# Patient Record
Sex: Male | Born: 1950 | Race: Black or African American | Hispanic: No | Marital: Single | Smoking: Former smoker
Health system: Southern US, Community
[De-identification: ages and names within clinical notes are randomized; demographics above are authoritative.]

## PROBLEM LIST (undated history)

## (undated) DIAGNOSIS — I10 Essential (primary) hypertension: Secondary | ICD-10-CM

## (undated) DIAGNOSIS — K219 Gastro-esophageal reflux disease without esophagitis: Secondary | ICD-10-CM

## (undated) DIAGNOSIS — M1990 Unspecified osteoarthritis, unspecified site: Secondary | ICD-10-CM

## (undated) DIAGNOSIS — I442 Atrioventricular block, complete: Secondary | ICD-10-CM

## (undated) DIAGNOSIS — I255 Ischemic cardiomyopathy: Secondary | ICD-10-CM

## (undated) DIAGNOSIS — E785 Hyperlipidemia, unspecified: Secondary | ICD-10-CM

## (undated) DIAGNOSIS — M549 Dorsalgia, unspecified: Secondary | ICD-10-CM

## (undated) DIAGNOSIS — I2510 Atherosclerotic heart disease of native coronary artery without angina pectoris: Secondary | ICD-10-CM

## (undated) DIAGNOSIS — I4729 Other ventricular tachycardia: Secondary | ICD-10-CM

## (undated) DIAGNOSIS — M5430 Sciatica, unspecified side: Secondary | ICD-10-CM

## (undated) DIAGNOSIS — G8929 Other chronic pain: Secondary | ICD-10-CM

## (undated) DIAGNOSIS — I472 Ventricular tachycardia: Secondary | ICD-10-CM

## (undated) HISTORY — DX: Ischemic cardiomyopathy: I25.5

## (undated) HISTORY — DX: Unspecified osteoarthritis, unspecified site: M19.90

## (undated) HISTORY — DX: Ventricular tachycardia: I47.2

## (undated) HISTORY — DX: Other ventricular tachycardia: I47.29

## (undated) HISTORY — DX: Atherosclerotic heart disease of native coronary artery without angina pectoris: I25.10

## (undated) HISTORY — PX: IMPLANTABLE CARDIOVERTER DEFIBRILLATOR IMPLANT: SHX5860

## (undated) HISTORY — PX: CORONARY ANGIOPLASTY WITH STENT PLACEMENT: SHX49

## (undated) HISTORY — DX: Dorsalgia, unspecified: M54.9

## (undated) HISTORY — PX: GALLBLADDER SURGERY: SHX652

## (undated) HISTORY — DX: Gastro-esophageal reflux disease without esophagitis: K21.9

## (undated) HISTORY — DX: Atrioventricular block, complete: I44.2

## (undated) HISTORY — DX: Hyperlipidemia, unspecified: E78.5

## (undated) HISTORY — DX: Sciatica, unspecified side: M54.30

## (undated) HISTORY — PX: CARDIAC DEFIBRILLATOR PLACEMENT: SHX171

## (undated) HISTORY — DX: Other chronic pain: G89.29

---

## 1997-07-10 ENCOUNTER — Emergency Department (HOSPITAL_COMMUNITY): Admission: EM | Admit: 1997-07-10 | Discharge: 1997-07-10 | Payer: Self-pay | Admitting: Emergency Medicine

## 1997-12-28 ENCOUNTER — Observation Stay (HOSPITAL_COMMUNITY): Admission: EM | Admit: 1997-12-28 | Discharge: 1997-12-29 | Payer: Self-pay | Admitting: Emergency Medicine

## 1997-12-28 ENCOUNTER — Encounter: Payer: Self-pay | Admitting: Emergency Medicine

## 1997-12-28 ENCOUNTER — Encounter: Payer: Self-pay | Admitting: *Deleted

## 1997-12-29 ENCOUNTER — Encounter: Payer: Self-pay | Admitting: *Deleted

## 1997-12-29 ENCOUNTER — Encounter: Payer: Self-pay | Admitting: Cardiology

## 1998-03-23 ENCOUNTER — Ambulatory Visit (HOSPITAL_COMMUNITY): Admission: RE | Admit: 1998-03-23 | Discharge: 1998-03-23 | Payer: Self-pay | Admitting: Cardiology

## 1999-03-30 ENCOUNTER — Ambulatory Visit (HOSPITAL_COMMUNITY): Admission: RE | Admit: 1999-03-30 | Discharge: 1999-03-30 | Payer: Self-pay | Admitting: Cardiology

## 1999-03-30 ENCOUNTER — Encounter: Payer: Self-pay | Admitting: Cardiology

## 1999-05-17 ENCOUNTER — Emergency Department (HOSPITAL_COMMUNITY): Admission: EM | Admit: 1999-05-17 | Discharge: 1999-05-17 | Payer: Self-pay | Admitting: Emergency Medicine

## 1999-05-18 ENCOUNTER — Emergency Department (HOSPITAL_COMMUNITY): Admission: EM | Admit: 1999-05-18 | Discharge: 1999-05-18 | Payer: Self-pay | Admitting: Emergency Medicine

## 1999-05-19 ENCOUNTER — Encounter: Payer: Self-pay | Admitting: Cardiology

## 1999-05-19 ENCOUNTER — Ambulatory Visit (HOSPITAL_COMMUNITY): Admission: RE | Admit: 1999-05-19 | Discharge: 1999-05-19 | Payer: Self-pay | Admitting: Cardiology

## 1999-07-09 ENCOUNTER — Encounter: Payer: Self-pay | Admitting: Internal Medicine

## 1999-07-09 ENCOUNTER — Ambulatory Visit (HOSPITAL_COMMUNITY): Admission: RE | Admit: 1999-07-09 | Discharge: 1999-07-09 | Payer: Self-pay | Admitting: Internal Medicine

## 1999-11-12 ENCOUNTER — Ambulatory Visit (HOSPITAL_COMMUNITY): Admission: RE | Admit: 1999-11-12 | Discharge: 1999-11-12 | Payer: Self-pay | Admitting: Cardiology

## 1999-11-12 ENCOUNTER — Encounter: Payer: Self-pay | Admitting: Cardiology

## 1999-11-23 ENCOUNTER — Ambulatory Visit (HOSPITAL_COMMUNITY): Admission: RE | Admit: 1999-11-23 | Discharge: 1999-11-23 | Payer: Self-pay | Admitting: Cardiology

## 2000-01-19 ENCOUNTER — Ambulatory Visit (HOSPITAL_COMMUNITY): Admission: RE | Admit: 2000-01-19 | Discharge: 2000-01-19 | Payer: Self-pay | Admitting: Cardiology

## 2000-04-06 ENCOUNTER — Encounter: Payer: Self-pay | Admitting: Pulmonary Disease

## 2000-04-06 ENCOUNTER — Ambulatory Visit (HOSPITAL_COMMUNITY): Admission: RE | Admit: 2000-04-06 | Discharge: 2000-04-06 | Payer: Self-pay | Admitting: Pulmonary Disease

## 2000-04-12 ENCOUNTER — Encounter: Payer: Self-pay | Admitting: Cardiology

## 2000-04-12 ENCOUNTER — Ambulatory Visit (HOSPITAL_COMMUNITY): Admission: RE | Admit: 2000-04-12 | Discharge: 2000-04-12 | Payer: Self-pay | Admitting: Cardiology

## 2000-05-10 ENCOUNTER — Encounter: Payer: Self-pay | Admitting: *Deleted

## 2000-05-10 ENCOUNTER — Encounter: Admission: RE | Admit: 2000-05-10 | Discharge: 2000-05-10 | Payer: Self-pay | Admitting: *Deleted

## 2000-06-02 ENCOUNTER — Ambulatory Visit: Admission: RE | Admit: 2000-06-02 | Discharge: 2000-06-02 | Payer: Self-pay | Admitting: Pulmonary Disease

## 2000-08-15 ENCOUNTER — Inpatient Hospital Stay (HOSPITAL_COMMUNITY): Admission: EM | Admit: 2000-08-15 | Discharge: 2000-08-22 | Payer: Self-pay | Admitting: Emergency Medicine

## 2000-08-15 ENCOUNTER — Encounter: Payer: Self-pay | Admitting: Emergency Medicine

## 2000-08-16 ENCOUNTER — Encounter: Payer: Self-pay | Admitting: Cardiology

## 2000-08-17 ENCOUNTER — Encounter: Payer: Self-pay | Admitting: Cardiology

## 2000-08-21 ENCOUNTER — Encounter: Payer: Self-pay | Admitting: Cardiology

## 2001-08-29 ENCOUNTER — Ambulatory Visit (HOSPITAL_COMMUNITY): Admission: RE | Admit: 2001-08-29 | Discharge: 2001-08-29 | Payer: Self-pay | Admitting: Cardiology

## 2001-08-29 ENCOUNTER — Encounter: Payer: Self-pay | Admitting: Cardiology

## 2001-09-14 ENCOUNTER — Encounter: Payer: Self-pay | Admitting: General Surgery

## 2001-09-14 ENCOUNTER — Encounter: Payer: Self-pay | Admitting: Emergency Medicine

## 2001-09-14 ENCOUNTER — Inpatient Hospital Stay (HOSPITAL_COMMUNITY): Admission: EM | Admit: 2001-09-14 | Discharge: 2001-09-19 | Payer: Self-pay | Admitting: Emergency Medicine

## 2001-09-15 ENCOUNTER — Encounter: Payer: Self-pay | Admitting: General Surgery

## 2001-09-17 ENCOUNTER — Encounter: Payer: Self-pay | Admitting: Cardiology

## 2001-09-27 ENCOUNTER — Ambulatory Visit (HOSPITAL_COMMUNITY): Admission: RE | Admit: 2001-09-27 | Discharge: 2001-09-27 | Payer: Self-pay | Admitting: Cardiology

## 2001-10-01 ENCOUNTER — Ambulatory Visit (HOSPITAL_COMMUNITY): Admission: RE | Admit: 2001-10-01 | Discharge: 2001-10-01 | Payer: Self-pay | Admitting: Cardiology

## 2001-10-01 ENCOUNTER — Encounter: Payer: Self-pay | Admitting: Cardiology

## 2001-10-17 ENCOUNTER — Ambulatory Visit (HOSPITAL_COMMUNITY): Admission: RE | Admit: 2001-10-17 | Discharge: 2001-10-17 | Payer: Self-pay | Admitting: *Deleted

## 2001-11-08 ENCOUNTER — Encounter: Payer: Self-pay | Admitting: General Surgery

## 2001-11-08 ENCOUNTER — Ambulatory Visit (HOSPITAL_COMMUNITY): Admission: RE | Admit: 2001-11-08 | Discharge: 2001-11-08 | Payer: Self-pay | Admitting: General Surgery

## 2001-11-12 ENCOUNTER — Ambulatory Visit (HOSPITAL_COMMUNITY): Admission: RE | Admit: 2001-11-12 | Discharge: 2001-11-12 | Payer: Self-pay | Admitting: General Surgery

## 2001-11-12 ENCOUNTER — Encounter: Payer: Self-pay | Admitting: General Surgery

## 2001-12-12 ENCOUNTER — Encounter: Payer: Self-pay | Admitting: General Surgery

## 2001-12-17 ENCOUNTER — Encounter: Payer: Self-pay | Admitting: General Surgery

## 2001-12-17 ENCOUNTER — Ambulatory Visit (HOSPITAL_COMMUNITY): Admission: RE | Admit: 2001-12-17 | Discharge: 2001-12-18 | Payer: Self-pay | Admitting: General Surgery

## 2001-12-17 ENCOUNTER — Encounter (INDEPENDENT_AMBULATORY_CARE_PROVIDER_SITE_OTHER): Payer: Self-pay | Admitting: *Deleted

## 2003-09-18 ENCOUNTER — Encounter: Admission: RE | Admit: 2003-09-18 | Discharge: 2003-09-18 | Payer: Self-pay | Admitting: Cardiology

## 2005-09-02 ENCOUNTER — Ambulatory Visit: Payer: Self-pay | Admitting: Internal Medicine

## 2005-12-08 ENCOUNTER — Ambulatory Visit: Payer: Self-pay | Admitting: Internal Medicine

## 2006-04-24 ENCOUNTER — Ambulatory Visit: Payer: Self-pay

## 2006-08-07 ENCOUNTER — Ambulatory Visit: Payer: Self-pay

## 2006-08-21 ENCOUNTER — Encounter: Admission: RE | Admit: 2006-08-21 | Discharge: 2006-08-21 | Payer: Self-pay | Admitting: Cardiology

## 2006-11-08 ENCOUNTER — Ambulatory Visit: Payer: Self-pay | Admitting: Internal Medicine

## 2006-11-16 ENCOUNTER — Ambulatory Visit: Payer: Self-pay

## 2006-11-21 ENCOUNTER — Ambulatory Visit: Payer: Self-pay | Admitting: Internal Medicine

## 2006-11-21 LAB — CONVERTED CEMR LAB
BUN: 13 mg/dL (ref 6–23)
Basophils Absolute: 0 10*3/uL (ref 0.0–0.1)
Calcium: 9.2 mg/dL (ref 8.4–10.5)
Chloride: 102 meq/L (ref 96–112)
Eosinophils Absolute: 0.2 10*3/uL (ref 0.0–0.6)
Eosinophils Relative: 2.3 % (ref 0.0–5.0)
GFR calc Af Amer: 88 mL/min
GFR calc non Af Amer: 73 mL/min
Glucose, Bld: 106 mg/dL — ABNORMAL HIGH (ref 70–99)
HCT: 38.7 % — ABNORMAL LOW (ref 39.0–52.0)
MCV: 93.4 fL (ref 78.0–100.0)
Monocytes Relative: 10.5 % (ref 3.0–11.0)
Neutro Abs: 3.9 10*3/uL (ref 1.4–7.7)
Platelets: 253 10*3/uL (ref 150–400)
RBC: 4.15 M/uL — ABNORMAL LOW (ref 4.22–5.81)
aPTT: 23.7 s (ref 21.7–29.8)

## 2006-11-23 ENCOUNTER — Ambulatory Visit: Payer: Self-pay | Admitting: Cardiology

## 2006-11-23 ENCOUNTER — Inpatient Hospital Stay (HOSPITAL_BASED_OUTPATIENT_CLINIC_OR_DEPARTMENT_OTHER): Admission: RE | Admit: 2006-11-23 | Discharge: 2006-11-23 | Payer: Self-pay | Admitting: Cardiology

## 2006-11-24 ENCOUNTER — Ambulatory Visit: Payer: Self-pay | Admitting: Cardiology

## 2006-11-24 ENCOUNTER — Emergency Department (HOSPITAL_COMMUNITY): Admission: EM | Admit: 2006-11-24 | Discharge: 2006-11-25 | Payer: Self-pay | Admitting: Emergency Medicine

## 2006-12-06 ENCOUNTER — Ambulatory Visit: Payer: Self-pay | Admitting: Internal Medicine

## 2006-12-06 LAB — CONVERTED CEMR LAB
Basophils Relative: 0.2 % (ref 0.0–1.0)
Calcium: 9.6 mg/dL (ref 8.4–10.5)
Eosinophils Relative: 3.7 % (ref 0.0–5.0)
GFR calc Af Amer: 111 mL/min
GFR calc non Af Amer: 92 mL/min
Hemoglobin: 14 g/dL (ref 13.0–17.0)
Lymphocytes Relative: 24.4 % (ref 12.0–46.0)
Neutro Abs: 4.2 10*3/uL (ref 1.4–7.7)
Platelets: 244 10*3/uL (ref 150–400)
Prothrombin Time: 12.1 s (ref 10.9–13.3)
WBC: 6.9 10*3/uL (ref 4.5–10.5)

## 2006-12-13 ENCOUNTER — Ambulatory Visit: Payer: Self-pay | Admitting: Internal Medicine

## 2006-12-13 ENCOUNTER — Ambulatory Visit (HOSPITAL_COMMUNITY): Admission: RE | Admit: 2006-12-13 | Discharge: 2006-12-13 | Payer: Self-pay | Admitting: Internal Medicine

## 2006-12-27 ENCOUNTER — Ambulatory Visit: Payer: Self-pay

## 2007-01-09 ENCOUNTER — Encounter: Admission: RE | Admit: 2007-01-09 | Discharge: 2007-01-09 | Payer: Self-pay | Admitting: Orthopedic Surgery

## 2007-03-13 ENCOUNTER — Ambulatory Visit: Payer: Self-pay | Admitting: Internal Medicine

## 2007-04-23 ENCOUNTER — Ambulatory Visit: Payer: Self-pay | Admitting: *Deleted

## 2007-04-23 ENCOUNTER — Inpatient Hospital Stay (HOSPITAL_COMMUNITY): Admission: RE | Admit: 2007-04-23 | Discharge: 2007-04-26 | Payer: Self-pay | Admitting: Neurological Surgery

## 2007-04-23 ENCOUNTER — Ambulatory Visit: Payer: Self-pay | Admitting: Cardiology

## 2007-04-23 HISTORY — PX: OTHER SURGICAL HISTORY: SHX169

## 2007-06-11 ENCOUNTER — Encounter: Admission: RE | Admit: 2007-06-11 | Discharge: 2007-06-11 | Payer: Self-pay | Admitting: Cardiology

## 2007-06-13 ENCOUNTER — Ambulatory Visit: Payer: Self-pay | Admitting: Internal Medicine

## 2007-06-20 ENCOUNTER — Inpatient Hospital Stay (HOSPITAL_COMMUNITY): Admission: EM | Admit: 2007-06-20 | Discharge: 2007-06-23 | Payer: Self-pay | Admitting: Emergency Medicine

## 2007-06-20 ENCOUNTER — Ambulatory Visit: Payer: Self-pay | Admitting: Cardiology

## 2007-06-21 ENCOUNTER — Encounter: Payer: Self-pay | Admitting: Internal Medicine

## 2007-07-09 ENCOUNTER — Ambulatory Visit: Payer: Self-pay

## 2007-08-30 ENCOUNTER — Ambulatory Visit: Payer: Self-pay | Admitting: Internal Medicine

## 2007-09-19 ENCOUNTER — Ambulatory Visit: Payer: Self-pay | Admitting: Internal Medicine

## 2007-12-05 ENCOUNTER — Ambulatory Visit: Payer: Self-pay | Admitting: Internal Medicine

## 2007-12-05 DIAGNOSIS — I2510 Atherosclerotic heart disease of native coronary artery without angina pectoris: Secondary | ICD-10-CM

## 2007-12-05 DIAGNOSIS — I442 Atrioventricular block, complete: Secondary | ICD-10-CM

## 2007-12-05 DIAGNOSIS — G7000 Myasthenia gravis without (acute) exacerbation: Secondary | ICD-10-CM

## 2007-12-05 DIAGNOSIS — I472 Ventricular tachycardia, unspecified: Secondary | ICD-10-CM

## 2007-12-05 DIAGNOSIS — I255 Ischemic cardiomyopathy: Secondary | ICD-10-CM

## 2007-12-05 HISTORY — DX: Ventricular tachycardia: I47.2

## 2007-12-05 HISTORY — DX: Myasthenia gravis without (acute) exacerbation: G70.00

## 2007-12-05 HISTORY — DX: Atherosclerotic heart disease of native coronary artery without angina pectoris: I25.10

## 2007-12-05 HISTORY — DX: Atrioventricular block, complete: I44.2

## 2007-12-05 HISTORY — DX: Ventricular tachycardia, unspecified: I47.20

## 2008-01-23 ENCOUNTER — Ambulatory Visit: Payer: Self-pay | Admitting: Internal Medicine

## 2008-02-14 ENCOUNTER — Ambulatory Visit: Payer: Self-pay | Admitting: Internal Medicine

## 2008-03-18 ENCOUNTER — Encounter: Payer: Self-pay | Admitting: Internal Medicine

## 2008-03-24 ENCOUNTER — Ambulatory Visit: Payer: Self-pay | Admitting: Internal Medicine

## 2008-03-28 ENCOUNTER — Encounter: Payer: Self-pay | Admitting: Internal Medicine

## 2008-03-29 ENCOUNTER — Other Ambulatory Visit: Payer: Self-pay | Admitting: Internal Medicine

## 2008-03-29 ENCOUNTER — Emergency Department (HOSPITAL_COMMUNITY): Admission: EM | Admit: 2008-03-29 | Discharge: 2008-03-29 | Payer: Self-pay | Admitting: Emergency Medicine

## 2008-04-02 ENCOUNTER — Telehealth (INDEPENDENT_AMBULATORY_CARE_PROVIDER_SITE_OTHER): Payer: Self-pay | Admitting: *Deleted

## 2008-04-03 ENCOUNTER — Encounter: Payer: Self-pay | Admitting: Internal Medicine

## 2008-04-03 ENCOUNTER — Ambulatory Visit: Payer: Self-pay

## 2008-04-10 ENCOUNTER — Encounter: Payer: Self-pay | Admitting: Internal Medicine

## 2008-04-10 ENCOUNTER — Ambulatory Visit: Payer: Self-pay | Admitting: Internal Medicine

## 2008-04-15 ENCOUNTER — Ambulatory Visit: Payer: Self-pay | Admitting: Internal Medicine

## 2008-04-15 LAB — CONVERTED CEMR LAB
BUN: 15 mg/dL (ref 6–23)
Basophils Absolute: 0 10*3/uL (ref 0.0–0.1)
Basophils Relative: 0.9 % (ref 0.0–3.0)
CO2: 32 meq/L (ref 19–32)
Calcium: 8.8 mg/dL (ref 8.4–10.5)
Chloride: 108 meq/L (ref 96–112)
Creatinine, Ser: 1.1 mg/dL (ref 0.4–1.5)
Eosinophils Absolute: 0.2 10*3/uL (ref 0.0–0.7)
HCT: 37.5 % — ABNORMAL LOW (ref 39.0–52.0)
Hemoglobin: 12.6 g/dL — ABNORMAL LOW (ref 13.0–17.0)
INR: 1.1 — ABNORMAL HIGH (ref 0.8–1.0)
Lymphs Abs: 1.1 10*3/uL (ref 0.7–4.0)
MCHC: 33.5 g/dL (ref 30.0–36.0)
Monocytes Relative: 11.5 % (ref 3.0–12.0)
Neutro Abs: 3 10*3/uL (ref 1.4–7.7)
RBC: 3.99 M/uL — ABNORMAL LOW (ref 4.22–5.81)
RDW: 13.6 % (ref 11.5–14.6)
WBC: 4.9 10*3/uL (ref 4.5–10.5)

## 2008-04-17 ENCOUNTER — Inpatient Hospital Stay (HOSPITAL_BASED_OUTPATIENT_CLINIC_OR_DEPARTMENT_OTHER): Admission: RE | Admit: 2008-04-17 | Discharge: 2008-04-17 | Payer: Self-pay | Admitting: Cardiology

## 2008-04-17 ENCOUNTER — Ambulatory Visit: Payer: Self-pay | Admitting: Cardiology

## 2008-04-21 ENCOUNTER — Ambulatory Visit: Payer: Self-pay | Admitting: Internal Medicine

## 2008-04-21 LAB — CONVERTED CEMR LAB
GFR calc non Af Amer: 110.73 mL/min (ref >60)
Glucose, Bld: 113 mg/dL — ABNORMAL HIGH (ref 70–99)
Potassium: 4.3 meq/L (ref 3.5–5.1)
Sodium: 141 meq/L (ref 135–145)

## 2008-04-26 ENCOUNTER — Encounter: Payer: Self-pay | Admitting: Internal Medicine

## 2008-05-08 ENCOUNTER — Ambulatory Visit: Payer: Self-pay | Admitting: Internal Medicine

## 2008-05-08 DIAGNOSIS — I5022 Chronic systolic (congestive) heart failure: Secondary | ICD-10-CM

## 2008-05-08 HISTORY — DX: Chronic systolic (congestive) heart failure: I50.22

## 2008-05-08 LAB — CONVERTED CEMR LAB
BUN: 12 mg/dL (ref 6–23)
Basophils Absolute: 0 10*3/uL (ref 0.0–0.1)
CO2: 31 meq/L (ref 19–32)
Calcium: 9.2 mg/dL (ref 8.4–10.5)
Creatinine, Ser: 1 mg/dL (ref 0.4–1.5)
Eosinophils Absolute: 0.2 10*3/uL (ref 0.0–0.7)
GFR calc non Af Amer: 98.04 mL/min (ref >60)
Glucose, Bld: 100 mg/dL — ABNORMAL HIGH (ref 70–99)
INR: 1.1 — ABNORMAL HIGH (ref 0.8–1.0)
Lymphocytes Relative: 32.3 % (ref 12.0–46.0)
Lymphs Abs: 1.4 10*3/uL (ref 0.7–4.0)
MCHC: 34.2 g/dL (ref 30.0–36.0)
Monocytes Relative: 10.4 % (ref 3.0–12.0)
Neutro Abs: 2.3 10*3/uL (ref 1.4–7.7)
Neutrophils Relative %: 52.1 % (ref 43.0–77.0)
Platelets: 211 10*3/uL (ref 150.0–400.0)
Prothrombin Time: 11.8 s (ref 10.9–13.3)
RDW: 13.3 % (ref 11.5–14.6)
Sodium: 140 meq/L (ref 135–145)
aPTT: 31 s — ABNORMAL HIGH

## 2008-05-12 ENCOUNTER — Ambulatory Visit: Payer: Self-pay | Admitting: Internal Medicine

## 2008-05-12 ENCOUNTER — Ambulatory Visit (HOSPITAL_COMMUNITY): Admission: RE | Admit: 2008-05-12 | Discharge: 2008-05-12 | Payer: Self-pay | Admitting: Internal Medicine

## 2008-05-26 ENCOUNTER — Ambulatory Visit: Payer: Self-pay

## 2008-07-17 ENCOUNTER — Ambulatory Visit: Payer: Self-pay | Admitting: Internal Medicine

## 2008-07-17 ENCOUNTER — Inpatient Hospital Stay (HOSPITAL_COMMUNITY): Admission: EM | Admit: 2008-07-17 | Discharge: 2008-07-21 | Payer: Self-pay | Admitting: Emergency Medicine

## 2008-07-18 ENCOUNTER — Encounter (INDEPENDENT_AMBULATORY_CARE_PROVIDER_SITE_OTHER): Payer: Self-pay | Admitting: Cardiology

## 2008-08-12 ENCOUNTER — Encounter: Payer: Self-pay | Admitting: Internal Medicine

## 2008-08-12 ENCOUNTER — Ambulatory Visit: Payer: Self-pay | Admitting: Internal Medicine

## 2008-08-13 ENCOUNTER — Encounter: Payer: Self-pay | Admitting: Internal Medicine

## 2008-08-14 ENCOUNTER — Telehealth: Payer: Self-pay | Admitting: Internal Medicine

## 2008-08-26 ENCOUNTER — Ambulatory Visit: Payer: Self-pay | Admitting: Internal Medicine

## 2008-09-04 ENCOUNTER — Ambulatory Visit: Payer: Self-pay

## 2008-09-04 ENCOUNTER — Encounter: Payer: Self-pay | Admitting: Internal Medicine

## 2008-09-19 ENCOUNTER — Telehealth (INDEPENDENT_AMBULATORY_CARE_PROVIDER_SITE_OTHER): Payer: Self-pay | Admitting: *Deleted

## 2008-10-01 ENCOUNTER — Emergency Department (HOSPITAL_COMMUNITY): Admission: EM | Admit: 2008-10-01 | Discharge: 2008-10-01 | Payer: Self-pay | Admitting: Emergency Medicine

## 2008-12-16 ENCOUNTER — Ambulatory Visit: Payer: Self-pay | Admitting: Internal Medicine

## 2009-01-16 ENCOUNTER — Encounter: Admission: RE | Admit: 2009-01-16 | Discharge: 2009-01-16 | Payer: Self-pay | Admitting: Cardiology

## 2009-03-17 ENCOUNTER — Ambulatory Visit: Payer: Self-pay | Admitting: Internal Medicine

## 2009-03-23 ENCOUNTER — Encounter: Payer: Self-pay | Admitting: Internal Medicine

## 2009-04-29 ENCOUNTER — Telehealth: Payer: Self-pay | Admitting: Internal Medicine

## 2009-04-30 ENCOUNTER — Ambulatory Visit: Payer: Self-pay

## 2009-04-30 ENCOUNTER — Encounter: Payer: Self-pay | Admitting: Internal Medicine

## 2009-05-05 ENCOUNTER — Ambulatory Visit: Payer: Self-pay | Admitting: Internal Medicine

## 2009-05-07 ENCOUNTER — Telehealth (INDEPENDENT_AMBULATORY_CARE_PROVIDER_SITE_OTHER): Payer: Self-pay | Admitting: *Deleted

## 2009-05-11 ENCOUNTER — Ambulatory Visit: Payer: Self-pay

## 2009-05-11 ENCOUNTER — Encounter (HOSPITAL_COMMUNITY): Admission: RE | Admit: 2009-05-11 | Discharge: 2009-07-03 | Payer: Self-pay | Admitting: Internal Medicine

## 2009-05-11 ENCOUNTER — Ambulatory Visit: Payer: Self-pay | Admitting: Internal Medicine

## 2009-05-11 ENCOUNTER — Encounter: Payer: Self-pay | Admitting: Internal Medicine

## 2009-06-02 ENCOUNTER — Ambulatory Visit: Payer: Self-pay

## 2009-06-02 ENCOUNTER — Ambulatory Visit (HOSPITAL_COMMUNITY): Admission: RE | Admit: 2009-06-02 | Discharge: 2009-06-02 | Payer: Self-pay | Admitting: Internal Medicine

## 2009-06-02 ENCOUNTER — Encounter: Payer: Self-pay | Admitting: Internal Medicine

## 2009-06-02 ENCOUNTER — Ambulatory Visit: Payer: Self-pay | Admitting: Cardiology

## 2009-06-05 ENCOUNTER — Telehealth: Payer: Self-pay | Admitting: Internal Medicine

## 2009-06-11 IMAGING — CR DG CHEST 1V PORT
1 series · 1 of 1 positions shown · non-contrast
Comparison: 04/23/2007

CLINICAL DATA: Chest pain.  Bulging vein right side of neck.

PORTABLE CHEST - 1 VIEW

[AP]
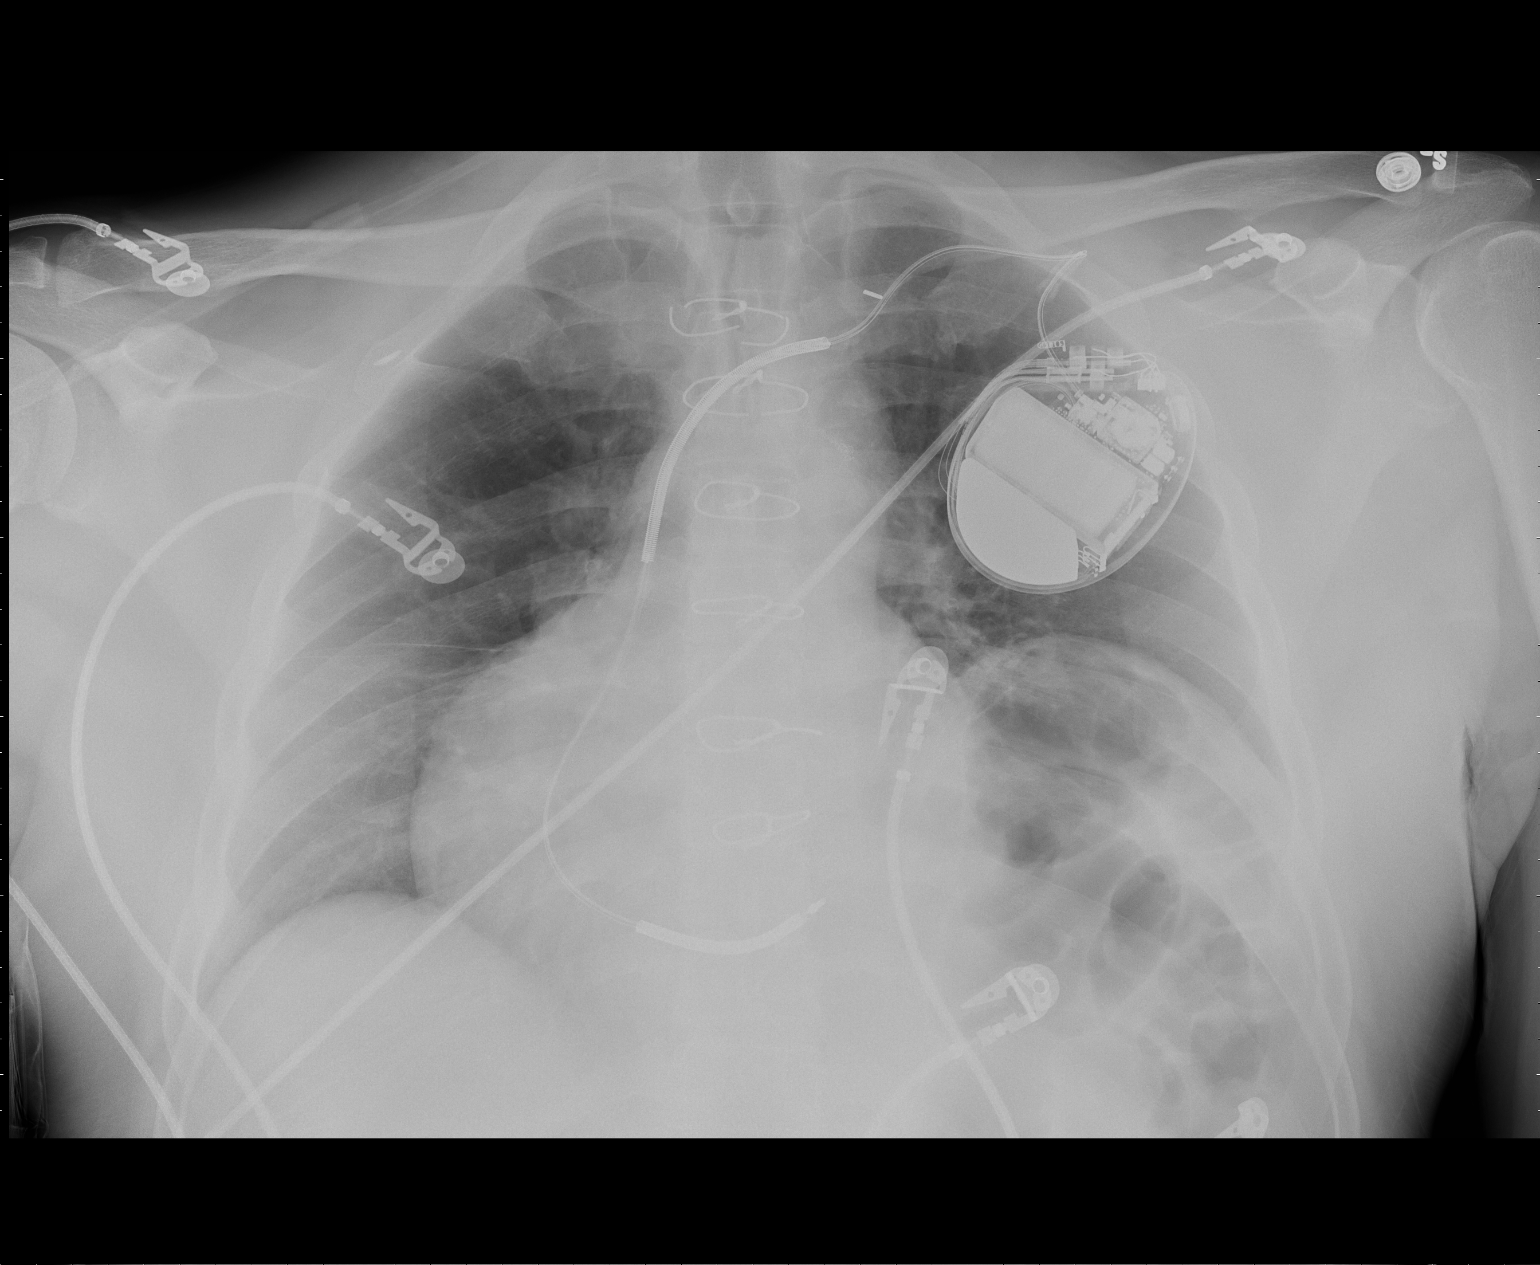

[1 of 1 positions shown; findings below may reference images not displayed]

FINDINGS: Median sternotomy with broken superior 2 mediastinal
wires, stable.  Chronic elevation of the right hemidiaphragm, today
with gas distended colon underneath.  Stable rightward
cardiomediastinal silhouette shift.  Pacemaker appears unchanged.
Compared to the prior chest radiograph, support apparatus is been
removed.  No airspace disease is identified.  There is no
radiographic evidence of paratracheal mass to account for neck vein
bulging.  No pulmonary edema or pulmonary venous congestion is
identified.
IMPRESSION: 1.  Chronic changes of the chest without acute cardiopulmonary
disease.
2.  Stable rightward cardiopericardial silhouette shift due to
elevation of the left hemidiaphragm.
3.  Stable postsurgical and postprocedural changes.

## 2009-07-01 ENCOUNTER — Emergency Department (HOSPITAL_COMMUNITY): Admission: EM | Admit: 2009-07-01 | Discharge: 2009-07-01 | Payer: Self-pay | Admitting: Family Medicine

## 2009-08-18 ENCOUNTER — Ambulatory Visit: Payer: Self-pay | Admitting: Internal Medicine

## 2009-08-18 DIAGNOSIS — R5383 Other fatigue: Secondary | ICD-10-CM

## 2009-08-18 DIAGNOSIS — R5381 Other malaise: Secondary | ICD-10-CM

## 2009-08-18 LAB — CONVERTED CEMR LAB
BUN: 13 mg/dL (ref 6–23)
CO2: 28 meq/L (ref 19–32)
Calcium: 8.9 mg/dL (ref 8.4–10.5)
Creatinine, Ser: 0.9 mg/dL (ref 0.4–1.5)
Glucose, Bld: 88 mg/dL (ref 70–99)
Sodium: 140 meq/L (ref 135–145)

## 2009-11-05 ENCOUNTER — Ambulatory Visit: Payer: Self-pay

## 2009-11-05 ENCOUNTER — Encounter: Payer: Self-pay | Admitting: Internal Medicine

## 2010-02-02 NOTE — Assessment & Plan Note (Signed)
Summary: defib [email or web address]  Medications Added * FISH OIL 1 TAB once daily      Allergies Added: NKDA  Visit Type:  Pacemaker check Primary Provider:  Dr Shana Chute  CC:  FATIGUE.  History of Present Illness: Mr [name] is seen in followup for ventricular tachycardia in the setting of ischemic and nonischemic heart disease. He is status post ICD implantation.  In the past he has significant symptoms of heart failure which had prompted [place] to consider CRT. Endovascular CS access failed. His symptoms improved significantly obviating the  issue of thoracotomy placement of the LV lead  over recent months or so he has noted increasing problems with exercise tolerance and fatigue which he thinks may relate to excessive activity the day before. There has been no accompanying chest pain or peripheral edema.  At his last visit we ended up doing a variety of things to reprogram to prevent mode switching and subsequent ventricular pacing with loss of atrial pacing secondary to inappropriate mode switch. He       Current Medications (verified): 1)  Grapefruit Seed 60 % Extr (Grapefruit Seed) .... Tid 2)  Imuran 50 Mg Tabs (Azathioprine) .... Take 3 Tablet Daily 3)  Sotalol Hcl 160 Mg Tabs (Sotalol Hcl) .... One By Mouth Every 12 Hours 4)  Nexium 40 Mg Cpdr (Esomeprazole Magnesium) .... By Mouth Once Daily 5)  Furosemide 40 Mg Tabs (Furosemide) .... One By Mouth Daily 6)  Simvastatin 20 Mg Tabs (Simvastatin) .... One By Mouth Daily 7)  Ramipril 5 Mg Caps (Ramipril) .... One By Mouth Daily 8)  K-Lor 20 Meq Pack (Potassium Chloride) .... One By Mouth Daily 9)  Fish Oil .Marland Kitchen.. 1 Tab Once Daily  Allergies (verified): No Known Drug Allergies  Past History:  Past Medical History: Last updated: 08/11/2008 CAD LAD stenting Ventricular Tachycardia Heart Block ICD-Dual Chamber (S/P) recently reimplanted on the Right with the previous Left sided device NOT explanted--Medtronic Concerto C 154  DWR Myasthenia Gravis  Vital Signs:  Patient profile:   59 year old male Height:      68 inches Weight:      184 pounds Pulse rate:   76 / minute BP sitting:   110 / 76  (left arm) Cuff size:   large  Vitals Entered By: Burnett Kanaris, CNA (May 05, 2009 3:48 PM)  Physical Exam  General:  The patient was alert and oriented in no acute distress. HEENT Normal.  Neck veins were flat, carotids were brisk.  Lungs were clear.  Heart sounds were regular without murmurs or gallops.  Abdomen was soft with active bowel sounds. There is no clubbing cyanosis or edema. Skin Warm and dry     ICD Specifications Following MD:  Sherryl Manges, MD     ICD Vendor:  Medtronic     ICD Model Number:  [phone]     ICD Serial Number:  [id number] ICD DOI:  06/22/2007     ICD Implanting MD:  Sherryl Manges, MD  Lead 1:    Location: RA     DOI: 06/22/2007     Model #: 1478     Serial #: [id number]     Status: active Lead 2:    Location: RV     DOI: 06/22/2007     Model #: 7120     Serial #: [id number]     Status: active Lead 3:    Location: RV     DOI: 11/02/1995     Model #: 9528  Serial #: [id number]     Status: capped  Indications::  CHB,ICM GUIDANT PRIZM ICD ON LEFT PROGRAMMED ODO   ICD Follow Up ICD Dependent:  Yes      Episodes Coumadin:  No  Brady Parameters Mode DDD     Lower Rate Limit:  70     Upper Rate Limit 130 PAV 130     Sensed AV Delay:  100  Tachy Zones VF:  200     VT:  240 FVT VIA VF     VT1:  150     Impression & Recommendations:  Problem # 1:  COMBINED HEART FAILURE, 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 Daigle has complaints consistent with heart failure. He will plan to try to temper his activity over the next couple of months. In this patient her next visit we'll undertake a Myoview scan both to look at left ventricular function as well as coronary perfusion. His updated medication list for this problem includes:    Ramipril 5 Mg Caps (Ramipril) ..... One by mouth daily  His  updated medication list for this problem includes:    Ramipril 5 Mg Caps (Ramipril) ..... One by mouth daily  Orders: Nuclear Stress Test (Nuc Stress Test)  Problem # 2:  AV BLOCK, COMPLETE (ICD-426.0)  he is 100% ventricularly paced.  His updated medication list for this problem includes:    Ramipril 5 Mg Caps (Ramipril) ..... One by mouth daily  Orders: Nuclear Stress Test (Nuc Stress Test)  Problem # 3:  VENTRICULAR TACHYCARDIA (ICD-427.1)  There is been no intercurrent ventricular tachycardia His updated medication list for this problem includes:    Ramipril 5 Mg Caps (Ramipril) ..... One by mouth daily  His updated medication list for this problem includes:    Ramipril 5 Mg Caps (Ramipril) ..... One by mouth daily  Orders: Nuclear Stress Test (Nuc Stress Test)  Problem # 4:  IMPLANTATION OF DEFIBRILLATOR,MEDTRONIC CONCERTO C 154 DWR (ICD-V45.02)  Device parameters and data were reviewed and no changes were made  Orders: Nuclear Stress Test (Nuc Stress Test)  Patient Instructions: 1)  Your physician recommends that you schedule a follow-up appointment in: 3 months with Dr Graciela Husbands 2)  Your physician has requested that you have an exercise stress myoview.  For further information please visit [email or web address]  Please follow instruction sheet, as given.

## 2010-02-02 NOTE — Assessment & Plan Note (Signed)
Summary: Cardiology Nuclear Study  Nuclear Med Background Indications for Stress Test: Evaluation for Ischemia, Stent Patency  Indications Comments: .  History: Defibrillator, Echo, Heart Catheterization, Myocardial Infarction, Myocardial Perfusion Study, Pacemaker, Stents  History Comments: '97 MI >Stent-LAD; '09 AICD; 4/10 CVE:LFYBO infero-septal infarct, no ischemia, EF=52%; 4/10 Cath:N/O CAD, EF=55%; 7/10 Echo:EF=40-50%; ;H/O CHF  Symptoms: DOE, Fatigue    Nuclear Pre-Procedure Cardiac Risk Factors: Family History - CAD, History of Smoking, LBBB, Lipids Caffeine/Decaff Intake: None NPO After: 10:00 PM Lungs: Clear.  O2 Sat 98% on RA. IV 0.9% NS with Angio Cath: 20g     IV Site: (R) AC IV Started by: Irean Hong RN Chest Size (in) 42     Height (in): 68 Weight (lb): 178 BMI: 27.16  Nuclear Med Study 1 or 2 day study:  1 day     Stress Test Type:  Adenosine Reading MD:  Dietrich Pates, MD     Referring MD:  Berton Mount, MD Resting Radionuclide:  Technetium 85m Tetrofosmin     Resting Radionuclide Dose:  11 mCi  Stress Radionuclide:  Technetium 41m Tetrofosmin     Stress Radionuclide Dose:  33 mCi   Stress Protocol  Dose of Adenosine:  45.3 mg    Stress Test Technologist:  [employer]a [employer] CMA-N     Nuclear Technologist:  Domenic Polite CNMT  Rest Procedure  Myocardial perfusion imaging was performed at rest 45 minutes following the intravenous administration of Myoview Technetium 39m Tetrofosmin.  Stress Procedure  The patient received IV adenosine at 140 mcg/kg/min for 4 minutes. There were no significant changes with infusion. He did c/o throat tightness with infusion.  Myoview was injected at the 2 minute mark and quantitative spect images were obtained after a 45 minute delay.  QPS Raw Data Images:  Soft tissue (diaphragm, bowel activity) underlie heart. Stress Images:  defect in the inferoseptal wall (base, mid,); anteroseptal wall (base, mid) and apex.  Otherwise  normal perfusion. Rest Images:  Improvemetn in the anteroseptal region (mid); minimally in the inferoseptal region (mid0 Subtraction (SDS):  No significant ischemia. Transient Ischemic Dilatation:  1.04  (Normal <1.22)  Lung/Heart Ratio:  .30  (Normal <0.45)  Quantitative Gated Spect Images QGS EDV:  109 ml QGS ESV:  63 ml QGS EF:  42 %   Overall Impression  Exercise Capacity: Adenosine study with no exercise. BP Response: Normal blood pressure response. Clinical Symptoms: No chest pain ECG Impression: Nondiagnostic because of baseline changes. Overall Impression: Scar in the inferoseptal wall (base, mid); scar with minimal periinfarct ischemia in the anteroseptal region.   Overall Impression Comments: Myoview with scar in the inferoseptal wall (base, mid); scar with minimal periinfarct ischemia in the anteroseptal wall (mid) and scar in the apex.  No significant ishcemia.  LVEF calculated at 42% with hypokinesis of the inferior, septal and apical walls.   Since previous study of April 2010, anteroseptal defect is more prominent. Would consider echo if not done to define EF better.  Appended Document: Orders Update Dr Graciela Husbands reviewed with Dr Tenny Craw, plan echo to better assess LVEF. Echo scheduled for 06-02-09 at 11:30 AM.  Pt notified.   Clinical Lists Changes  Orders: Added new Referral order of Echocardiogram (Echo) - Signed

## 2010-02-02 NOTE — Cardiovascular Report (Signed)
Summary: Office Visit Remote   Office Visit Remote   Imported By: Roderic Ovens 03/23/2009 14:06:11  _____________________________________________________________________  External Attachment:    Type:   Image     Comment:   External Document

## 2010-02-02 NOTE — Cardiovascular Report (Signed)
Summary: Office Visit   Office Visit   Imported By: Roderic Ovens 08/19/2009 11:56:33  _____________________________________________________________________  External Attachment:    Type:   Image     Comment:   External Document

## 2010-02-02 NOTE — Procedures (Signed)
Summary: per check out/sf      Allergies Added: NKDA  Current Medications (verified): 1)  Grapefruit Seed 60 % Extr (Grapefruit Seed) .... Tid 2)  Imuran 50 Mg Tabs (Azathioprine) .... Take 3 Tablet Daily 3)  Sotalol Hcl 160 Mg Tabs (Sotalol Hcl) .... One By Mouth Every 12 Hours 4)  Nexium 40 Mg Cpdr (Esomeprazole Magnesium) .... By Mouth Once Daily 5)  Simvastatin 20 Mg Tabs (Simvastatin) .... One By Mouth Daily 6)  Ramipril 5 Mg Caps (Ramipril) .... One By Mouth Daily 7)  K-Lor 20 Meq Pack (Potassium Chloride) .... One By Mouth Daily 8)  Fish Oil .Marland Kitchen.. 1 Tab Once Daily 9)  Furosemide 40 Mg Tabs (Furosemide) .Marland Kitchen.. 1 and One Half Tabs Every Day 10)  Aspirin 325 Mg  Tabs (Aspirin) .Marland Kitchen.. 1 By Mouth Daily  Allergies (verified): No Known Drug Allergies   ICD Specifications Following MD:  Sherryl Manges, MD     ICD Vendor:  Medtronic     ICD Model Number:  [id number]     ICD Serial Number:  [id number] ICD DOI:  06/22/2007     ICD Implanting MD:  Sherryl Manges, MD  Lead 1:    Location: RA     DOI: 06/22/2007     Model #: 1478     Serial #: [id number]     Status: active Lead 2:    Location: RV     DOI: 06/22/2007     Model #: 7120     Serial #: [id number]     Status: active Lead 3:    Location: RV     DOI: 11/02/1995     Model #: 9528     Serial #: [id number]     Status: capped  Indications::  CHB,ICM GUIDANT PRIZM ICD ON LEFT PROGRAMMED ODO   ICD Follow Up Remote Check?  No Battery Voltage:  3.09 V     Charge Time:  9.5 seconds     Underlying rhythm:  dependent ICD Dependent:  Yes       ICD Device Measurements Atrium:  Amplitude: 1.7 mV, Impedance: 352 ohms, Threshold: 0.5 V at 0.6 msec Right Ventricle:  Impedance: 376 ohms, Threshold: 0.5 V at 0.4 msec Shock Impedance: 41/47 ohms   Episodes MS Episodes:  0     Percent Mode Switch:  0     Coumadin:  No Shock:  0     ATP:  0     Nonsustained:  0     Atrial Pacing:  9.6%     Ventricular Pacing:  99.9%  Brady Parameters Mode DDD      Lower Rate Limit:  70     Upper Rate Limit 130 PAV 130     Sensed AV Delay:  100  Tachy Zones VF:  200     VT:  240 FVT VIA VF     VT1:  150     Next Cardiology Appt Due:  02/03/2010 Tech Comments:  No parameter changes.  Device function normal.  Optivol and thoracic impedance normal.  ROV 3 months clinic. Altha Harm, LPN  November 05, 2009 9:30 AM

## 2010-02-02 NOTE — Progress Notes (Signed)
Summary: request call back/tired   Phone Note Call from Patient Call back at Home Phone [phone]   Caller: Patient Reason for Call: Talk to Nurse Summary of Call: came to ofc 2 wks ago, was told by the front desk that someone would call him the next day because Dr Graciela Husbands or his nurse was not in, he has not heard anything...Marland KitchenMarland Kitchen states he is every tired and the last time this happened his defib was not working  Initial call taken by: Migdalia Dk,  April 29, 2009 4:14 PM  Follow-up for Phone Call        Spoke with pt. Pt states he has been very tired. Pt. c/o with SOB with mild activity. Pt. thinks could be de ICD not working. Pt. has ao appointment for ICD check in the [facility]  tomorrow 04/30/09 at 9:00 AM. Pt. aware. Follow-up by: Ollen Gross, RN, BSN,  April 29, 2009 5:03 PM

## 2010-02-02 NOTE — Progress Notes (Signed)
Summary: Nuclear Pre-Procedure  Phone Note Outgoing Call Call back at Home Phone [phone]   Call placed by: Stanton Kidney, EMT-P,  May 07, 2009 1:58 PM Action Taken: Phone Call Completed Summary of Call: Left message with information on Myoview Information Sheet (see scanned document for details).     Nuclear Med Background Indications for Stress Test: Evaluation for Ischemia  Indications Comments: .  History: Defibrillator, Echo, Heart Catheterization, Myocardial Infarction, Myocardial Perfusion Study, Pacemaker, Stents  History Comments: '97 MI > Stent-LAD 6/09 Defibrillator 4/10 MPS: EF=52%, (-) ischemia 4/10 Heart Cath: EF=55%, N/O CAD 7/10 Echo: EF=40-50% ;H/O CHF  Symptoms: Fatigue    Nuclear Pre-Procedure Cardiac Risk Factors: Family History - CAD, History of Smoking, Lipids, Overweight Height (in): 68  Nuclear Med Study Referring MD:  Berton Mount, MD

## 2010-02-02 NOTE — Cardiovascular Report (Signed)
Summary: Office Visit   Office Visit   Imported By: Roderic Ovens 11/12/2009 14:24:51  _____________________________________________________________________  External Attachment:    Type:   Image     Comment:   External Document

## 2010-02-02 NOTE — Progress Notes (Signed)
Summary: returning call  Medications Added FUROSEMIDE 40 MG TABS (FUROSEMIDE) 1 and one half tabs every day       Phone Note Call from Patient Call back at Home Phone [phone]   Caller: Patient Reason for Call: Talk to Nurse Summary of Call: returning call Initial call taken by: Migdalia Dk,  June 05, 2009 11:49 AM  Follow-up for Phone Call        Called patient back.. per Dr.Kaedyn Belardo, he will increase Lasix to 60 mg every day and check a bmet on 06/12/2009. He will let us know if his SOB improves with increased dose. Follow-up by: J REISS RN    New/Updated Medications: FUROSEMIDE 40 MG TABS (FUROSEMIDE) 1 and one half tabs every day Prescriptions: FUROSEMIDE 40 MG TABS (FUROSEMIDE) 1 and one half tabs every day  #60 x 6   Entered by:   Layne Benton, RN, BSN   Authorized by:   Nathen May, MD, [facility]   Signed by:   Layne Benton, RN, BSN on 06/05/2009   Method used:   Electronically to        [facility] [phone]* (retail)       [address]       [place], [place]  [address]       Ph: [phone]       Fax: [phone]   RxID:   [id number]

## 2010-02-02 NOTE — Procedures (Signed)
Summary: Tired, SOB w/ mild activity/nm      Allergies Added: NKDA  Current Medications (verified): 1)  Grapefruit Seed 60 % Extr (Grapefruit Seed) .... Tid 2)  Imuran 50 Mg Tabs (Azathioprine) .... Take 3 Tablet Daily 3)  Sotalol Hcl 160 Mg Tabs (Sotalol Hcl) .... One By Mouth Every 12 Hours 4)  Nexium 40 Mg Cpdr (Esomeprazole Magnesium) .... By Mouth Once Daily 5)  Furosemide 40 Mg Tabs (Furosemide) .... One By Mouth Daily 6)  Simvastatin 20 Mg Tabs (Simvastatin) .... One By Mouth Daily 7)  Ramipril 5 Mg Caps (Ramipril) .... One By Mouth Daily 8)  K-Lor 20 Meq Pack (Potassium Chloride) .... One By Mouth Daily  Allergies (verified): No Known Drug Allergies   ICD Specifications Following MD:  Sherryl Manges, MD     ICD Vendor:  Medtronic     ICD Model Number:  [phone]     ICD Serial Number:  [id number] ICD DOI:  06/22/2007     ICD Implanting MD:  Sherryl Manges, MD  Lead 1:    Location: RA     DOI: 06/22/2007     Model #: 1478     Serial #: [id number]     Status: active Lead 2:    Location: RV     DOI: 06/22/2007     Model #: 7120     Serial #: [id number]     Status: active Lead 3:    Location: RV     DOI: 11/02/1995     Model #: 9528     Serial #: [id number]     Status: capped  Indications::  CHB,ICM GUIDANT PRIZM ICD ON LEFT PROGRAMMED ODO   ICD Follow Up Remote Check?  No ICD Dependent:  Yes      Episodes MS Episodes:  0     Coumadin:  No Shock:  0     ATP:  0     Nonsustained:  0      Brady Parameters Mode DDD     Lower Rate Limit:  70     Upper Rate Limit 130 PAV 130     Sensed AV Delay:  100  Tachy Zones VF:  200     VT:  240 FVT VIA VF     VT1:  150     Tech Comments:  The patient was seen as an add on for extreme fatigue for the last month.  Device function normal.  Optivol and thoracic impedance normal.  To see Dr. Graciela Husbands 05/05/2009. Altha Harm, LPN  April 30, 2009 9:34 AM

## 2010-02-02 NOTE — Letter (Signed)
Summary: Remote Device Check  [facility], Main Office  [address]   [place], [place] [address]   Phone: [phone]  Fax: [phone]     March 23, 2009 MRN: [id number]   A[facility]GUY[facility] [address] [place], [place]  [address]   Dear Mr. HIGHFILL,   Your remote transmission was recieved and reviewed by your physician.  All diagnostics were within normal limits for you.    ___X___Your next office visit is scheduled for:  JUNE 2011 WITH DR Graciela Husbands. Please call our office to schedule an appointment.    Sincerely,  Proofreader

## 2010-02-02 NOTE — Cardiovascular Report (Signed)
Summary: Office Visit   Office Visit   Imported By: Roderic Ovens 05/08/2009 14:30:12  _____________________________________________________________________  External Attachment:    Type:   Image     Comment:   External Document

## 2010-02-02 NOTE — Assessment & Plan Note (Signed)
Summary: pc2  Medications Added ASPIRIN 325 MG  TABS (ASPIRIN) 1 by mouth daily      Allergies Added: NKDA  Primary Provider:  Dr Shana Chute   History of Present Illness: [name] is seen in followup for ventricular tachycardia in the setting of ischemic and nonischemic heart disease. He is status post ICD implantation.  At his last visit, because of symptoms of progressive shortness of breath, he underwent evaluation including an echo demonstrating an ejection fraction of 40% or so and his Myoview scan demonstrated no ischemia. He continues to complain of fatigue. He denies significant snoring. He does have some daytime somnolence however.     Current Medications (verified): 1)  Grapefruit Seed 60 % Extr (Grapefruit Seed) .... Tid 2)  Imuran 50 Mg Tabs (Azathioprine) .... Take 3 Tablet Daily 3)  Sotalol Hcl 160 Mg Tabs (Sotalol Hcl) .... One By Mouth Every 12 Hours 4)  Nexium 40 Mg Cpdr (Esomeprazole Magnesium) .... By Mouth Once Daily 5)  Simvastatin 20 Mg Tabs (Simvastatin) .... One By Mouth Daily 6)  Ramipril 5 Mg Caps (Ramipril) .... One By Mouth Daily 7)  K-Lor 20 Meq Pack (Potassium Chloride) .... One By Mouth Daily 8)  Fish Oil .Marland Kitchen.. 1 Tab Once Daily 9)  Furosemide 40 Mg Tabs (Furosemide) .Marland Kitchen.. 1 and One Half Tabs Every Day 10)  Aspirin 325 Mg  Tabs (Aspirin) .Marland Kitchen.. 1 By Mouth Daily  Allergies (verified): No Known Drug Allergies  Past History:  Past Medical History: Last updated: 08/11/2008 CAD LAD stenting Ventricular Tachycardia Heart Block ICD-Dual Chamber (S/P) recently reimplanted on the Right with the previous Left sided device NOT explanted--Medtronic Concerto C 154 DWR Myasthenia Gravis  Vital Signs:  Patient profile:   60 year old male Height:      68 inches Weight:      183 pounds BMI:     27.93 Pulse rate:   80 / minute Resp:     16 per minute BP sitting:   122 / 78  (left arm)  Vitals Entered By: Marrion Coy, CNA (August 18, 2009 9:51  AM)  Physical Exam  General:  The patient was alert and oriented in no acute distress. HEENT Normal.  Neck veins were flat, carotids were brisk.  Lungs were clear.  Heart sounds were regular without murmurs or gallops.  Abdomen was soft with active bowel sounds. There is no clubbing cyanosis or edema. Skin Warm and dry     ICD Specifications Following MD:  Sherryl Manges, MD     ICD Vendor:  Medtronic     ICD Model Number:  [phone]     ICD Serial Number:  [id number] ICD DOI:  06/22/2007     ICD Implanting MD:  Sherryl Manges, MD  Lead 1:    Location: RA     DOI: 06/22/2007     Model #: 1478     Serial #: [id number]     Status: active Lead 2:    Location: RV     DOI: 06/22/2007     Model #: 7120     Serial #: [id number]     Status: active Lead 3:    Location: RV     DOI: 11/02/1995     Model #: 9528     Serial #: [id number]     Status: capped  Indications::  CHB,ICM GUIDANT PRIZM ICD ON LEFT PROGRAMMED ODO   ICD Follow Up Battery Voltage:  3.10 V     Charge Time:  9.5 seconds     Underlying rhythm:  PACED ICD Dependent:  Yes       ICD Device Measurements Atrium:  Amplitude: 1.1 mV, Impedance: 364 ohms, Threshold: 1.0 V at 0.6 msec Right Ventricle:  Amplitude: PACED mV, Impedance: 384 ohms, Threshold: 0.5 V at 0.4 msec Left Ventricle:  Impedance: >2500 ohms,  Shock Impedance: 42/51 ohms   Episodes MS Episodes:  0     Coumadin:  No Shock:  0     ATP:  0     Nonsustained:  0     Atrial Therapies:  0 Atrial Pacing:  10.7%     Ventricular Pacing:  99.8%  Brady Parameters Mode DDD     Lower Rate Limit:  70     Upper Rate Limit 130 PAV 130     Sensed AV Delay:  100  Tachy Zones VF:  200     VT:  240 FVT VIA VF     VT1:  150     Next Cardiology Appt Due:  11/03/2009 Tech Comments:  NORMAL DEVICE FUNCTION.  NO EPISODES SINCE LAST CHECK.  TURNED 1:1 SVT ON AND CHANGED RA OUTPUT FROM 2.5 TO 2.0 V.  ROV IN 3 MTHS W/DEVICE CLINIC. Vella Kohler  August 18, 2009 10:01 AM  Impression &  Recommendations:  Problem # 1:  VENTRICULAR TACHYCARDIA (ICD-427.1)  no intercurrent ventricular tachycardia; will continue him on his sotalol;  we will check his potassium and magnesium today.  His updated medication list for this problem includes:    Ramipril 5 Mg Caps (Ramipril) ..... One by mouth daily    Aspirin 325 Mg Tabs (Aspirin) .Marland Kitchen... 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 I'm not sure whether his fatigue his heart failure although might be. There no medications I think we can change. His sotalol as for his ventricular tachycardia and has been long-standing. His heart implicated in his fatigue. His updated medication list for this problem includes:    Ramipril 5 Mg Caps (Ramipril) ..... One by mouth daily    Furosemide 40 Mg Tabs (Furosemide) .Marland Kitchen... 1 and one half tabs every day    Aspirin 325 Mg Tabs (Aspirin) .Marland Kitchen... 1 by mouth daily  Problem # 3:  FATIGUE (ICD-780.79)  Not sure whether this represents sleep apnea; however, in the event that it persisted following trying to improve his sleep hygiene we'll undertake these sleep study. We'll plan also check a TSH today at the time of his laboratory draw  Orders: TLB-TSH (Thyroid Stimulating Hormone) (84443-TSH)  Problem # 4:  IMPLANTATION OF DEFIBRILLATOR,MEDTRONIC CONCERTO C 154 DWR (ICD-V45.02) Device parameters and data were reviewed and no changes were made  Problem # 5:  CORONARY ATHEROSCLEROSIS NATIVE CORONARY ARTERY (ICD-414.01) stable without symptoms with the rrecent negative Myoview His updated medication list for this problem includes:    Ramipril 5 Mg Caps (Ramipril) ..... One by mouth daily    Aspirin 325 Mg Tabs (Aspirin) .Marland Kitchen... 1 by mouth daily  Other Orders: TLB-BMP (Basic Metabolic Panel-BMET) (80048-METABOL) TLB-Magnesium (Mg) (83735-MG)   Patient Instructions: 1)  Your physician recommends that you schedule a follow-up appointment in: 3 MONTHS  WITH KRISTIN AND PAULA 2)  Your physician recommends that you continue on your current medications as directed. Please refer to the Current Medication list given to you today. 3)  Your physician recommends that you return for lab work ZO:[id number] BMET MAG

## 2010-02-22 ENCOUNTER — Encounter: Payer: Self-pay | Admitting: Internal Medicine

## 2010-02-22 ENCOUNTER — Encounter (INDEPENDENT_AMBULATORY_CARE_PROVIDER_SITE_OTHER): Payer: Medicare Other

## 2010-02-22 DIAGNOSIS — I428 Other cardiomyopathies: Secondary | ICD-10-CM

## 2010-03-02 NOTE — Procedures (Signed)
Summary: device/SAF/Pt. rescheduled from blocked appt/MT/pmo  Medications Added IMURAN 50 MG TABS (AZATHIOPRINE) take2 1/2 tablet daily      Allergies Added: NKDA  Current Medications (verified): 1)  Grapefruit Seed 60 % Extr (Grapefruit Seed) .... Tid 2)  Imuran 50 Mg Tabs (Azathioprine) .... Take2 1/2 Tablet Daily 3)  Sotalol Hcl 160 Mg Tabs (Sotalol Hcl) .... One By Mouth Every 12 Hours 4)  Nexium 40 Mg Cpdr (Esomeprazole Magnesium) .... By Mouth Once Daily 5)  Simvastatin 20 Mg Tabs (Simvastatin) .... One By Mouth Daily 6)  Ramipril 5 Mg Caps (Ramipril) .... One By Mouth Daily 7)  K-Lor 20 Meq Pack (Potassium Chloride) .... One By Mouth Daily 8)  Fish Oil .Marland Kitchen.. 1 Tab Once Daily 9)  Furosemide 40 Mg Tabs (Furosemide) .Marland Kitchen.. 1 and One Half Tabs Every Day 10)  Aspirin 325 Mg  Tabs (Aspirin) .Marland Kitchen.. 1 By Mouth Daily  Allergies (verified): No Known Drug Allergies   ICD Specifications Following MD:  Sherryl Manges, MD     ICD Vendor:  Medtronic     ICD Model Number:  [phone]     ICD Serial Number:  [id number] ICD DOI:  06/22/2007     ICD Implanting MD:  Sherryl Manges, MD  Lead 1:    Location: RA     DOI: 06/22/2007     Model #: 1308     Serial #: [id number]     Status: active Lead 2:    Location: RV     DOI: 06/22/2007     Model #: 7120     Serial #: [id number]     Status: active Lead 3:    Location: RV     DOI: 11/02/1995     Model #: 4010     Serial #: [id number]     Status: capped  Indications::  CHB,ICM GUIDANT PRIZM ICD ON LEFT PROGRAMMED ODO   ICD Follow Up ICD Dependent:  Yes      Episodes Coumadin:  No  Brady Parameters Mode DDD     Lower Rate Limit:  70     Upper Rate Limit 130 PAV 130     Sensed AV Delay:  100  Tachy Zones VF:  200     VT:  240 FVT VIA VF     VT1:  150     Tech Comments:  See PaceArt

## 2010-03-11 NOTE — Cardiovascular Report (Signed)
Summary: Office Visit   Office Visit   Imported By: Roderic Ovens 03/04/2010 16:20:20  _____________________________________________________________________  External Attachment:    Type:   Image     Comment:   External Document

## 2010-04-09 LAB — POCT I-STAT, CHEM 8
BUN: 15 mg/dL (ref 6–23)
Creatinine, Ser: 0.9 mg/dL (ref 0.4–1.5)
Glucose, Bld: 144 mg/dL — ABNORMAL HIGH (ref 70–99)
Sodium: 134 mEq/L — ABNORMAL LOW (ref 135–145)
TCO2: 26 mmol/L (ref 0–100)

## 2010-04-09 LAB — POCT CARDIAC MARKERS: Myoglobin, poc: 158 ng/mL (ref 12–200)

## 2010-04-11 LAB — CBC
Platelets: 246 10*3/uL (ref 150–400)
RBC: 3.6 MIL/uL — ABNORMAL LOW (ref 4.22–5.81)
WBC: 6.3 10*3/uL (ref 4.0–10.5)

## 2010-04-11 LAB — CARDIAC PANEL(CRET KIN+CKTOT+MB+TROPI)
CK, MB: 2.4 ng/mL (ref 0.3–4.0)
Relative Index: 1 (ref 0.0–2.5)
Total CK: 238 U/L — ABNORMAL HIGH (ref 7–232)
Troponin I: 0.01 ng/mL (ref 0.00–0.06)

## 2010-04-11 LAB — BASIC METABOLIC PANEL
CO2: 28 mEq/L (ref 19–32)
Calcium: 8.6 mg/dL (ref 8.4–10.5)
GFR calc Af Amer: >60 mL/min (ref >60)
GFR calc non Af Amer: >60 mL/min (ref >60)
Sodium: 139 mEq/L (ref 135–145)

## 2010-04-11 LAB — MAGNESIUM: Magnesium: 2.2 mg/dL (ref 1.5–2.5)

## 2010-04-12 LAB — CBC
Hemoglobin: 12 g/dL — ABNORMAL LOW (ref 13.0–17.0)
MCHC: 34.3 g/dL (ref 30.0–36.0)
RBC: 3.78 MIL/uL — ABNORMAL LOW (ref 4.22–5.81)
WBC: 6.9 10*3/uL (ref 4.0–10.5)

## 2010-04-12 LAB — URINALYSIS, ROUTINE W REFLEX MICROSCOPIC
Glucose, UA: NEGATIVE mg/dL
Hgb urine dipstick: NEGATIVE
Leukocytes, UA: NEGATIVE
pH: 7.5 (ref 5.0–8.0)

## 2010-04-12 LAB — CK TOTAL AND CKMB (NOT AT ARMC)
CK, MB: 2.9 ng/mL (ref 0.3–4.0)
Relative Index: 1 (ref 0.0–2.5)
Total CK: 295 U/L — ABNORMAL HIGH (ref 7–232)

## 2010-04-12 LAB — COMPREHENSIVE METABOLIC PANEL
ALT: 14 U/L (ref 0–53)
AST: 22 U/L (ref 0–37)
Alkaline Phosphatase: 79 U/L (ref 39–117)
CO2: 30 mEq/L (ref 19–32)
Calcium: 8.8 mg/dL (ref 8.4–10.5)
GFR calc Af Amer: >60 mL/min (ref >60)
GFR calc non Af Amer: >60 mL/min (ref >60)
Glucose, Bld: 106 mg/dL — ABNORMAL HIGH (ref 70–99)
Potassium: 3.9 mEq/L (ref 3.5–5.1)
Sodium: 138 mEq/L (ref 135–145)

## 2010-04-12 LAB — POCT CARDIAC MARKERS

## 2010-04-12 LAB — URINE MICROSCOPIC-ADD ON

## 2010-04-12 LAB — DIFFERENTIAL
Basophils Relative: 0 % (ref 0–1)
Eosinophils Absolute: 0.2 10*3/uL (ref 0.0–0.7)
Eosinophils Relative: 2 % (ref 0–5)
Lymphs Abs: 1.1 10*3/uL (ref 0.7–4.0)
Monocytes Relative: 14 % — ABNORMAL HIGH (ref 3–12)

## 2010-04-12 LAB — CARDIAC PANEL(CRET KIN+CKTOT+MB+TROPI): Troponin I: <0.01 ng/mL (ref 0.00–0.06)

## 2010-04-12 LAB — TROPONIN I: Troponin I: 0.01 ng/mL (ref 0.00–0.06)

## 2010-04-12 LAB — BRAIN NATRIURETIC PEPTIDE: Pro B Natriuretic peptide (BNP): 104 pg/mL — ABNORMAL HIGH (ref 0.0–100.0)

## 2010-04-14 LAB — POCT I-STAT 3, ART BLOOD GAS (G3+): Bicarbonate: 24.8 mEq/L — ABNORMAL HIGH (ref 20.0–24.0)

## 2010-04-14 LAB — POCT I-STAT 3, VENOUS BLOOD GAS (G3P V)
Acid-base deficit: 3 mmol/L — ABNORMAL HIGH (ref 0.0–2.0)
Bicarbonate: 23.4 mEq/L (ref 20.0–24.0)
O2 Saturation: 63 %
TCO2: 25 mmol/L (ref 0–100)

## 2010-04-15 LAB — CBC
HCT: 40.7 % (ref 39.0–52.0)
Platelets: 665 10*3/uL — ABNORMAL HIGH (ref 150–400)
RBC: 4.56 MIL/uL (ref 4.22–5.81)
WBC: 11.5 10*3/uL — ABNORMAL HIGH (ref 4.0–10.5)

## 2010-04-15 LAB — DIFFERENTIAL
Eosinophils Relative: 0 % (ref 0–5)
Lymphocytes Relative: 9 % — ABNORMAL LOW (ref 12–46)
Lymphs Abs: 1 10*3/uL (ref 0.7–4.0)

## 2010-04-15 LAB — BODY FLUID CULTURE: Culture: NO GROWTH

## 2010-04-15 LAB — SYNOVIAL CELL COUNT + DIFF, W/ CRYSTALS
Eosinophils-Synovial: 2 % — ABNORMAL HIGH (ref 0–1)
Neutrophil, Synovial: 21 % (ref 0–25)
WBC, Synovial: 1255 /mm3 — ABNORMAL HIGH (ref 0–200)

## 2010-05-18 NOTE — Cardiovascular Report (Signed)
NAME:  [name], [name]             ACCOUNT NO.:  [id number]   MEDICAL RECORD NO.:  [id number]          PATIENT TYPE:  OIB   LOCATION:  1962                         FACILITY:  [facility]   PHYSICIAN:  Everardo Beals. Juanda Chance, MD, FACCDATE OF BIRTH:  19-Dec-1950   DATE OF PROCEDURE:  11/23/2006  DATE OF DISCHARGE:                            CARDIAC CATHETERIZATION   CLINICAL HISTORY:  [name] is 60 years old and has had remote  stenting of the LAD by Dr. Amil Amen.  He has had an ICD placed for  ventricular tachycardia.  According to his account, he had to go  emergently to [facility] for a revision of his defibrillator which required a  sternotomy.  He has done well since that time.  Dr. Graciela Husbands recently did a  Myoview scan on him which suggested inferior ischemia and arranged for  him to come to the lab for evaluation with angiography.  He has had no  chest pain.   PROCEDURE:  The procedure was performed via the right femoral arteries  and arterial sheath with 4-French, preformed coronary catheters.  A  front wall arterial puncture was performed and Omnipaque contrast was  used.  The patient tolerated the procedure well and left the laboratory  in satisfactory condition.   RESULTS:  Left main coronary artery was free of significant disease.   Left anterior descending artery gave rise to two diagonal branches and  septal perforator.  There was 0% stenosis at the stent site between the  two diagonal branches.  There is no other significant disease.   The circumflex artery gave rise to a marginal branch and a  posterolateral branch.  These vessels were free of significant disease.   The coronary artery was a moderate-sized vessel that gave rise to a  conus branch, right ventricular branch, a posterior descending branch  and small posterolateral branch.  Right coronary was irregular and there  was 40% narrowing in the proximal portion.   The left ventriculogram from the RAO projection showed good wall  motion  with no areas of hypokinesis.  The estimated ejection fraction was 60%.   CONCLUSION:  Nonobstructive coronary artery disease with 0% stenosis at  the stent site in the proximal LAD.  No significant obstruction of  circumflex artery, 40 percent narrowing in the proximal right coronary  and normal LV function.   RECOMMENDATIONS:  The patient has nonobstructive coronary disease.  In  view of these findings, I think the Myoview scan represents a false  positive.  Will plan continued secondary risk factor modification.   HEMODYNAMIC DATA:  Aortic pressure was 114/73 with a mean of 92.  Left  ventricular pressure was 114/17.      Bruce Elvera Lennox Juanda Chance, MD, [facility]  Electronically Signed     BRB/MEDQ  D:  11/23/2006  T:  11/24/2006  Job:  [id number]   cc:   Duke Salvia, MD, [facility]  Osvaldo Shipper. Spruill, M.D.  Cardiopulmonary Lab

## 2010-05-18 NOTE — Discharge Summary (Signed)
[name], [name]             ACCOUNT NO.:  [id number]   MEDICAL RECORD NO.:  [id number]          PATIENT TYPE:  INP   LOCATION:  2856                         FACILITY:  [facility]   PHYSICIAN:  Duke Salvia, MD, FACCDATE OF BIRTH:  July 21, 1950   DATE OF ADMISSION:  12/13/2006  DATE OF DISCHARGE:  12/13/2006                               DISCHARGE SUMMARY   ALLERGIES:  NO KNOWN DRUG ALLERGIES.   FINAL DIAGNOSES:  Discharging December 13, 2006 after ICD generator  change, which is at elective replacement indicator.  With implant of a  Guidant Vitality II ICD by Dr. Sherryl Manges   SECONDARY DIAGNOSES:  1. History of polymorphic ventricular tachycardia.      a.     Status post ICD.      b.     Sotalol, high dose for ventricular tachycardic storm.  2. Abnormal Myoview study, with follow-up catheterization.      Catheterization November 23, 2006; stent in the left anterior      descending artery is patent.  The left circumflex is free of      significant disease, as is the left main.  The right coronary      artery had a 40% proximal stenosis.  The stent in the left anterior      descending artery lies between 2 diagonals.  Ejection fraction 60%.  3. Myasthenia gravis.  4. Recurrent sciatica with prior back surgery, and currently      significant marked back pain.   PROCEDURE:  December 13, 2006 -- Explantation of existing cardioverter-  defibrillator which is at end-of-life.  Also implantation of a Guidant  Vitality II ICD by Dr. Sherryl Manges.  The patient tolerated the  procedure well, and he did have back pain prior and post procedure;  treated with Vicodin.  Discharging December 14, 2006.   MEDICATIONS AT DISCHARGE:  1. Prednisone 5 mg every other day.  2. Sotalol 240 mg twice daily.  3. Enteric-coated aspirin 325 mg daily.  4. Nexium 40 mg daily.  5. Zocor 10 mg daily at bedtime.  6. Lasix 10 mg daily.  7. Naprosyn 5 mg twice daily.  8. Cardizem 120 mg daily.  9.  Vicodin 5/500 as needed for pain.   He is asked to take his bandage off the morning of Thursday, December 14, 2006; and to leave the incision open to the air.  He is to keep his  incision dry for the next 7 days, and to sponge bathe until Wednesday,  December 20, 2006.   FOLLOW-UP:  At [facility], [address] [facility]  on Wednesday, December 27, 2006 at 9:20 a.m.   LAB STUDIES THIS ADMISSION:  Complete blood count drawn December 3:  6.9.  White cells, hemoglobin 14, hematocrit 40.7, platelets 224,  Prothrombin time 12.1, INR 1.0.  Serum electrolytes:  Sodium 136,  potassium 4.5, chloride 98, bicarbonate 30, glucose 103, BUN 10,  creatinine 0.9.      [name], [place]      Duke Salvia, MD, [facility]  Electronically Signed    GM/MEDQ  D:  12/13/2006  T:  12/14/2006  Job:  [id number]   cc:   [name]. [name], M.D.

## 2010-05-18 NOTE — Consult Note (Signed)
[name], [name]             ACCOUNT NO.:  [id number]   MEDICAL RECORD NO.:  [id number]          PATIENT TYPE:  INP   LOCATION:  2032                         FACILITY:  [facility]   PHYSICIAN:  Doylene Canning. Ladona Ridgel, MD    DATE OF BIRTH:  October 21, 1950   DATE OF CONSULTATION:  07/21/2008  DATE OF DISCHARGE:  07/21/2008                                 CONSULTATION   REFERRING PHYSICIAN:  Eduardo Osier. Harwani, MD   INDICATIONS FOR CONSULTATION:  Evaluation and recommendation on ICD  malfunction in the setting of worsening heart failure.   HISTORY OF PRESENT ILLNESS:  The patient is a 60 year old man with  longstanding heart disease.  He has a history of myocardial infarction  with stenting of the LAD in 1997.  He has had a history of ventricular  tachycardia.  The patient has developed complete heart block.  He also  has a history of myasthenia gravis and status post thymectomy.  He has  been recently on steroid therapy for exacerbation.  The patient has a  history of VT and underwent initial ICD implantation back in the 1990s.  Since then he has had multiple generator change out.  Because of the  development of complete heart block and worsening heart failure  symptoms, he underwent initial attempts at upgrade of his ICD.  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  He was noted that time of implant that his anatomy was quite  tortuous and there was actually difficulty in placing the right  ventricular lead.  Attempts to place his left ventricular lead was  unsuccessful as the coronary sinus could not be cannulated.  The patient  however, remained fairly stable despite this until several weeks ago  when he noted that his heart failure symptoms had worsened.  He was seen  by Dr. Graciela Husbands in the office and was found to  have some atrial  undersensing.  An initial reprogramming was carried out.  He was brought  back into the hospital with worsening heart failure today.  It should be  noted that on observation, the patient was clearly undersensing his  atria and this resulted in this synchronous ventricular pacing, which he  very clearly felt and for which she was very clearly symptomatic.  Since  he has been in the hospital, his atrial sensitivity has been again  reprogrammed such that he is now setup in the most sensitive atrial mode  at point with atrial sensed 0.15 W.  And this configuration however he  is appropriately atrial sensing and ventricular pacing.  With this  adjustment his symptoms of heart failure improved fairly markedly.  The  patient has had no recurrent atrial has had no intercurrent ICD  therapies.  Additional past medical and surgical history is notable for  cholecystectomy.  He has a history of back surge   He has history of thymectomy  for thymus cancer.   SOCIAL HISTORY:  The patient is divorced and has 2 children.  He has a  history of tobacco use, but quit smoking in 1980s.  He drinks alcohol  socially, though not in excess.  He is retired, disabled Journalist, newspaper.   FAMILY HISTORY:  Notable for mother died in her 47s of cancer but also  have diabetes and heart problems.  Father died at age 57 of an MI.  His  current medications include sotalol 160 mg twice daily.   MEDICATIONS:  He is also on:  1. Potassium supplements.  2. Imuran 150 mg daily.  3. Aspirin 325 a day.  4. Altace 2.5 twice a day.  5. Protonix 40 a day.  6. Furosemide 40 a day.  7. Prednisone 40 a day.   REVIEW OF SYSTEMS:  Really those of low output heart failure symptoms as  previously described.  He has weakness, fatigue and malaise, this has  all improved since reprogramming of his defibrillator.   PHYSICAL EXAMINATION:  GENERAL:  He is a pleasant middle-aged man, in no  acute distress.  VITAL SIGNS:   Blood pressure was 107/64 the pulse was 72 and regular,  respirations were 18, temperature is 98.  HEENT:  Normocephalic and atraumatic.  Pupils equal and round.  Oropharynx moist.  Sclerae anicteric.  NECK:  Revealed no jugular distention.  No thyromegaly.  Trachea is  midline.  Carotid 2+ and symmetric.  LUNGS:  Clear bilaterally auscultation and no wheezes, rales or rhonchi  present.  There is no increased work of breathing.  CARDIOVASCULAR:  Regular rate and rhythm.  Normal S1 and S2.  There is a  split S2.  The PMI was enlarged and laterally displaced.  ABDOMINAL:  Soft, nontender.  Nondistended.  No organomegaly.  EXTREMITIES:  Demonstrated no cyanosis, clubbing or edema.  The pulses  were 2+ and symmetric.  NEUROLOGIC:  Alert and oriented x3 with cranial nerves intact.  Strength  was 5/5 and symmetric.  His EKG demonstrates sinus rhythm with T  synchronous ventricular pacing.  Interrogation of his defibrillator as  previously noted demonstrates P-waves that are between 0.15 and 0.3.  There is no intercurrent ICD therapies.  His RV pacing functions are  satisfactory.  Chest x-ray demonstrates an abandon left subclavian lead  and ICD lead and implanted right atrial and right ventricular leads.   IMPRESSION:  1. Implantable cardioverter-defibrillator malfunction with atrial      undersensing, now reprogrammed hopefully with improvement.  2. Acute-on-chronic systolic and diastolic heart failure exacerbated      by #1 and also now markedly improved.  3. Ventricular tachycardia, which has been well controlled on sotalol.  4. Complete heart block which is exacerbated by implantable      cardioverter-defibrillator malfunction.   DISCUSSION:  With the patient's atrial undersensing, his heart failure  has worsened.  His device has been reprogrammed.  Review of the chest x-  ray demonstrates no evidence of lead dislodgement.  The patient's other  comorbid problem at the present time is  that of possible mild carditis  slash pericarditis as he does have some pericardial thickening by chest  x-ray and CT scanning.  He has been placed on prednisone in conjunction  with Imuran.  At this point because we have been able to reprogram  around his atrial undersensing it would be my impression that with his  improved symptoms that he no additional procedures be performed on his  device at  the present time.  An option should come to past down the road  would be to place a new atrial lead by way of the right jugular vein  and/or a left ventricular lead at the same time.  If his atrial sensing  worsens further, then this would be required though at the present time  with the program changes made his device is working satisfactorily in  the DDD mode with appropriate atrial sensing.  At this point, I will  have him follow back up back with Dr. Graciela Husbands in our office as an  outpatient in the next several weeks.      Doylene Canning. Ladona Ridgel, MD  Electronically Signed     GWT/MEDQ  D:  07/21/2008  T:  07/21/2008  Job:  [id number]

## 2010-05-18 NOTE — Op Note (Signed)
[name], [name] NO.:  [id number]   MEDICAL RECORD NO.:  [id number]           PATIENT TYPE:   LOCATION:                                 FACILITY:   PHYSICIAN:  Duke Salvia, MD, FACCDATE OF BIRTH:  [birth year]   DATE OF PROCEDURE:  05/20/2008  DATE OF DISCHARGE:                               OPERATIVE REPORT   PREOPERATIVE DIAGNOSIS:  Retained defibrillator.   POSTOPERATIVE DIAGNOSIS:  Retained defibrillator.   PROCEDURE:  Explantation of previously implanted retained defibrillator.   The patient had previously undergone ICD revision with placement of the  contralateral device.  At this juncture after routine prep and drape of  the left upper chest, lidocaine was infiltrated along the line of  previous incision and the incision was made and carried down to the  layer of device pocket using sharp dissection and electrocautery.  The  pocket was opened.  Device was freed up and subsequently explanted.  The  anterior and posterior aspects of the pocket were opposed.  The pocket  was copiously irrigated with antibiotic-containing saline solution.  Wound was then closed in 3 layers in a normal fashion.  The wound was  washed and dried, and a benzoin and Steri-Strip dressing was applied.   Needle, sponge, and instrument counts were correct at the end of the  procedure according to the staff.  The patient tolerated the procedure  without apparent complications.      Duke Salvia, MD, [facility]  Electronically Signed     Duke Salvia, MD, [facility]  Electronically Signed    SCK/MEDQ  D:  07/10/2008  T:  07/10/2008  Job:  [id number]

## 2010-05-18 NOTE — Assessment & Plan Note (Signed)
[facility]                         ELECTROPHYSIOLOGY OFFICE NOTE   NAME:JOHNSONZedekiah, Hinderman                    MRN:          [id number]  DATE:09/19/2007                            DOB:          [birth year]    Mr. Cates was seen following a recent visit because he was feeling  really crummy.  Turned out that he had lost atrial sensing and was then  ventricularly pacing.  Reprogramming of his device was accomplished, and  he feels really quite good at this point.  The discussion had been as to  whether to proceed with CRT upgrade via thoracotomy.  At this point, he  can climb stairs, he can do his jobs or walking around.   We also discussed the residual left-sided pulse generator.  We have not  yet decided when to take that out.   His medications are unchanged,  1. Imuran 150.  2. Sotalol 240 b.i.d.  3. Aspirin.  4. Nexium.  5. Zocor.  6. Lasix.   On examination, his blood pressure is 100/70, his pulse is 76.  His  lungs were clear.  Heart sounds were regular.  The extremities were  without peripheral edema.   Interrogation of his Medtronic Concerto device demonstrates a P-wave of  1 with impedance of 560, a threshold of 0.5 at 0.4.  There was no  intrinsic ventricular rhythm, it was paced at 30, impedance was 364,  threshold of 1 V at 0.4.   The RV outputs were decreased.  The atrial settings were left alone.   IMPRESSION:  1. Complete heart block.  2. Poor atrial sensing with subsequent development of ventricular      pacing and pacemaker syndrome.  3. Ventricular tachycardia on sotalol.  4. Coronary artery disease with prior left anterior descending      stenting and intercurrent deterioration of left ventricular      systolic function most recently about 35-40%.  5. Residual left-sided pulse generator.   Mr. Rawl is doing quite well.  At this point, we will leave him on  his current medications.  We will plan to see him again 3  months' time.  I think at that point, we need to make a decision  about removing his residual device.  I do not know that it offers [place]  anything except potential problems there, although there was an issue  obviously of infection by taking it out.     Duke Salvia, MD, [facility]  Electronically Signed    SCK/MedQ  DD: 09/19/2007  DT: 09/19/2007  Job #: [id number]

## 2010-05-18 NOTE — Discharge Summary (Signed)
[name], [name]             ACCOUNT NO.:  [id number]   MEDICAL RECORD NO.:  [id number]          PATIENT TYPE:  INP   LOCATION:  2032                         FACILITY:  [facility]   PHYSICIAN:  Duke Salvia, MD, FACCDATE OF BIRTH:  06-13-1950   DATE OF ADMISSION:  07/17/2008  DATE OF DISCHARGE:  07/21/2008                               DISCHARGE SUMMARY   This patient has no known drug allergies.   PRIMARY CARE GIVER AND CARDIOLOGIST:  Osvaldo Shipper. Spruill, MD   FINAL DIAGNOSES:  1. Admitted with progressive dyspnea/fatigue/weakness and chest      tightness with exertion.  2. The patient has complete heart block.      a.     The patient has indwelling DDD implantable cardioverter-       defibrillator.      b.     Acceptable A sense and V pacing while at rest, however, with       exertion right atrial lead fails to sense and the patient goes       into essentially a complete heart block with symptoms of July 17, 2008.  3. If left ventricular resynchronization lead is required a right      internal jugular approach would be preferred since a previous      attempt from the right subclavian vein was unable to cannulate the      coronary sinus.   SECONDARY DIAGNOSES:  1. 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  2. Echocardiogram July 18, 2008, ejection fraction 40-45% with grade I      diastolic dysfunction.  3. Left heart catheterization on April 17, 2008, 50-70% proximal right      coronary artery stenosis, ejection fraction 55%, left main, LAD,      and left circumflex had no significant obstruction.  4. Implantable cardioverter-defibrillator implanted in 1997 for      ischemic cardiomyopathy/congestive heart failure/ventricular      tachycardia.      a.     Ventricular tachycardia suppressed x10 years on sotalol (240       mg b.i.d.).      b.     Generator changed in July 2001.      c.      Generator changed again in December 2008, these were both on       the left side.      d.     Medtronic CONCERTO implanted on June 22, 2007 on the right       side (unable to cannulate the coronary sinus with the cardiac       resynchronization therapy).      e.     Complete heart block.  The patient has pacing mediated left       bundle-branch block with a QRS of 160.  5. Gastroesophageal reflux disease.  6. Chronic back pains.  7. Dyslipidemia.  8. Myasthenia gravis status post thymectomy.   BRIEF OUTLINE OF ADMISSION:  The patient admitted on July 17, 2008, with  chest tightness and shortness of breath after about 20 feet of walking  this was duplicated here on telemetry.  The device actually was pegged  at 50 beats per minute.  This is the ventricular back up rate with  exertion, the patient was noted to have incomplete atrial sensing with  inability to transmit atrial rhythms to the ventricle.  Device  interrogation and reprogramming with  decreased atrial sensing from 0.30-0.15 millivolts and also increasing  the low rate back up from 50-70 beats per minute.  The patient  immediately has resolution of his admitting symptoms.  The patient  probably discharged on July 21, 2008, he has to follow up with Dr.  Graciela Husbands, Tuesday August 12, 2008, at 4:15 p.m.      Maple Mirza, [place]      Duke Salvia, MD, [facility]  Electronically Signed    GM/MEDQ  D:  07/21/2008  T:  07/22/2008  Job:  [id number]

## 2010-05-18 NOTE — Assessment & Plan Note (Signed)
[facility]                         ELECTROPHYSIOLOGY OFFICE NOTE   NAME:[name], [name]                    MRN:          [id number]  DATE:03/13/2007                            DOB:          1950-10-26    [name] is seen following ICD generator replacement.  He has a  history of ventricular tachycardia in the setting of ischemic and  nonischemic heart disease.  He has been taking sotalol for a long time  and his VT remains quiescent.  He is having no problems with chest pain  or shortness of breath.  His big problem is his back.   CURRENT MEDICATIONS:  1. Sotalol 240 b.i.d.  2. Lasix 10.  3. Zocor.  4. Aspirin 325.   PHYSICAL EXAMINATION:  VITAL SIGNS:  His blood pressure was 120/77 with  a pulse was 79.  Weight was 190.  LUNGS:  Clear.  HEART:  Heart sounds were regular.  CHEST:  Device pocket was well-healed.  EXTREMITIES:  Without edema.   Interrogation of his Guidant device demonstrates an R-wave of 7.3,  impedance of 340, threshold 2.6 at 0.4, 2.2  at 0.6 and 1.8 at 0.8.  Battery voltage 3.24.   IMPRESSION:  1. Ventricular tachycardia.  2. Sotalol therapy for #1.  3. Nonischemic coronary artery disease with concomitant coronary      disease with prior percutaneous coronary intervention.  4. Myasthenia gravis.   [name] is doing well.  We will see again in three months' time in  the device clinic.     Duke Salvia, MD, [facility]  Electronically Signed    SCK/MedQ  DD: 03/13/2007  DT: 03/14/2007  Job #: [id number]   cc:   Osvaldo Shipper. Spruill, M.D.

## 2010-05-18 NOTE — H&P (Signed)
[name] NO.:  [id number]   MEDICAL RECORD NO.:  [id number]          PATIENT TYPE:  INP   LOCATION:  2923                         FACILITY:  [facility]   PHYSICIAN:  [name], ANP DATE OF BIRTH:  June 03, 1950   DATE OF ADMISSION:  06/20/2007  DATE OF DISCHARGE:                              HISTORY & PHYSICAL   PRIMARY CARDIOLOGIST:  [name] Salvia, MD, [name]   PRIMARY CARE Mat Stuard:  [name], M.D   PATIENT PROFILE:  A 60 year old African American male with history of  CAD and VT status post ICD who presents in complete heart block.   PROBLEMS:  1. Complete heart block with history of transient complete heart block      in April 2009.  2. Coronary artery disease.      a.     Status post bare metal stenting of the LAD in 1997.      b.     Status post cardiac catheterization on November 23, 2006,       showing nonobstructive CAD, EF 60%.  3. Intraventricular tachycardia storm.      a.     Status post ICD in 1997 with generator change in July 2001       and subsequent generator change on December 13, 2006, with       placement of a Guidant Vitality #2 AICD.  4. Myasthenia gravis follow at [name].  5. Remote tobacco abuse, quitting in 1984.  6. Hyperlipidemia.  7. Gastroesophageal reflux disease.  8. Sciatica.  9. History of DJD of L4-L5 with HNP status post anterior lumbar      interbody fusion with decompression and arthrodesis on April 23, 2007, by [name] and [name].   HISTORY OF PRESENT ILLNESS:  A 60 year old African American male with  history of CAD, ventricular tachycardia status post ICD, and myasthenia  gravis.  His myasthenia gravis is managed at [name] and he was taken off  prednisone in April and has subsequently been maintained on [name].  Also in April, he underwent back surgery by Dr. Danielle [name] and  postoperatively was noted to have intermittent heart block with ICD  pacing at 40.  He was evaluated by [name]  and it was not felt that  he required any additional cardiac evaluation.  Sotalol and diltiazem  have been maintained as these have been used for the patient's history  of VT.  Since his back surgery, he has not felt quite right but over the  past 10 days, he has had progressive weakness, fatigue, dyspnea on  exertion, and mid epigastric reproducible discomfort that has been  constant in nature.  He saw [name] about 10 days ago and was told  that his heart rate was low on his ECG and he was advised to follow up  with [name].  He had a scheduled device clinic appointment 1 week ago  and was seen and had his device interrogated.  He was told that device  is working normally but that his heart rate was slow.  I do not  have  records from that visit.  Since then, he has continued to feel  progressively more weak with worsened dyspnea on exertion and continued  epigastric pain.  Also, he had significant nausea this morning.  He is  also noted some bulging of his right jugular vein.  For all these  reasons, he presented to the ED today.  Here he is paced at 40 and  otherwise in complete heart block.   ALLERGIES:  No known drug allergies.   HOME MEDICATIONS:  Vicodin ES 500 mg p.r.n., simvastatin 20 mg daily,  Nexium 40 mg daily, Cardizem CD 120 mg daily, sotalol 240 mg b.i.d., and  [name] 150 mg daily.   FAMILY HISTORY:  Mother possibly died of an MI in her 37s.  Father died  at the age of 74, he is not sure of what.  He has 6 sisters and a  brother.  There is a history of CAD in one of his brothers.   SOCIAL HISTORY:  He lives in [name] by himself.  He has been  disabled secondary to myasthenia gravis since 1984.  He previously  smoked a pack of cigarettes every few days for a total of the less than  10 pack-year history.  He quit in 1984.  Occasionally, he will have an  alcoholic beverage and denies any drug use.  He is not routinely  exercising.   REVIEW OF SYSTEMS:   Positive for epigastric and chest discomfort,  weakness and fatigue and chronic constipation.  Otherwise, all systems  reviewed are negative.   PHYSICAL EXAMINATION:  VITAL SIGNS:  Temperature 96.8, heart rate 40,  respirations 16, blood pressure 159/59, and his pulse ox 98% on room  air.  GENERAL:  This is a pleasant African American male in no acute distress.  Awake, alert, and oriented x3.  HEENT:  Normal.  NEURO:  Nerves grossly intact and nonfocal.  SKIN:  Warm and dry without lesions or masses.  NECK:  With moderately elevated JVP.  No bruits.  LUNGS:  Respirations are unlabored.  CARDIAC:  Regular S1, S2.  Bradycardic.  No S3, S4, and murmurs.  ABDOMEN:  Round, soft, nontender, and nondistended.  Bowel sounds  present x4.  EXTREMITIES:  Warm and dry.  No clubbing, cyanosis, or edema.  Dorsalis  pedis and posterior tibial pulses 2+ equal bilaterally.   His chest x-ray is pending.  EKG shows a paced rhythm at 40 beats per  minute with underlying complete heart block.  Lab work is pending.   ASSESSMENT AND PLAN:  1. Complete heart block.  The patient had transient heart block in      April 2009 and says he has never felt normal since then.  His      symptoms have progressively worsened in the past 10 days.  He is      now paced at 40.  We have discussed this with the Guidant      representative and I have asked they come in to increase his lower      rate to 60 and also interrogate device.  We plan to admit for cycle      cardiac markers.  I will ask [name] to see the patient in the      a.m. for probable upgrade to a DDD device.  For the time being, we      will plan to continue his diltiazem and sotalol as these have kept      his  VT at bay.  If cardiac markers are abnormal, we would plan      doing a catheterization, otherwise, will hold off on cath as the      patient was evaluated in November 2008 and shown to have      nonobstructive disease.  2. Ventricular  tachycardia.  Continue sotalol and diltiazem as above.      EP to see.  3. Coronary artery disease.  Stable.  Add aspirin.  Chest pain is      atypical and epigastric in nature.  Cycle cardiac markers.  4. Gastroesophageal reflux disease.  We will try GI cocktail here in      the ED.  Continue PPI.  Check amylase, lipase, and LFTs.  5. Myasthenia gravis, continue [name].  Outpatient followup at [name].      [name], ANP     CB/MEDQ  D:  06/20/2007  T:  06/21/2007  Job:  [id number]

## 2010-05-18 NOTE — Consult Note (Signed)
NAMEMAYJOR, AGER             ACCOUNT NO.:  [id number]   MEDICAL RECORD NO.:  [id number]          PATIENT TYPE:  INP   LOCATION:  3104                         FACILITY:  [facility]   PHYSICIAN:  Rollene Rotunda, MD, FACCDATE OF BIRTH:  12/22/1950   DATE OF CONSULTATION:  04/23/2007  DATE OF DISCHARGE:                                 CONSULTATION   PRIMARY CARE PHYSICIAN:  Osvaldo Shipper. Spruill, M.D.   CARDIOLOGIST:  Duke Salvia, MD, [facility]   REASON FOR CONSULTATION:  Evaluate patient with transient heart block.   HISTORY OF PRESENT ILLNESS:  The patient is a 60 year old African-  American gentleman with a history of ICD for monomorphic recurrent  ventricular tachycardia.  He had L4-L5 lumbar fusion today.  Prior to  this, the AICD defibrillator mechanism was deactivated.  After surgery,  the patient was transported to the PACU.  He was extubated.  However, he  had increased drowsiness and apnea.  Transient heart block with  ventricular pacing in the 40s.  He was reintubated.  He has had his  device reinterrogated and the ICD function reactivated.   The patient is currently intubated.  However, he is awake.  He has been  in sinus rhythm with occasional transient blocked sinus beats.  Occasionally, we see backup pacing beat if the rate is slow enough.  He  has had no sustained complete heart block since the initial event.   The patient denies by shaking his head any presyncope or syncope  recently.  He has had no tachyarrhythmias.  He has had no device  firings.  He has had no chest discomfort or shortness of breath.   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  Most recent catheterization in 2008 demonstrated widely patent  LAD stent, the right coronary artery had 40% stenosis.), myasthenia  gravis, hypertension, and sciatica.   PAST SURGICAL HISTORY:  Thymectomy and  laparoscopic cholecystectomy.   ALLERGIES:  Morphine.   MEDICATIONS:  1. Sotalol 240 mg b.i.d.  2. Imuran 150 mg daily.  3. Sotalol 240 mg b.i.d.  4. Cardizem CD 120 mg daily.  5. Nexium 40 mg daily.  6. Simvastatin 20 mg daily.  7. Vicodin.  8. Naproxen sodium.   SOCIAL HISTORY:  The patient lives in [place] with girlfriend.  He is  disabled.  He has never smoked cigarettes.  He does not drink alcohol.   FAMILY HISTORY:  Contributory for his father having myocardial  infarctions in his 75s and dying in his 34s.   REVIEW OF SYSTEMS:  Unobtainable from the patient as he is intubated.   PHYSICAL EXAMINATION:  GENERAL:  The patient is in no distress.  VITAL SIGNS:  Blood pressure 119/59, heart rate 66 and regular,  afebrile, respiratory rate 14 by vent, and 98% saturation.  HEENT:  Eyes are unremarkable, pupils equal and reactive to fundi not  visualized, oral mucosa not examined secondary oral intubation.  NECK:  The patient is lying flat, unable to appreciate an jugular venous  distention, but  the waveform is normal, carotid upstroke brisk and  symmetrical, no bruits.  LYMPHATICS:  No adenopathy.  LUNGS:  Clear to auscultation bilaterally.  BACK:  No costovertebral angle tenderness.  CHEST:  Well-healed sternotomy scar.  HEART:  PMI not displaced or sustained, S1 and S2 within normal limits,  no S3 and S4, clicks, rubs, or murmurs.  ABDOMEN:  Flat, positive bowel sounds, normal frequency and pitch.  No  bruits, rebound, or guarding.  No midline pulsatile mass or  organomegaly.  SKIN:  No rashes, no lesions.  EXTREMITIES:  2+ pulses, no edema, cyanosis, or clubbing.  NEURO:  Oriented to person, place, time, cranial nerves II through XII  grossly intact.   With EKG (post procedure) sinus rhythm, rate 70, right bundle-branch  block, left anterior fascicular block.   LABORATORY DATA:  WBC 10.6, hemoglobin 13.1, platelets 231, sodium 135,  potassium 5.0, BUN 14, and  creatinine 0.99.   ASSESSMENT AND PLAN:  1. Complete heart block.  The patient had an episode of transient      complete heart block postoperative.  He had backup pacing.  This      has resolved.  There are occasional blocked a sinus beats.      However, he has had normal pacing function.  No further      cardiovascular testing is suggested or changed.  There is no change      to his therapy suggested.  2. Ventricle tachycardia.  The patient had sustained recurrent      monomorphic ventricular tachycardia.  He is on high doses of      sotalol.  This will be resumed when he is taking p.o.'s.  He      probably only missed 2 doses.  3. Coronary disease.  He is not having any chest pain.  There is no      indication of ischemia.  He will continue with his meds as listed.      We will ask the surgeons if it is alright to restart aspirin      following his surgery.  4. Respiratory failure.  This was probably related to sedation plus or      minus myasthenia.  Neuro was involved.  He will probably get      extubated soon.      Rollene Rotunda, MD, [facility]  Electronically Signed     JH/MEDQ  D:  04/23/2007  T:  04/24/2007  Job:  [id number]

## 2010-05-18 NOTE — Op Note (Signed)
NAME:  WEN, MERCED             ACCOUNT NO.:  [id number]   MEDICAL RECORD NO.:  [id number]           PATIENT TYPE:   LOCATION:                                 FACILITY:   PHYSICIAN:  Stefani Dama, M.D.  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   PROCEDURE:  Anterior lumbar decompression L4-L5, arthrodesis with PEEK  spacer, local autograft and allograft L4-L5.   FIRST ASSISTANT:  Dr. Maeola Harman   Approach is by Dr. Liliane Bade.   INDICATIONS:  [name] is a 60 year old individual who has had  significant problems with spondylosis and lumbar radiculopathy primarily  involving the right lower extremities.  He has had a previous laminotomy  and diskectomy at that level.  He has had continuous unrelenting back  pain and has been advised regarding surgical decompression arthrodesis  via an anterior approach.   PROCEDURE:  The patient was brought to the operating room supine on the  stretcher.  After smooth induction of general endotracheal anesthesia,  he was placed on table in the supine position and the abdomen was  prepped with alcohol, DuraPrep, and draped in a sterile fashion.  L4-L5  was localized on the skin with fluoroscopy and Dr. Madilyn Fireman started the  procedure and exposed the retroperitoneal space through a left  paramedian incision.  Once the space was exposed and the iliac vessels  were dissected free and retracted appropriately, I opened the disk space  with #15 blade and using combination of Kerrison rongeurs removed the  significant quantity of severely degenerated and desiccated disk  material from within the disk space.  This region of posterior  longitudinal  ligament was reached.  There is noted to be thickened  redundant tissue along with osteophytic overgrowth particularly from the  inferior portion of body of L4.  This was removed with high-speed bur  and then a 2, and 4-mm long Kerrison punch.  On the right side, there  was noted to be significant scar tissue with some adhesions to the  common dural tube in the exiting nerve root.  In the lateral recess,  there was a small area of tear in the dura over the exiting nerve root.  This was padded and protected initially with cottonoids and then a small  pledget of DuraGen was placed over the opening and it was secured with  several pieces of Gelfoam.  No spinal fluid leak, continued past this  point.  The dissection was then completed out to the left side where  subligamentous dissection was completed removing the osteophytic  overgrowth and regions of posterior longitudinal ligament that were  attached firmly to the endplate itself.  With the decompression being  completed, a 5-mm barrel bit was used to smooth down the irregular edges  of the vertebral endplates and then 33 mm footplate with 12  mm height  and 6 degrees of lordosis would be appropriate spacer to place in this  area.  The positioning of the spacers and the amount of distraction and  confirmation of the space was checked with fluoroscopy in the AP and  lateral projection and a sample of the patient's bone marrow aspirate  was obtained from L4 vertebrae and this was mixed with some CORTOSS,  which was placed into the interspace and also then placed into the PEEK  spacer.  PEEK spacer was then tamped into position with the AP and  lateral confirmation being checked periodically.  Once the appropriate  placement was obtained and two titanium nails were placed into the  vertebral body, one into L5 and the other one into L4.  Final  radiographs were then obtained.  The remainder of some bone graft was  packed along the lateral  gutters.  Hemostasis in the soft tissues was  then checked.  Retractors were then removed.  The wound was irrigated  copiously with antibiotic irrigating solution.  The soft tissue bleeders  were coagulated with bipolar cautery and then the anterior rectus sheath  was closed with #1 Vicryl in a running fashion, 2-0 Vicryl was used in  the subcutaneous tissues, 3-0 Vicryl subcuticularly with a running  Monocryl on the final subcuticular layer.  The patient tolerated  procedure well.  Blood loss estimated 100 mL.  He was returned to  recovery room in stable condition.      Stefani Dama, M.D.  Electronically Signed     HJE/MEDQ  D:  04/23/2007  T:  04/24/2007  Job:  [id number]

## 2010-05-18 NOTE — Assessment & Plan Note (Signed)
[facility]                         ELECTROPHYSIOLOGY OFFICE NOTE   NAME:JOHNSONMaxie, Slovacek                    MRN:          [id number]  DATE:12/06/2006                            DOB:          22-Nov-1950    HISTORY OF PRESENT ILLNESS:  Mr. Salo is a 60 year old gentleman with  ischemic heart disease that occurred subsequently to the development of  polymorphic ventricular tachycardia for which he received an ICD  implantation for which he has taken Sotalol for VT storm.  His heart  rhythm has been quiescent of late.  Anticipation of device generator  replacement.  He underwent Myoview scanning which was positive and  underwent catheterization demonstrating no obstructive coronary disease.   He has done well.   His device history is notable for a revision of refractured  defibrillator lead that [employer] did while he was still at [employer].   PAST MEDICAL HISTORY:  1. Myasthenia gravis which is currently quiescent.   MEDICATIONS:  1. Prednisone 5 every other day.  2. Imuran 150  3. Nexium.  4. Zocor.  5. Lasix.  6. Sotalol 240 b.i.d.   PHYSICAL EXAMINATION:  VITAL SIGNS:  Blood pressure  126/80, pulse 90.  LUNGS:  Clear.  NECK:  Veins were flat.  HEART:  Sounds were regular with an early systolic murmur.  ABDOMEN:  Soft.  EXTREMITIES:  Without peripheral edema.  SKIN:  Warm and dry.   STUDIES:  Electrocardiogram  demonstrated sinus rhythm at 87 with  intervals of 0.16, 0.18, 0.44.  The axis was leftward at -75 degrees.   IMPRESSION:  1. Nonischemic and ischemic heart disease with prior PCI to his LAD.  2. Ventricular tachycardia with prior storm.  3. Status post ICD for #2.  4. Sotalol for #2.  5. Progressive conduction system disease.  6. Myasthenia gravis on therapy.   Mr. Butner defibrillator has reached ERI.  We will plan to undertake  device generator replacement next week.  I have reviewed with him the  potential risks  including but not limited to fracture, infection and  risk or resuscitation.  He understands these risks and is willing to  proceed.  We will need to keep also an eye on his conduction system  disease; he has currently normal left ventricular function and no  symptoms of heart failure.   Will get laboratories in anticipation of his procedure.     Duke Salvia, MD, [facility]  Electronically Signed    SCK/MedQ  DD: 12/06/2006  DT: 12/06/2006  Job #: [id number]

## 2010-05-18 NOTE — Discharge Summary (Signed)
[name], [name] NO.:  [id number]   MEDICAL RECORD NO.:  [id number]          PATIENT TYPE:  INP   LOCATION:  2923                         FACILITY:  [facility]   PHYSICIAN:  Doylene Canning. Ladona Ridgel, MD    DATE OF BIRTH:  1950-03-31   DATE OF ADMISSION:  06/20/2007  DATE OF DISCHARGE:                               DISCHARGE SUMMARY   ALLERGIES:  This patient has no known drug allergies.   FINAL DIAGNOSES:  1. Status post implant of a Medtronic Concerto dual-chamber      cardioverter defibrillator.      a.     Unable to pass left ventricular lead through the coronary       sinus.      b.     Right atrial lead by way of the right subclavian vein.      c.     Left subclavian vein occluded.      d.     Defibrillator threshold study less than or equal to 20       joules.  2. Admitted with complete heart block with backup pacing at 40 beats      per minute.      a.     Symptoms are weakness, fatigue, short of breath, epigastric       pain and nausea.  3. Echocardiogram June 21, 2007, ejection fraction 35-40%.   SECONDARY DIAGNOSES:  1. Status post implantable cardioverter-defibrillator 1997, for      ventricular tachycardia storm.  2. Myasthenia gravis treated at [facility].  3. Coronary artery disease with bare metal stent to the left anterior      descending 1997.  4. Dyslipidemia.  5. Gastroesophageal reflux disease.  6. Sciatica.  7. Recent lumbar fusion April 23, 2007.   PROCEDURE:  Implant of the Medtronic Concerto cardioverter defibrillator  June 22, 2007, unable to pass a cardiac resynchronization lead.  The  original intent was to upgrade the ICD to a BiV ICD Dr. Lewayne Bunting.  The patient had no postprocedural hematoma.   DISCHARGING DATA:  Chest x-ray has been examined, interrogation of the  device will be done, mobility of the left arm and incision care will be  described.   BRIEF HISTORY FOR THIS GENTLEMAN:  He is a 60 year old male who has a  history of  coronary artery disease.  He has a cardioverter because he  had VT.  He has had recent back surgery.   The patient has sotalol on diltiazem for the history VT.   Since his back surgery, he has not fell right over the past 10 days.  He  has had progressive weakness, fatigue, dyspnea on exertion, mild  epigastric discomfort.  He was seen in the Device Clinic about a week  ago and the device was interrogated.  Device was working well, but his  heart rate was slow.  Since that time for the last 7 days, he has felt  more progressively weak with worsened dyspnea and continued epigastric  pain.  He had significant nausea on the day of presentation June 20, 2007.  On  presentation, he has pacing at 40 through his device and is in  complete heart block.   HOSPITAL COURSE:  The patient admitted with complete heart block,  symptomatic and the patient obviously needs a right atrial lead with his  device.  There was a question of ejection fraction and echocardiogram  was done on June 21, 2007.  Ejection fraction was 35-40%.  Decision was  made to upgrade the device to a BiV ICD.  Procedure was started June 22, 2007.  Unfortunately, the left ventricular lead was unable to pass to  the coronary sinus to place laterally in the left ventricle.  The  patient did get right atrial lead which was placed to the right  subclavian vein.  The left subclavian vein being occluded.  He has a  Librarian, academic cardioverter-defibrillator now with dual-chamber  capabilities discharging June 23, 2007.  He goes home on the following  medications.  With the Keflex 500 mg 1 tablet 1/2 hour before breakfast,  lunch, dinner, and bedtime for the next 5 days since this is a device  change out.   OTHER MEDICATIONS:  Include.  1. Zocor 20 mg daily at bedtime.  2. Nexium 40 mg daily.  3. Diltiazem 120 mg daily.  4. Imuran 150 mg daily.  5. Sotalol 240 mg twice daily.  6. Enteric-coated aspirin 81 mg daily.   Also a  new follow-up at [facility], [address]  ICD  clinic Monday July 09, 2007, at 9:40.  he will see Dr. Graciela Husbands on Tuesday  October 09, 2007, at noon.  Lab studies at this admit hemoglobin was  13.6, hematocrit 40.9, white cells 5.7, platelets of 185.  Serum  electrolytes sodium 140, potassium 4.5, chloride 109, carbonate 25, BUN  is 23, creatinine 0.99, glucose is 99, protime this admission was 16.3,  INR is 1.3, alkaline phosphatase was 74, SGOT 38, SGPT is 21, amylase  94, lipase 21, troponin studies are 0.01, then 0.02, then 0.01, then  0.01.  TSH this admission was 3.063.  BNP this admission 620.      Maple Mirza, PA      Doylene Canning. Ladona Ridgel, MD  Electronically Signed    GM/MEDQ  D:  06/22/2007  T:  06/23/2007  Job:  [id number]   cc:   Osvaldo Shipper. Spruill, M.D.  Duke Salvia, MD, [facility]  Doylene Canning. Ladona Ridgel, MD

## 2010-05-18 NOTE — Op Note (Signed)
NAMEAMAREE, LEEPER NO.:  [id number]   MEDICAL RECORD NO.:  [id number]          PATIENT TYPE:  INP   LOCATION:  2923                         FACILITY:  [facility]   PHYSICIAN:  Doylene Canning. Ladona Ridgel, MD    DATE OF BIRTH:  [birth year]   DATE OF PROCEDURE:  06/22/2007  DATE OF DISCHARGE:                               OPERATIVE REPORT   PROCEDURE PERFORMED:  Left upper extremity contrast venography followed  by implantation of a dual-chamber biventricular ICD by way of the right  subclavian vein.  Of note, the patient's LV lead could not be placed.   INTRODUCTION:  The patient is a 61 year old man with myasthenia gravis  and ventricular tachycardia who is status post ICD insertion many years  ago.  The patient has subsequently developed complete heart block and  was asynchronously RV pacing at a rate of 40 beats per minute on  admission and was found to be in congestive heart failure.  His EF was  found to be 35% by 2-D echo.  He is now referred for BiV-ICD insertion  secondary to all of the above.  Of note, the patient's QRS duration was  190 msec in a pacing-induced left bundle branch block pattern.   PROCEDURE IN DETAIL:  After informed was obtained, the patient was taken  to the diagnostic EP lab in a fasting state.  After usual preparation  and draping, 10 mL of contrast was injected into the left upper  extremity venous system demonstrating a patent left subclavian vein.  At  this point, decision was made to attempt to place the device in the  right side system.  The right subclavian vein was subsequently  cannulated x3 and the [employer] Durata model 7120, 60-cm active-fixation  defibrillation lead with serial number [id number] was advanced into the  right ventricle.  It should be noted that manipulation of the right  ventricular lead was very difficult because of the patient's very  tortuous anatomy.  Ultimately, the lead was placed on the RV septum in  successful  location.  The R-waves there measured 9-10 mV and the pacing  impedance was 566 ohms.  Threshold was 1 volt at 0.5 msec.  10-volt  pacing did not stimulate the diaphragm.  With the ventricular lead in  satisfactory position, attention was then turned to placement of the  atrial lead which was placed in the anterolateral wall of the right  atrium.  There P-waves measured between 1.5 and 2 mV and the patient's  threshold was 1.5 volts at 0.8 msec.  The patient's impedance was 536  ohms.  10-volt pacing did not stimulate the diaphragm both in the right  atrium or the right ventricle.  With atrial and ventricular leads in  satisfactory position, attention was then turned to placement of the LV  lead.  The Medtronic guiding catheter was advanced into the right atrium  and a 6-French hexapolar EP catheter was utilized to attempt to  cannulate the coronary sinus.  The patient's anatomy was quite tortuous  and difficult.  The right atrium was quite large and ultimately after  approximately 1-1/2 hours of trying, the coronary sinus could not in  fact be cannulated.  At this point, the decision was made to abandon  attempts at placement of the left ventricular lead.  The guiding  catheter was removed and the lead was secured to the subpectoralis  fascia with a figure-of-eight silk suture.  The sewing sleeves were also  secured with silk suture.  At this point, a subcutaneous pocket was  fashioned with electrocautery.  Electrocautery was utilized to assure  hemostasis and the pocket was irrigated with kanamycin.  The Medtronic  Concerto biventricular ICD, serial number [id number] was connected to  the right atrial and right ventricular leads and placed back in the  subcutaneous pocket.  Additional kanamycin was utilized to irrigate the  pocket and defibrillation threshold testing carried out.   After the patient was more deeply sedated with Valium and fentanyl, VF  was induced with a T-wave shock  and a 20-joule shock was subsequently  delivered with terminated VF and restored sinus rhythm.  At this point,  no additional defibrillation threshold testing was carried out and the  incision was closed with 2-0 Vicryl followed by layer of 3-0 Vicryl.  Benzoin was painted on the skin, Steri-Strips were applied, and a  pressure dressing was placed, and the patient was returned to his room  in satisfactory condition.   COMPLICATIONS:  There were no immediate procedure complications.   RESULTS:  Demonstrate unsuccessful LV lead placement in a patient with  complete heart block followed by successful implantation of a dual-  chamber ICD in the right subclavian vein.  Of note, the patient's  previously implanted defibrillator was turned off but not removed.  Decision about removing the device later on will be made on an as-needed  basis.      Doylene Canning. Ladona Ridgel, MD  Electronically Signed     GWT/MEDQ  D:  06/22/2007  T:  06/22/2007  Job:  [id number]   cc:   Everardo Beals. Juanda Chance, MD, [facility]

## 2010-05-18 NOTE — Assessment & Plan Note (Signed)
[facility]                         ELECTROPHYSIOLOGY OFFICE NOTE   NAME:JOHNSONAmaree, Leeper                    MRN:          [id number]  DATE:08/07/2006                            DOB:          07-24-1950    [name] was seen today in the clinic on August 07, 2006 for followup  of his Guidant model [id number] Prizm 2. Date of implant was July 09, 1999 for  ventricular tachycardia. On interrogation of his device today, his  battery voltage is 2.56 which is middle of life 2 with about 10%  remaining. Charge time of 15.1 seconds. R waves measured 8.1 millivolt  with a ventricular capture threshold of 2.2 volts at 0.6 msec and a  ventricular lead impedance of 339 ohms. Shock impedance was 40. There  were no episodes since last interrogation. I did increase his right  ventricular output to 3.5 volts at 0.5 msec and demonstrated the ERI  test. He will be seen back again 3 months' time.      Altha Harm, LPN  Electronically Signed      Duke Salvia, MD, [facility]  Electronically Signed   PO/MedQ  DD: 08/07/2006  DT: 08/07/2006  Job #: [id number]

## 2010-05-18 NOTE — Op Note (Signed)
NAMEDOSSIE, SWOR             ACCOUNT NO.:  [id number]   MEDICAL RECORD NO.:  [id number]          PATIENT TYPE:  INP   LOCATION:  2856                         FACILITY:  [facility]   PHYSICIAN:  Duke Salvia, MD, FACCDATE OF BIRTH:  19-May-1950   DATE OF PROCEDURE:  12/13/2006  DATE OF DISCHARGE:  12/13/2006                               OPERATIVE REPORT   PREOPERATIVE DIAGNOSIS:  Ventricular tachycardia previously implanted  implantable cardioverter-defibrillator, now at elective replacement  interval.   POSTOPERATIVE DIAGNOSIS:  Ventricular tachycardia previously implanted  implantable cardioverter-defibrillator, now at elective replacement  interval.   PROCEDURE:  Explantation of the previously implanted device, pocket  revision, implantation of new device with intraoperative defibrillation  threshold testing.   Following obtaining informed consent, the patient was brought to the  electrophysiology laboratory and placed on the fluoroscopic table in  supine position.  Fluoroscopy was undertaken to demonstrate the  orientation of the device, lidocaine was infiltrated along the previous  incision, carried down to layer of device pocket.  Using sharp  dissection electrocautery the pocket was opened.  The device was  explanted.  The lead was inspected appeared to be in good condition.  Interrogation of the previously implanted lead demonstrated a Medtronic  dual coil defibrillator lead the amplitude of which was 10.1 with  impedance of 393, threshold 1.5 volts at 0.5 milliseconds.  High-voltage  impedance was 397 ohms, paced and the distal coil impedance was 413 ohms  paced.   These acceptable parameters recorded, the leads were secured to a  Guidant Vitality 2 ICD serial [id number].  R wave was 8.1 with  impedance of 331 a threshold of 1.8 at 0.5.  High-voltage impedance was  47 ohms.   At this point the device pocket had to be expanded given the disparate  footprint.   Lidocaine was infiltrated on the caudal aspect of the  pocket.  The pocket was extended about 1 cm caudally.  Hemostasis was  obtained.  The pocket was copiously irrigated with antibiotic containing  saline solution and the lead and pulse generator then placed in the  pocket.  Defibrillation threshold testing was undertaken.   Ventricular fibrillation was induced via the T-wave shock.  After a  total duration of 6 seconds a 17 joules shock was delivered through  measured resistance of 37 ohms terminating ventricular fibrillation  restoring sinus rhythm.  The device was implanted.  The pocket secured  to prepectoral fascia.  The wound was then closed in three layers using  Monocryl for layers 1 and 2 and Vicryl for layer 3.  The patient  tolerated procedure without apparent complication.      Duke Salvia, MD, [facility]  Electronically Signed     SCK/MEDQ  D:  12/13/2006  T:  12/14/2006  Job:  [id number]   cc:   Osvaldo Shipper. Spruill, M.D.  Electrophys lab  [employer] pacemaker cl

## 2010-05-18 NOTE — Consult Note (Signed)
[name], [name]             ACCOUNT NO.:  [id number]   MEDICAL RECORD NO.:  [id number]          PATIENT TYPE:  INP   LOCATION:  3104                         FACILITY:  [facility]   PHYSICIAN:  Casimiro Needle L. Reynolds, M.D.DATE OF BIRTH:  02-Sep-1950   DATE OF CONSULTATION:  DATE OF DISCHARGE:                                 CONSULTATION   This evaluation is for weakness following surgery, history of myasthenia  gravis.   HISTORY OF PRESENT ILLNESS:  This is the initial inpatient consultation  evaluation of this 60 year old man with a past medical history, which  includes myasthenia gravis, for which he is followed chronically at  [facility].  He is on Imuran for this, and was recently weaned off prednisone.  He underwent an  L4-5 decompression and fusion with anterior approach  today by Dr. Danielle Dess, which was uneventful.  Following the surgery, the  patient was extubated uneventfully, but then became dyspneic and  diffusely weak with significant retention of CO2.  He was reintubated,  and neurology consultation was requested.  The patient has subsequently  been transferred out of the PACU and into the neuro ICU.  He remains  intubated but is awake.  He complains of pain in his incision site, but  feels that his strength is good at this point.   Past medical history is remarkable for myasthenia gravis as above, has a  long complex cardiac history, with a history of AICD placement and MIs  in the past.   Family/social/review of systems are per admission H&P, Dr. Danielle Dess.   MEDICATIONS:  Per his admission notes.  Of note, he received Zemuron  during the surgery.   PHYSICAL EXAMINATION:  VITAL SIGNS:  Temperature 97, blood pressure  141/74, pulse 78, and respirations 16.  GENERAL:  The patient is a healthy-appearing man, supine, intubated, and  in no obvious distress.  HEAD:  Cranial nerves, normocephalic and atraumatic.  Oropharynx is  intubated.  NECK:  Supple without carotid or  supraclavicular bruit.  HEART:  Regular rate and rhythm without murmur.  NEUROLOGIC:  Mental status:  He is awake and alert.  He is able to  follow 1- to 2-step commands.  He is intubated, and therefore, makes no  effort to speech.  CRANIAL NERVES:  Pupils are equal and reactive.  Extraocular movements  are full without nystagmus.  There is no weakness on cover/uncover  testing of the extraocular muscles.  There is normal strength of eye  closure.  He has a mild facial diplegia, though it is difficult to tell  with the ET tube in place.  MOTOR:  Normal bulk and tone, he has excellent strength in extremity  muscles, little bit of give-way related to his painful abdominal  incision.  SENSORY:  Intact to light touch.  Toes downgoing bilaterally.   IMPRESSION:  1. Muscle weakness due to prolonged effect of a paralytic in the      patient with myasthenia gravis.  He now appears to have very good      strength.  This is not a true flare, but represents a prolonged  reaction to neuromuscular blockade in a patient with neuromuscular      junction disease.  2. Myasthenia gravis, which seems to be quite stable on Imuran.   RECOMMENDATIONS:  He should receive no further paralytics, and other  drugs which can interfere with neuromuscular function such as mycin  antibiotics, should be avoided.  He will continue the Imuran at the  present dose.  I think at this point should be extubated and he should  do quite well.  He will follow up as needed.      Michael L. Thad Ranger, M.D.  Electronically Signed     MLR/MEDQ  D:  04/23/2007  T:  04/24/2007  Job:  [id number]

## 2010-05-18 NOTE — Op Note (Signed)
NAME:  [name], [name]             ACCOUNT NO.:  [id number]   MEDICAL RECORD NO.:  [id number]          PATIENT TYPE:  INP   LOCATION:  3104                         FACILITY:  [facility]   PHYSICIAN:  Balinda Quails, M.D.    DATE OF BIRTH:  [birth year]   DATE OF PROCEDURE:  04/23/2007  DATE OF DISCHARGE:                               OPERATIVE REPORT   SURGEON:  Balinda Quails, MD   CLOSE SURGEON:  Stefani Dama, MD   ANESTHETIC:  General endotracheal.   PREOPERATIVE DIAGNOSIS:  L4-L5 degenerative disk disease.   POSTOPERATIVE DIAGNOSIS:  L4-L5 degenerative disk disease.   PROCEDURE:  L4-L5 anterior lumbar interbody fusion (ALIF).   CLINICAL NOTE:  [name] is a 60 year old gentleman who is  scheduled this time to undergo L4-L5 ALIF by Dr. Danielle Dess.  He was seen  preoperatively in the holding area.  He has intact lower extremity  pulses.  No history of DVT or pulmonary embolus.  No contraindication to  surgery.  Risks and benefits of the operative procedure were explained  to the patient in detail.  Potential risks were explained including  ureter injury, vessel injury, limb ischemia, bleeding, transfusion,  infection, hernia, sexual dysfunction, and other major complication.  The patient consented for surgery.   OPERATIVE PROCEDURE:  The patient was brought to the operating room in  stable and hemodynamic condition.  Placed under general endotracheal  anesthesia in supine position.  Foley catheter inserted.  Pulse oximeter  placed on the left foot.  He remained stable throughout the operative  procedure.   Transverse skin incision made in the left lower quadrant at a pre-marked  site for projection of L4-L5.  Subcutaneous tissue divided with  electrocautery.  Left anterior rectus sheath was divided from midline to  lateral margin.  The left rectus muscle mobilized medially.  The  posterior rectus sheath incised with a 15-blade and the peritoneum  pushed off the posterior  rectus sheath which was incised to the midline.  The retroperitoneal space was then opened and developed bluntly.  The  left psoas muscle and genitofemoral nerve were identified.  The nerve  left on the muscle.  The left common iliac artery and external iliac  artery were then skeletonized bluntly.  The ureter and nerves were  reflected medially with the abdominal contents.  The left common iliac  vein was then freed bluntly and 2 identified iliolumbar veins were  ligated with 2-0 silk and divided.  This allowed full mobilization of  the vein.  The L4-L5 disk was identified.  All 4 segmental vessels were  ligated with clips and divided.  This allowed full mobilization of the  left common iliac artery and vein which were reflected and mobilized to  the right.  The vessel was completely mobilized over to allow complete  exposure of the L4-L5 disk.  Middle sacral vessel was ligated with clips  and divided.   Using a Thompson-Brau retractor system reverse lip blades were placed on  the lateral margins of the bodies of L4-L5.  These were used to expose  the disk completely.  Dr. Danielle Dess then verified position of the L4-L5 disk with fluoroscopy.   Dr. Danielle Dess then completed the ALIF.   Closure was then completed by Dr. Danielle Dess.   There were no apparent complications during operative procedure.  In the  recovery room, the patient was noted have intact pedal pulses  bilaterally.      Balinda Quails, M.D.  Electronically Signed     PGH/MEDQ  D:  04/23/2007  T:  04/24/2007  Job:  [id number]

## 2010-05-18 NOTE — Discharge Summary (Signed)
NAMESABRINA, ARRIAGA             ACCOUNT NO.:  [id number]   MEDICAL RECORD NO.:  [id number]          PATIENT TYPE:  INP   LOCATION:  2032                         FACILITY:  [facility]   PHYSICIAN:  Eduardo Osier. Sharyn Lull, M.D. DATE OF BIRTH:  04-04-1950   DATE OF ADMISSION:  07/17/2008  DATE OF DISCHARGE:  07/21/2008                               DISCHARGE SUMMARY   ADMITTING DIAGNOSES:  1. Dizziness.  2. Weakness.  3. Near syncope.  4. Rule out cardiac arrhythmias.  5. Rule out implantable cardioverter-defibrillator mall function.  6. Hypotension, secondary to medications.  7. Coronary artery disease.  8. History of percutaneous coronary intervention to left anterior      descending in the past.  9. 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  10.History of complete heart block.  11.Hypertension.  12.Myasthenia gravis.  13.History of tobacco abuse.  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  15.History of thymectomy in the past.  16.Hypercholesteremia.   DISCHARGE DIAGNOSES:  1. Status post dual-mode, dual-pacing, dual-sensing implantable      cardioverter-defibrillator.  2. Atrial non-sensing, status post dizziness and weakness, probably      secondary to exacerbation of myasthenia gravis.  3. Status post hypotension, secondary to medications.  4. Mild pericardial effusion, probably secondary to exacerbation of      myasthenia gravis.  5. Coronary artery disease.  6. History of percutaneous coronary intervention to left anterior      descending in the past.  7. History of multiform ventricular tachycardia, status post      implantable cardioverter-defibrillator in the past.  8. History of complete heart block in the past.  9. Hypertension.  10.History of tobacco abuse.  11.History of carcinoma of thymus, status post thymectomy in the past.  12.Hypercholesteremia.   DISCHARGE MEDICATIONS:  1. Enteric-coated  aspirin 325 mg 1 tablet daily.  2. Sotalol 160 mg p.o. q.12 h.  3. Nexium 40 mg p.o. daily.  4. Imuran 150 mg p.o. daily.  5. Prednisone has been restarted 30 mg daily for 3 days, 20 mg daily      for 3 days, and 10 mg p.o. daily.  6. Lasix 80 mg p.o. daily.  7. K-Dur 20 mEq p.o. daily.  8. Ramipril 5 mg p.o. daily.  9. Simvastatin 40 mg p.o. daily.   DIET:  Low-salt, low-cholesterol 1800 calories ADA diet.   ACTIVITY:  As tolerated.   FOLLOWUP:  Follow up with Dr. Graciela Husbands in 2-3 weeks Dr. Shana Chute in 1 week  and follow up at [facility] for his myasthenia gravis in 1-2  weeks.   CONDITION AT DISCHARGE:  Stable.   BRIEF HISTORY AND HOSPITAL COURSE:  Mr. Wilsey is 60 year old black  male with past medical history significant for coronary artery disease;  history of PTCA stenting to LAD in 1997; history of polymorphic VT,  status post ICD x2, status post extraction of the ICD; hypertension;  hypercholesteremia; myasthenia grave, being followed at [facility]; GERD;  degenerative joint disease, status post anterior lumbar interbody  fusion; and history of complete heart block.  He came to the ER  complaining of progressive increasing shortness of breath associated  with feeling weak, tired, no energy and low blood pressure for last 7-8  days.  Denies any chest pain.  Denies nausea, vomiting, diaphoresis,  PND, orthopnea, and leg swelling.  Denies complication, lightheadedness,  or syncope recently.  Denies any recent ICD discharges.   PAST MEDICAL HISTORY:  As above.   PAST SURGICAL HISTORY:  1. He had thymectomy for CA of thymus in the past.  2. Bi-ICD x2.  3. Back surgery as above.  4. Cholecystectomy in the past.   SOCIAL HISTORY:  He is divorced, has 2 children.  Smokes one-pack per  week, quit in 1984.  Drinks wine socially, occasionally.  Retired on  disability.  Worked as Journalist, newspaper in the past.   FAMILY HISTORY:  Father died of cancer.  He was diabetic, also had  heart  problems.  Mother died at age of 66.  She had MI.  One brother had heart  rhythm problems.   ALLERGIES:  No known drug allergies.   MEDICATION:  At home, he was on:  1. Enteric-coated aspirin 325 mg p.o. daily.  2. Avapro 150 mg p.o. daily.  3. Sotalol 240 mg p.o. b.i.d.  4. Nexium 40 mg p.o. daily.  5. Imuran 150 mg p.o. daily.  6. Lasix 80 mg p.o. daily.  7. Vicodin p.r.n.   PHYSICAL EXAMINATION:  GENERAL:  On examination, he was alert, awake,  and oriented x3, in no acute distress.  VITAL SIGNS:  Blood pressure was 109/60, pulse was 73 and afebrile.  HEENT:  Conjunctivae was pink.  NECK:  Supple.  No JVD.  No bruit.  LUNGS:  He has decreased breath sounds at left base, otherwise good air  entry without rales.  CARDIOVASCULAR:  S1 and S2 is normal.  There was soft systolic murmur.  There was no S3 gallop or rub.  ABDOMEN:  Soft.  Bowel sounds are present.  There was mild epigastric  tenderness.  There was no guarding.  EXTREMITIES:  There is no clubbing, cyanosis, or edema.   LABORATORIES:  His D-dimer was 1.32.  BNP was 104.  Two sets of cardiac  enzymes were negative.  Sodium was 138, potassium 3.9, BUN 12,  creatinine 1.03, and glucose was 106.  His magnesium was 2.2.  Hemoglobin was 11.3, hematocrit 33.5, and white count of 6.3.   Spiral CT of the chest showed no evidence of acute pulmonary embolism.  There was high-density thickening of the cardia to 7 mm, this would  rather represent thickened pericardium from the inflammation versus a  small volume of hemopericardium and there was soft tissue density in the  anterior mediastinum, which was difficult to present threshold thymoma,  but could not exclude extension of the hemopericardium, fevers, spasms,  and tissue, which is stable as before.  Chronically elevated left  hemidiaphragm could also be related to the patient's myasthenia gravis.   BRIEF HOSPITAL COURSE:  The patient was admitted to telemetry unit.   EP  consultation was obtained.  The patient had upon interrogation, noted to  have atrial sensing, ICD was reprogrammed.  The patient also had 2-D  echo done, which showed a new small amount of pericardial effusion with  no evidence of tamponade.  The patient and friend stated since his  prednisone has been weaned off at [facility], he has been lately feeling tired  and weak and fatigue with no energy with small amount  of effusion and  generalized weakness and fatigue.  The patient was restarted on  prednisone with marked improvement in his symptoms.  The patient has  been ambulating in hallway without any problems.  The patient's Betapace  dose has been reduced from 240-160 every 12 hours.  The patient remained  in paced rhythm with no evidence of atrial undersensing after  reprogramming the DDD ICD.  The patient will be discharged home on above  medications and will be followed up by Dr. Shana Chute in 1 week Dr. Graciela Husbands  in 2-3 week and at [facility] in 1-2 weeks.  The patient will be scheduled for  2-D echo as outpatient by Dr. Shana Chute for followup of effusion and also  gradual steroid taper.      Eduardo Osier. Sharyn Lull, M.D.  Electronically Signed     MNH/MEDQ  D:  07/21/2008  T:  07/22/2008  Job:  [id number]   cc:   Osvaldo Shipper. Spruill, M.D.

## 2010-05-18 NOTE — Assessment & Plan Note (Signed)
[facility]                         ELECTROPHYSIOLOGY OFFICE NOTE   NAME:JOHNSONFarah, Benish                    MRN:          [id number]  DATE:12/05/2007                            DOB:          May 19, 1950    [name] is seen in followup for ventricular tachycardia in the  setting of ischemic heart disease and complete heart block.  The most  recent issues have been the question of heart failure.  He continues to  do quite well, climbing stairs, working in the garage without too much  difficulty.  The other issue was what to do with his residual right-  sided pacemaker, and it bothers him, essentially not at all.   His other complaint is that he has had intermittent left infrascapular  pain.  This is unrelated to exertion.  It comes on mostly at rest.  It  improves concurrent with pillows and shoulder motions.   CURRENT MEDICATIONS:  1. Imuran 150.  2. Sotalol 240 b.i.d.  3. Aspirin 325.  4. Nexium 40.  5. Lisinopril 5.  6. Lasix 10.   PHYSICAL EXAMINATION:  VITAL SIGNS:  His blood pressure today was 108/60  and his pulse was 78.  NECK:  Veins were flat.  LUNGS:  Clear.  HEART:  Sounds were regular without murmurs or gallops.  ABDOMEN:  Soft.  EXTREMITIES:  No edema.   Interrogation of his Medtronic Concerta device demonstrates a P-wave of  2.3 with impedance of 592 and threshold of 2 volts at 0.4.  There was no  intrinsic ventricular rhythm.  Impedance was 376 and threshold was 1  volt at 0.4.  He was 100% ventricularly paced and 0% atrial paced in the  DDD mode.   IMPRESSION:  1. Ventricular tachycardia - Quiescent, on sotalol.  2. Ischemic and nonischemic heart disease with left anterior      descending stenting and ejection fraction of 35-40% at an echo in      2009.  3. Myasthenia gravis.  4. Atypical, certainly noncardiac chest pain.  5. Congestive heart failure.   [name] is doing well from arrhythmia point of view.  As  noted, I do  not think his chest pain is cardiac.  We will see him again in 6 months'  time.  As noted also above, we have decided to leave his residual right-  sided pacemaker alone and his congestive heart failure at this point  remains class II in the absence of more definitive indications.  We will  not proceed with device upgrade to CRT.   There are recommendations to explant residual pacemakers as end of life  behaviour and response to shock from the implancted ICD can be  unpredictable     Duke Salvia, MD, [facility]  Electronically Signed    SCK/MedQ  DD: 12/05/2007  DT: 12/05/2007  Job #: [id number]   cc:   Duke Salvia, MD, [facility]

## 2010-05-18 NOTE — Assessment & Plan Note (Signed)
[name]                         ELECTROPHYSIOLOGY OFFICE NOTE   NAME:JOHNSONJsiah, [name]                    MRN:          [id number]  DATE:08/30/2007                            DOB:          August 05, 1950    [name] comes in today having had some problems getting through to  our office earlier this week and having had to go see Dr. Shana Chute who  took an electrocardiogram and felt that he has incomplete heart block  with ventricular pacing at 50.  His complaint was over the last couple  of weeks, he has been feeling really really tired, without energy.  I  looked to the electrocardiogram and I anticipated that we would find  atrial lead failure.  Initial interrogation failed to show that;  however, on repeat interrogation, it was clear that he has positional  atrial undersensing resulting in failure to sense the atrium in most  positions except lying down and then it was intermittent.  We  reprogrammed his atrial sensitivity from 0.9-0.3; 0.15.  There was far-  field atrial crosstalk.   I was somewhat surprised to see the left-sided pulse generator still  evident on chest x-ray.  I will need to consider whether that should be  removed.   Dr. Ladona Ridgel had tried to put a third lead in at the time of the device  generator replacement; he had not been successful in that endeavor.   He does have a CRT device implanted, so an LV lead is potentially  recruitable.   At this point what I would like to do is to begin ACE inhibitor therapy  given his LV dysfunction, given a prescription for lisinopril.  I have  reprogrammed his atrial sensitivity as noted.  We will plan to see him  again in 3 weeks' time to assess how it is that he is responding to the  above, consider further titration of his medications including having to  consider what to do about his sotalol for his VT given his LV  dysfunction and heart failure.  We may want to switch to Tikosyn and  at  Coreg.  I would prefer to leave the antiarrhythmic alone as it has been  successful for the last decade, but as his LV function deteriorates, we  may in a pattern to change.  The other question would be is how is he  doing functionally once we get him on ACE inhibitors and appropriate  beta-blocker therapy and to asked the question at that juncture whether  LV lead placement either with a re-attempt endovascularly or a surgical  placement would be appropriate.     Duke Salvia, MD, [facility]  Electronically Signed    SCK/MedQ  DD: 08/30/2007  DT: 08/31/2007  Job #: [id number]   cc:   Osvaldo Shipper. Spruill, M.D.

## 2010-05-18 NOTE — Cardiovascular Report (Signed)
NAMEJAHMIL, MACLEOD             ACCOUNT NO.:  [id number]   MEDICAL RECORD NO.:  [id number]          PATIENT TYPE:  OIB   LOCATION:  1962                         FACILITY:  [facility]   PHYSICIAN:  Everardo Beals. Juanda Chance, MD, FACCDATE OF BIRTH:  [birth year]   DATE OF PROCEDURE:  04/17/2008  DATE OF DISCHARGE:  04/17/2008                            CARDIAC CATHETERIZATION   CLINICAL HISTORY:  Mr. Meloche is 60 years old and has had a pacemaker  and ICD placed.  He has an ICD on the left side which is single chamber  and is turned off and he has a dual-chamber ICD on the right side which  is turned on.  He has had a history of good systolic function.  Recently, he has had symptoms of increased chest pain and was seen by  Dr. Graciela Husbands who has made arrangements for evaluation with angiography.   PROCEDURE:  Right heart catheterization was performed percutaneously via  right femoral vein using a venous sheath and Swan-Ganz thermodilution  catheter.  Left heart catheterization was performed percutaneously via  right femoral artery using arterial sheath and 4-French preformed  coronary catheters.  A front wall arterial puncture was performed and  Omnipaque contrast was used.  The patient tolerated the procedure well  and left laboratory in satisfactory condition.   RESULTS:  Left main coronary artery:  Left main coronary artery was free  of significant disease.   Left anterior descending artery:  The left anterior descending artery  gave rise to 2 diagonal branches and several septal perforators.  The  LAD was irregular and slightly small in caliber, but there was no  significant obstruction.   Circumflex artery:  The circumflex artery was a moderate-sized vessel  that gave rise to a marginal branch, an atrial branch, and a large  posterolateral branch.  These vessels were irregular, but there was no  significant obstruction.   Right coronary artery:  The right coronary artery is a moderate-sized  vessel that gave rise to a conus branch, a right ventricular branch, a  posterior descending branch, and 2 posterolateral branches.  There was  50-70% narrowing in the proximal right coronary artery.  There were  irregularities in the mid right coronary artery.   Left ventriculogram:  The left ventriculogram performed in the RAO  projection showed good wall motion with slight inferior wall  hypokinesis.  The estimated ejection fraction was 55%.   HEMODYNAMIC DATA:  The right atrial pressure was 18 mean.  The pulmonary  artery pressure was 48/24 with a mean of 35.  Pulmonary wedge pressure  was 24.  Left ventricular pressure was 125/31.  The aortic pressure was  125/76 with a mean of 94.  The cardiac output/cardiac index was 4.3/2.2  liters/minute/meter squared by Fick.   CONCLUSION:  1. Nonobstructive coronary artery disease with irregularities in the      LAD and circumflex artery and 50-70% narrowing in the proximal      right coronary artery.  2. Inferior wall hypokinesis but overall preserved left ventricular      systolic function with an estimated fraction of 55%.  3. Status post previous ICD placement on the left and a dual-chamber      ICD placement on the right.  4. Elevated left ventricular filling pressures consistent with      diastolic heart failure.   RECOMMENDATIONS:  I will discuss these findings with Dr. Graciela Husbands and  arrange follow up to decide regarding further management.      Bruce Elvera Lennox Juanda Chance, MD, [facility]  Electronically Signed     BRB/MEDQ  D:  04/17/2008  T:  04/18/2008  Job:  [id number]   cc:   Duke Salvia, MD, [facility]  Cardiopulmonary Lab

## 2010-05-18 NOTE — Assessment & Plan Note (Signed)
[facility]                         ELECTROPHYSIOLOGY OFFICE NOTE   NAME:JOHNSONBrek, Reece                    MRN:          [id number]  DATE:11/16/2006                            DOB:          11-09-1950    Mr. Gautreau is a patient with known coronary artery disease with prior  stenting.  He underwent Myoview scanning in anticipation of  defibrillator generator replacement, which is implanted for ventricular  tachycardia, for which he takes sotalol.   His Myoview scan demonstrates an inferior wall perfusion defect that  redistributes.   I have discussed this with him.  We will plan to schedule him for an  outpatient catheterization.     Duke Salvia, MD, [facility]  Electronically Signed    SCK/MedQ  DD: 11/17/2006  DT: 11/18/2006  Job #: [id number]

## 2010-05-18 NOTE — Assessment & Plan Note (Signed)
[facility]                         ELECTROPHYSIOLOGY OFFICE NOTE   NAME:JOHNSONClearence, Vitug                    MRN:          [id number]  DATE:11/08/2006                            DOB:          [birth year]    Mr. Ledford has ventricular tachycardia that has been recurrent.  He is  status post ICD and he is on high dose Sotalol.  He has also had  problems with ischemic heart disease, with an LAD stent.  He has not had  any complaints of chest pain, shortness of breath or peripheral edema.  His activity right now is limited in a major way however, by recurrent  problems with sciatica; he is status post prior surgery by Dr. Georges Lynch.  Gioffre.  Repeat consultation with Dr. Darrelyn Hillock is pending.   His other medications include:  1. Prednisone 5.  2. Imuran 150.  3. Aspirin 325.  4. Zocor.  5. Lasix.  6. Sotalol 240 b.i.d.   EXAMINATION:  Blood pressure 131/87, pulse 80.  LUNGS:  Clear.  HEART:  Heart sounds were regular.  EXTREMITIES:  Without edema.  SKIN:  Warm and dry.   Interrogation of his Guidant with programmed ICD demonstrates an R-wave  of 7, with impedance of 359.  A threshold of 2.2 volts at 0.6 msec.  High voltage impedance was 42 ohms, with threshold 2.51.  Charge time  was 16 seconds.   IMPRESSION:  1. Ventricular tachycardia.  2. Status post intracoronary device for the above, with appropriate      therapy.  Now approaching ERI.  3. Status post left anterior descending artery stenting without other      evidence of coronary disease, and normal overall left ventricular      function.  4. Status post myasthenia gravis.  5. Recurrent sciatica with prior back surgery.   Mr. Fordham is approaching ERI.  We will continue to follow him closely.  We will plan to obtain an adenosine Myoview in the interim, given his  history of coronary disease and the 5-6 year interval since his last  study.  This will also be important in case further  back surgery were  necessary.   We will see him again in 3 months time, and he is alerted to call us if  there is any beeping reflecting ERI.     Duke Salvia, MD, [facility]  Electronically Signed    SCK/MedQ  DD: 11/08/2006  DT: 11/09/2006  Job #: [id number]   cc:   Osvaldo Shipper. Spruill, M.D.

## 2010-05-18 NOTE — Consult Note (Signed)
NAME:  [name], [name] NO.:  [id number]   MEDICAL RECORD NO.:  [id number]          PATIENT TYPE:  EMS   LOCATION:  MAJO                         FACILITY:  [facility]   PHYSICIAN:  Lowella Bandy, MD      DATE OF BIRTH:  1950/09/22   DATE OF CONSULTATION:  11/24/2006  DATE OF DISCHARGE:  11/24/2006                                 CONSULTATION   PRIMARY CARDIOLOGIST:  Duke Salvia, MD, [facility], [facility].   REFERRING PHYSICIAN:  Emergency department.   REASON FOR CONSULTATION:  Evaluation of ICD beeping.   HISTORY OF PRESENT ILLNESS:  [name] is a very pleasant 60 year old  African American male with a history of coronary artery disease, status  post stenting of his LAD in 1997 as well as a history of monomorphic  ventricular tachycardia, status post placement of Guidant ICD who has  been on high dose sotalol therapy.  He recently underwent a adenosine  nuclear stress test in anticipation of elective defibrillator generator  replacement which was concerning for possible inferior ischemia.  He  underwent cardiac catheterization yesterday, November 23, 2006 which  revealed a widely patent LAD stent and mild nonobstructive disease in  the right coronary artery with approximately a 40% stenosis.  He was  recovering well at home today when he noted that his ICD had a series of  beeps.  He had previously been alerted that this might relate to the  battery life of the defibrillator and he was concerned and presented to  the emergency department for evaluation.  He denies any other acute  symptoms.  No chest pain, no palpitations, or ICD firings.   PAST MEDICAL HISTORY:  1. Coronary artery disease, status post stent to the LAD in 1997.      Cardiac catheterization November 23, 2006 as described in the      history of present illness.  2. History of monomorphic ventricular tachycardia, status post Guidant      ICD placement in 2001.  3. Myasthenia gravis.  4.  Hypertension.  5. Sciatica.  6. Status post thymectomy.  7. Status post laparoscopic cholecystectomy.   MEDICATIONS:  1. Aspirin, azathioprine  2. Azathioprine.  3. Cardizem CD 120 mg once a day.  4. Vicodin p.r.n.  5. Naprosyn 500 mg b.i.d.  6. Nexium 40 mg b.i.d.  7. Prednisone.  8. Zocor 20 mg daily.  9. Sotalol 240 mg b.i.d.   SOCIAL HISTORY:  The patient is a nonsmoker and denies alcohol use.  He  is disabled.   FAMILY HISTORY:  Noncontributory.   REVIEW OF SYSTEMS:  Positive for mild pain at his cardiac  catheterization site in his right groin, but denies significant  swelling, warmth or erythema.  He also has chronic back pain.  Otherwise  10 systems reviewed and otherwise negative except as noted above in the  HPI.   PHYSICAL EXAMINATION:  VITAL SIGNS:  Blood pressure 112/59, pulse 82,  respirations 18, oxygen saturation 98% on room air.  GENERAL:  The patient breathing comfortably in no apparent distress.  HEENT:  Normocephalic, atraumatic.  Extraocular  movements intact.  Sclerae anicteric.  NECK:  Supple.  No masses appreciated.  No JVD.  CARDIOVASCULAR:  Regular rate and rhythm.  No murmurs, rubs or gallops.  CHEST:  Clear to auscultation bilaterally.  ABDOMEN:  Soft, nontender, nondistended.  Normal bowel sounds.  EXTREMITIES:  Warm, no edema.  NEUROLOGIC:  Alert and oriented x3.  Cranial nerves grossly intact.  Moving all extremities well.  SKIN:  No rashes.  MUSCULOSKELETAL:  No joint effusions erythema.   ICD interrogation revealed no episodes of ventricular dysrhythmias.  His  device has now reached ERI, but has significant battery life left before  it reaches EOL.   ASSESSMENT:  A 60 year old male with coronary artery disease and history  of ventricular tachycardia status post ICD placement whose ICD has now  reached elective replacement indication.  However, there is significant  battery life left before the device reaches end of life and this   replacement does not need to be done urgently.   PLAN:  The patient was discharged from the emergency department with  reassurance.  He was instructed to follow up with Dr. Graciela [name] on Monday,  November 27, 2006 for arrangement of elective generator replacement of  his ICD.  Otherwise he will continue his current medications.      Lowella Bandy, MD  Electronically Signed     JJC/MEDQ  D:  11/24/2006  T:  11/25/2006  Job:  [id number]

## 2010-05-18 NOTE — Assessment & Plan Note (Signed)
[facility]                         ELECTROPHYSIOLOGY OFFICE NOTE   NAME:JOHNSONGreogory, Cornette                    MRN:          [id number]  DATE:01/23/2008                            DOB:          [birth year]    Mr. Gullett is seen in followup for congestive heart failure in the  setting of ischemic and nonischemic heart disease and prior thymoma for  myasthenia gravis.  He has developed complete heart block and underwent  generator replacement in June.  The left side being presumably occluded  and Dr. Ladona Ridgel went to the right side.  From the right side, he was  unable to cannulate the coronary sinus and gave him a dual-chamber  device.  This was associated with a significant improvement which over  the last couple of weeks has gotten worse, but he now states he had  missed his Lasix.  He has resumed his Lasix and interestingly optimal  indices demonstrates a significant improvement in transthoracic  impedance.  His symptoms have not yet caught up.   His other complaint is of a dry persistent cough.   MEDICATIONS:  1. Lisinopril 5.  2. Imuran 150.  3. Sotalol 240 b.i.d.  4. Aspirin.  5. Nexium.  6. Zocor 10.  7. Lasix 10 daily.   PHYSICAL EXAMINATION:  VITAL SIGNS:  His blood pressure today was  128/62, his weight was 180 which is stable.  NECK:  Veins were flat.  LUNGS:  Clear.  HEART:  Sounds were regular without murmurs.  EXTREMITIES:  No edema.   Interrogation of his Medtronic ICD was notable for the optimal indices  as above.  There was no evidence of atrial fibrillation.  He is 100%  ventricularly paced.   I reviewed the chest x-ray from June.  He has a single chamber  defibrillator on the left which is still in position.  He has a dual-  chamber defibrillator on the right which is active.   IMPRESSION:  1. Congestive heart failure - Class IIB may be related to      discontinuing his Lasix inadvertently.  2. Ischemic and nonischemic  heart disease without intercurrent chest      pain.  3. Complete heart block status post recent upgrade of his device from      single chamber to dual-chamber with inability to cannulate the      coronary sinus from the right subclavian vein.  4. Myasthenia gravis status post thymoma.  5. Ventricular tachycardia.  6. Sotalol for the above.   DISCUSSION:  Mr. Lippmann has aggravation of his congestive heart  failure.  It may will be that he needs to go for CRT upgrade.  The  problem is going to be how to do this with inability to cannulate from  the right subclavian vein.  The discussion I had with Dr. Ladona Ridgel today  suggested that we might be able to go from the right IJ.  This obviously  eliminates one turn.  In the event that we then tunneled it down to the  right pocket and then removed the left-sided device.  In the event that  it failed, we would then think about referring him for thoracotomy with  tunneling of the right atrial lead, then across the sternum to the left-  sided pocket.  So as to minimize the likelihood of infection, I would  like to minimize the number of incisions into the pocket as well as it  is clear that one of defibrillators needs to be removed.  The timing of  these is not so clear given the aforementioned issues.   Based on that, I will plan to see him again in 3-4 weeks to see how he  is doing on his high-dose of Lasix.  We will then make a decision as to  whether we attempt the right IJ followed by thoracotomy approach for CRT  upgrade.     Duke Salvia, MD, [facility]  Electronically Signed    SCK/MedQ  DD: 01/23/2008  DT: 01/23/2008  Job #: [id number]

## 2010-05-21 NOTE — Op Note (Signed)
NAME:  [name], [name]                       ACCOUNT NO.:  [id number]   MEDICAL RECORD NO.:  [id number]                   PATIENT TYPE:  OIB   LOCATION:  2550                                 FACILITY:  [facility]   PHYSICIAN:  Angelia Mould. Derrell Lolling, M.D.             DATE OF BIRTH:  1950/11/17   DATE OF PROCEDURE:  12/17/2001  DATE OF DISCHARGE:                                 OPERATIVE REPORT   PREOPERATIVE DIAGNOSIS:  Chronic cholecystitis with cholelithiasis.   POSTOPERATIVE DIAGNOSIS:  Chronic cholecystitis with cholelithiasis.   PROCEDURE:  Laparoscopic cholecystectomy with intraoperative cholangiogram.   SURGEON:  Angelia Mould. Derrell Lolling, M.D.   ASSISTANT:  Abigail Miyamoto, M.D.   OPERATIVE INDICATION:  This is a 60 year old white man who has hypertension,  myasthenia gravis, coronary artery disease, and has an internal  defibrillator.  He was hospitalized about three months ago with some upper  abdominal pain and diarrhea and vomiting, which resolved promptly in the  hospital.  Ultrasound and biliary scan were normal.  Hospitalization was  complicated by hyperkalemia, heart block, and congestive heart failure.  That did resolve under cardiology management.  It was never really clear  whether he had an episode of biliary colic.  He has subsequently had another  ultrasound, which shows gallstones and a normal common bile duct.  Although  it is not clear, it is suspected that he may have had an episode of biliary  colic.  He was offered a wait-and-see approach or to go ahead with  cholecystectomy.  He did not want to wait and so he is operated upon  electively.   OPERATIVE FINDINGS:  The gallbladder did appear chronically inflamed, was  discolored, and had fairly extensive adhesions to it.  The anatomy of the  cystic duct, cystic artery, and common bile duct were conventional.  The  intraoperative cholangiogram was normal, there were no filling defects, the  anatomy of the  intrahepatic and extrahepatic biliary tree was normal, and  there was prompt flow of contrast into the duodenum.  Otherwise the liver,  stomach, duodenum, small intestine, large intestine, and peritoneal surfaces  looked normal to inspection.   DESCRIPTION OF PROCEDURE:  Following the induction of general endotracheal  anesthesia, the patient's abdomen was prepped and draped in a sterile  fashion.  Marcaine 0.5% with epinephrine was used as a local infiltration  anesthetic.  A transverse incision was made at the lower rim of the  umbilicus.  The fascia was incised in the midline and the abdominal cavity  entered under direct vision.  A 10 mm Hasson trocar was inserted and secured  with a pursestring suture of 0 Vicryl.  Pneumoperitoneum was created.  The  video camera was inserted with visualization and findings as described  above.  A 10 mm trocar was placed in the subxiphoid region and two 5 mm  trocars placed in the right midabdomen.  The gallbladder fundus was  elevated.  We spent a fair amount of time taking the adhesions down, but  that was done fairly atraumatically and we were able to identify the  infundibulum of the gallbladder, the cystic artery, and the cystic duct.  We  dissected these structures out for a fairly good length to be sure.  A clip  was placed on the cystic duct close to the gallbladder.  A cholangiogram  catheter was inserted into the cystic duct.  A cholangiogram was obtained  using the C-arm.  The cholangiogram was normal.  There were no filling  defects, normal intrahepatic and extrahepatic anatomy, and good flow of  contrast into the duodenum.  The cholangiogram catheter was removed, the  cystic duct was secured with multiple metal clips and divided.  The cystic  artery was secured with multiple metal clips and divided.  The gallbladder  was dissected from its bed with electrocautery and removed.  The operative  field was irrigated.  The irrigation fluid was  completely clear.  There was  no bleeding and no bile leak whatsoever at the completion of the case.  The  trocars were removed under direct vision, and there was no bleeding from the  trocar sites.  The pneumoperitoneum was released.  The fascia at the  umbilicus was closed with 0 Vicryl sutures.  The skin incisions were closed  with subcuticular sutures of 4-0 Vicryl and Steri-Strips.  Clean bandages  were placed and the patient taken to the recovery room in stable condition.  Estimated blood loss was about 10 cc.  Complications:  None.  Sponge,  needle, and instrument counts were correct.                                               Angelia Mould. Derrell Lolling, M.D.    HMI/MEDQ  D:  12/17/2001  T:  12/17/2001  Job:  [id number]   cc:   Osvaldo Shipper. Spruill, M.D.  [address] [address]  [place]  [place] [address]  Fax: [phone]   Learta Codding, M.D. [facility]   [facility] Arita Miss, MD, Neurology Dept

## 2010-05-21 NOTE — Discharge Summary (Signed)
NAMECHARLETON, DEYOUNG             ACCOUNT NO.:  [id number]   MEDICAL RECORD NO.:  [id number]          PATIENT TYPE:  INP   LOCATION:  3017                         FACILITY:  [facility]   PHYSICIAN:  Stefani Dama, M.D.  DATE OF BIRTH:  04-Mar-1950   DATE OF ADMISSION:  04/23/2007  DATE OF DISCHARGE:  04/26/2007                               DISCHARGE SUMMARY   ADMITTING DIAGNOSES:  1. Lumbar spondylosis.  2. Spondylolisthesis at L4-L5 with lumbar radiculopathy.   DISCHARGE DIAGNOSES:  1. Lumbar spondylosis.  2. Spondylolisthesis at L4-L5 with lumbar radiculopathy.  3. Myasthenia gravis.   HOSPITAL COURSE:  [name] is a 60 year old male who has had  a history of myasthenia gravis in the past.  In addition, he has severe  spondylosis and stenosis of his lumbar spine secondary to his  spondylolisthesis at the level of L4-L5.  He has failed efforts of  conservative management and has had weakness in his lower extremities,  and was advised regarding the need for surgical decompression.  He was  explained to do an anterior lumbar antibody decompression arthrodesis.  The patient was taken to the operating room on April 23, 2007, and he  underwent a decompression as stated and he tolerated that well.  Postoperatively, his incision did well; however, the patient did have  difficulty after surgery awakening from anesthesia.  It appears that he  had a prolonged affect from muscle relaxor that was given to him.  He  required intubation for the first night postoperatively.  The patient  was seen by the critical care service and once he was alert enough, we  were able to wean off the ventilator and extubated him the following  hospital day, and that we were able to again mobilize him and get him  back to a chair.  He was extremely weak in all 4 extremities but  gradually this improved.  By the third postoperative day, the patient  was ambulatory.  He was feeling better in terms of his  back and his leg  pain.  His abdominal incision was healing nicely.  He was maintained on  some Lovenox during the postoperative period.  He had no signs of any  leg swelling.  At the time of discharge, he was discontinued from  Lovenox and given pain medication and discharged home.   CONDITION ON DISCHARGE:  Improving.      Stefani Dama, M.D.  Electronically Signed     HJE/MEDQ  D:  05/24/2007  T:  05/25/2007  Job:  [id number]

## 2010-05-21 NOTE — Assessment & Plan Note (Signed)
[facility]                         ELECTROPHYSIOLOGY OFFICE NOTE   NAME:JOHNSONEmidio, Warrell                    MRN:          [id number]  DATE:04/24/2006                            DOB:          09-09-1950    Mr. Kirker was seen today in the clinic on April 24, 2006 for followup  of his Guidant model [id number] Prizm 2. Date of implant was July 09, 1999 for  ventricular tachycardia. On interrogation of his device today, his  battery voltage is 2.5 with a charge time of 15.6 seconds. R waves  measured 7.4 millivolts with a ventricular pacing threshold of 1.8 volts  at 0.6 msec and a ventricular lead impedance of 356 ohms. Shock  impedance was 40. There were no episodes since last interrogation. His  battery is at middle of life 2. We will check him back in 4 months'  time.      Altha Harm, LPN  Electronically Signed      Duke Salvia, MD, [facility]  Electronically Signed   PO/MedQ  DD: 04/24/2006  DT: 04/24/2006  Job #: [id number]

## 2010-05-21 NOTE — H&P (Signed)
NAMEMarland Kitchen  [name], [name]                       ACCOUNT NO.:  [id number]   MEDICAL RECORD NO.:  [id number]                   PATIENT TYPE:  INP   LOCATION:  2107                                 FACILITY:  [facility]   PHYSICIAN:  Angelia Mould. Derrell Lolling, M.D.             DATE OF BIRTH:  04/06/1950   DATE OF ADMISSION:  09/14/2001  DATE OF DISCHARGE:                                HISTORY & PHYSICAL   REASON FOR ADMISSION:  Upper abdominal pain.   HISTORY OF PRESENT ILLNESS:  This is a 60 year old black man with myasthenia  gravis, hypertension, coronary artery disease and internal defibrillator in  place. He presents with a 62-week history of intermittent episodes of  upper abdominal pain. He has not taken his temperature and has no documented  fevers or chills. He has been voiding fine.  He has had a little bit of  diarrhea the past few days.  He vomited today when he came to the [facility]  Emergency Room. He has not been vomiting and was able to eat up to  yesterday.   Following admission to the [facility] ER, the emergency department physician has  evaluated him. An ultrasound was obtained which shows thickening of the  gallbladder wall. A CT scan was obtained which shows thickening of the  gallbladder wall.  No gallstones or biliary ductal dilatation was seen. No  other inflammatory process was noted. I was asked to see him as was [facility]  Cardiology.   PAST MEDICAL HISTORY:  1. Hypertension.  2. Myasthenia gravis, status post thymectomy for malignant thymoma 18 years     ago at [facility] Myasthenia has been stable.  3. Coronary artery disease, status post coronary stent placement.  4. Status post internal cardiac defibrillator placement.  5. History of renal insufficiency.   CURRENT MEDICATIONS:  1. Imuran 150 mg p.o. daily  2. Prednisone 10 mg every other day.  3. Cardizem 180 mg p.o. daily  4. Sotalol 240 mg b.i.d.  5. Nexium 40 mg daily  6. Aspirin 1 tablet daily.  7. Zocor 1 tablet  daily.   DRUG ALLERGIES:  None known.   SOCIAL HISTORY:  Denies the use of alcohol or tobacco. He lives with his  girlfriend who is here with him. He is disabled.   FAMILY HISTORY:  Noncontributory.   REVIEW OF SYSTEMS:  History of arrhythmias in 1997. Internal defibrillator  noted. Myasthenia gravis diagnosed in 1984, back surgery in 1987. He has  dentures.   PHYSICAL EXAMINATION:  GENERAL:  A pleasant, middle-aged man, somewhat  sleepy in no acute distress.  VITAL SIGNS:  Blood pressure 158/84, temperature 98.0, heart rate 40 to 44,  regular, sinus bradycardia by ECG, respiratory rate 22.  HEENT:  Sclerae clear.  Extraocular movements intact. No ptosis.  NECK:  Supple, nontender, no mass.  LUNGS:  Clear anteriorly with somewhat decreased breath sounds at the bases,  some crackles at the bases.  He has well-healed median sternotomy scar and  he has a well-healed transverse left upper anterior chest scar where his  internal cardiac defibrillator is palpable.  HEART:  Regular bradycardia, no murmur noted.  ABDOMEN:  Soft, tender in the epigastrium and right upper quadrant and less  tender elsewhere.  Tender to percuss the right costal margin.  Bowel sounds  hypoactive, no mass, no hernia noted.  EXTREMITY:  No edema, seem to be well vascularized.  NEUROLOGIC:  The patient has no ptosis.  He has good motor strength in his  upper and lower extremities.  Reflexes are 1+ bilaterally. No sign of any  intrinsic weakness.   ADMISSION DATA:  Amylase 75, lipase 20.  Liver function test normal.  White  count 7800, hemoglobin 14.5. Initial potassium 7.5, repeat pending.   IMPRESSION:  1. Suspect acalculous cholecystitis.  Although he has a normal white blood     cell count, he is on prednisone and I am suspicious because of the     physical findings and the computed tomographic scan.  2. Congestive heart failure by chest x-ray.  3. Hyperkalemia, conformation pending.  4. Myasthenia  gravis, status post thymectomy, stable, but on significant     medication.  5. Hypertension.  6. Status post internal cardiac defibrillator.   PLAN:  1. The patient will be admitted, started on IV Unasyn and been kept n.p.o.     except for his medication.  2. [facility] is going to manage his hypertension, congestive heart     failure, cardiac defibrillator and hyperkalemia in the perioperative     periosteal dissection .  3.     We will go ahead with a HIDA scan tonight to make sure that his gallbladder      indeed is non visualizing.  4. He will need cholecystectomy at some point, I suspect over the next 24-48     hours. I plan to give him stress steroid doses because of his myasthenia.                                                       Angelia Mould. Derrell Lolling, M.D.    HMI/MEDQ  D:  09/14/2001  T:  09/16/2001  Job:  [id number]   cc:   Osvaldo Shipper. Spruill, M.D.  [address] [address]  [place]  [place] [address]  Fax: [phone]   Duke Salvia, M.D. [facility]   Learta Codding, M.D. [facility]

## 2010-05-21 NOTE — Op Note (Signed)
[place]  Patient:    [name], [name]                    MRN: [id number] Proc. Date: 07/09/99 Adm. Date:  [id number] Attending:  Nathen May CC:         Electrophysiology Laboratory             Delsa Grana, R.N., [facility]                           Operative Report  PREOPERATIVE DIAGNOSIS:  Ventricular tachycardia - recurrent, status post ICD implantation now at end-of-life.  POSTOPERATIVE DIAGNOSIS:   Ventricular tachycardia - recurrent, status post ICD implantation now at end-of-life.  PROCEDURE:  Explantation of a previously implanted defibrillator and implantation of new with intraoperative defibrillation threshold testing; contrast venography.  HISTORY:  Mr. Hechler is a 60 year old gentleman with myasthenia gravis; he also has a history of coronary artery disease, status post LAD revascularization.  He has a history of ventricular tachycardia.  His previous defibrillator discharges and now takes concomitant sotalol therapy.  He has reached end-of-life.  DESCRIPTION OF PROCEDURE:  Following the obtainment of informed consent, the patient was brought to the electrophysiology laboratory and placed on the fluoroscopic table in the supine position.  After routine prep and drape and under the care of Dr. Demetria Pore, the procedure was undertaken with local anesthesia and conscious sedation.  Please see the accompanying anesthetic record.  Following the introduction of local anesthesia, the incision was opened through the previous incision and carried down to the layer of the defibrillator pocket using electrocautery and blunt dissection.  The scar tissue was very thick and difficult to cut.  The pocket was opened and the defibrillator was explanted.  The lead was freed up.  This was a Medtronic [id number] lead, serial [id number].  The bipolar R-wave was 5.8 mV with an impedance of 310 ohms and a pacing threshold of 0.5  msec of 1.8 volts and a current of threshold at 5.8 mA.  With these acceptable parameters recorded, the lead was then attached to a Guidant Prizm II VRICD model 1860, serial [id number].  Through the device, the bipolar R-wave was 5.4 mV with a pacing impedance of 312 ohms and a pacing threshold of 0.5 msec at 1.6 volts.  The high voltage impedance was 37 ohms.  Impedances were measured in a bipolar mode between the distal tip and both coils.  They both were between 300 and 330 msec.  With these acceptable parameters recorded, defibrillation threshold testing was undertaken.  Ventricular fibrillation was induced via the T-wave shock.  After a total duration of 10 seconds, a 17 joule shock was delivered through a measured resistance of 38 ohms terminating ventricular fibrillation and restoring sinus rhythm.  With these acceptable ______ parameters recorded, the system was implanted.  The pocket was irrigated copiously with antibiotic containing saline solution.  The margins of the pocket were opened. The lead and pulse generator were then placed in the pocket with great care to make sure there was no overlapping of the lead.  It should be noted that the tail of the lead is on the lateral aspect of the pocket, not protected by the can.  The pocket was then closed in three layers in the normal fashion.  The wound was washed, dried and a benzoin, Steri-Strips dressing was then applied. Needle counts and sponge counts and instrument  counts were correct at the end of the procedure according to the nurses. DD:  07/09/99 TD:  07/09/99 Job: [id number]

## 2010-05-21 NOTE — Discharge Summary (Signed)
NAME:  [name], [name]                       ACCOUNT NO.:  [id number]   MEDICAL RECORD NO.:  [id number]                   PATIENT TYPE:  INP   LOCATION:  3734                                 FACILITY:  [facility]   PHYSICIAN:  Duke Salvia, M.D. [facility]           DATE OF BIRTH:  12/28/1950   DATE OF ADMISSION:  09/14/2001  DATE OF DISCHARGE:  09/19/2001                                 DISCHARGE SUMMARY   ADMISSION DIAGNOSES:  1. Bradycardia.  2. Hyperkalemia.  3. Abdominal pain.   HISTORY OF PRESENT ILLNESS:  This is a 60 year old gentleman who has a  history of myasthenia gravis, hypertension, coronary artery disease, status  post ICD, status post PTCA stent, who has a three-week history of  intermittent upper abdominal pain and he was vomiting on the day of  admission.  The patient noted some increasing shortness of breath.  He  stated he had not been taking his Lasix for one week.   HOSPITAL COURSE:  On admission, CAT scan showed acute cholecystitis.  He was  seen by Dr. Derrell Lolling.  The patient was also noted to have a high degree AV  block with bradycardia.  He was V-pacing at 40 beats per minute.  Potassium  on admission was 7.5.  It was thought that it could possibly be secondary to  sotalol.  The patient's sotalol was discontinued.  Heart block resolved off  Cardizem and sotalol.  Dr. Graciela Husbands saw the patient and resumed the patient's  sotalol.  The patient had been noted to be taking herbal solutions and grape  seed extract, grapefruit ________. Surgery was recommended with repeat  ultrasound within two months and a follow-up at their office as an  outpatient.  The patient's potassium gradually decreased and by discharge  his potassium was 3.9.  His sotalol was increased to 240 mg b.i.d. and he  tolerated that well.  The patient was also noted to be hypertensive with a  blood pressure of 150/100 and his Cardizem was restarted.  The patient was  to follow up with Dr. Graciela Husbands in  approximately two months.   DISCHARGE MEDICATIONS:  1. Imuran 150 mg daily.  2. Prednisone 10 mg daily.  3. Sotalol 240 every 12 hours.  4. Nexium 40 daily.  5. Cardizem CD 180 daily.  6. Zocor as before.  7. Enteric-coated aspirin daily.   DIET:  Low fat and low cholesterol diet.    FOLLOW UP:  Follow up with Angelia Mould. Derrell Lolling, M.D. in two months and get a  repeat ultrasound of his gallbladder prior to that office visit.  An  appointment was to be scheduled with Dr. Graciela Husbands in November and the office  will call to set that appointment up.     Chinita Pester, C.R.N.P. [facility]                 Duke Salvia, M.D. [facility]    DS/MEDQ  D:  09/19/2001  T:  09/21/2001  Job:  [id number]   cc:   Duke Salvia, M.D. [facility]   Osvaldo Shipper. Spruill, M.D.  [address] [address]  [place]  [place] [address]  Fax: [phone]
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
## 2010-05-21 NOTE — Assessment & Plan Note (Signed)
[facility]                           ELECTROPHYSIOLOGY OFFICE NOTE   NAME:[name], [name]                    MRN:          [id number]  DATE:09/02/2005                            DOB:          21-Feb-1950    [name] was seen. He has been lost to followup for a couple of  years.  He has an ICD implanted for VT storm with __________, which is  controlled by sotalol and Cardizem and has not been recurrent.  He has had  no problems with chest pain.  He says he also has a prior LAD stent.  He has  myasthenia gravis which is controlled by prednisone and Imuran.   VITAL SIGNS:  On examination, his blood pressure is 126/79, pulse 72.  LUNGS:  Clear.  HEART:  Heart sounds were regular.  EXTREMITIES:  Without edema.   Interrogation of his Guidant Prizm demonstrates an R wave of 6 with  impedance of 344, a threshold of 1.8 V and 0.4 Msec.  Charge time was 15.6.  His battery voltage was 2.58.   He will be seen again in four months.                                   [name] Salvia, MD, [facility]   SCK/MedQ  DD:  09/02/2005  DT:  09/03/2005  Job #:  [id number]   cc:   Osvaldo Shipper. Spruill, MD

## 2010-05-21 NOTE — Discharge Summary (Signed)
[place]. [facility]  Patient:    DRESDEN, LOZITO Visit Number: [id number] MRN: [id number]          Service Type: MED Location: [id number] Attending Physician:  Pola Corn Dictated by:   Mary Hatchett, N.P. Adm. Date:  08/15/2000 Disc. Date: 08/22/2000                             Discharge Summary  DATE OF BIRTH:  May 26, 1919  HISTORY:  Mr. Montanye is a 60 year old black male, who was admitted on August 13, to [facility] through the emergency department.  The patient has a history of myasthenia gravis, coronary artery disease, hypertension, cardiac arrhythmias.  The patient presented to [facility] Emergency Department via EMS with complaints of lethargy and weakness.  The patient stated that symptoms started Saturday night with a mild cough.  His cough became worse and became associated with right chest pain and was pleuritic and short of breath.  On Monday, August 12, the patient saw Dr. Shana Chute in his office where he was given Zithromax.  The patient failed to take prescription medication.  Shortly after his appointment, he developed rigors with increased lethargy with nausea and vomiting.  The patient was seen in the emergency department where his blood pressure was noted to be 70/40 but after two boluses of normal saline, the patients blood pressure did respond to 100/60.  The patient was febrile at that time with a temperature of 101, heart rate 120 beats per minute.  White blood cells were found to be greater than 19,000 with a creatinine of 3. The patient was then admitted to the hospital with a diagnosis of fever of questionable origin, renal insufficiency, sepsis, myasthenia gravis, immunosuppression secondary to Imuran and prednisone.  HOSPITAL COURSE:  Chest x-ray on August 13, showed mild increase in left basilar atelectasis.  The patient was placed on Rocephin and Zithromax and continued to receive IV fluid boluses.   Blood cultures did show gram-positive cocci where the patient continued to remain on Rocephin and Zithromax.  On August 13, it was decided that the patient would be put on pressors and was started on low-dose ______ to maintain adequate perfusion of his organs.  On August 14 ______  was discontinued and the patient continued to receive IV fluid replacement.  The patient was later transferred to the floor where the patient was unable to maintain his blood pressure.  The patient thus can be discharged today.  DISCHARGE DIAGNOSES: 1. Myasthenia gravis. 2. Coronary artery disease. 3. Hypertension. 4. Cardiac arrhythmia. 5. Renal insufficiency. 6. Status post sepsis. 7. Immunosuppression secondary to Imuran and prednisone.  CONDITION:  Stable.  DISCHARGE MEDICATIONS: 1. Zocor 20 mg 1 tablet q.d. 2. Betapace 240 mg 1 p.o. in a.m. and p.m. 3. Protonix 40 mg q.d. 8 a.m. with meals. 4. Diltiazem 180 mg 1 p.o. by mouth q.a.m. 5. Prednisone 10 mg 1 p.o. q.a.m. 6. Imuran 150 mg 1 tablet at 3 p.m. 7. Ceftin 250 mg 1 tablet 2 times a day x 10 days.  ACTIVITY:  Has no restriction.  No diet restrictions.  The patient is to follow up with Dr. Shana Chute in two weeks.  The patient is to call to make an appointment. Dictated by:   Anselm Jungling, N.P. Attending Physician:  Pola Corn DD:  08/22/00 TD:  08/22/00 Job: [id number]

## 2010-05-21 NOTE — Consult Note (Signed)
NAME:  [name], [name]                       ACCOUNT NO.:  [id number]   MEDICAL RECORD NO.:  [id number]                   PATIENT TYPE:  EMS   LOCATION:  MINO                                 FACILITY:  [facility]   PHYSICIAN:  Learta Codding, M.D. [facility]             DATE OF BIRTH:  Feb 12, 1950   DATE OF CONSULTATION:  DATE OF DISCHARGE:                                   CONSULTATION   CURRENT COMPLAINT:  Abdominal pain and shortness of breath.   HISTORY OF PRESENT ILLNESS:  The patient is a 60 year old African-American  male with a history of monomorphic ventricular tachycardia requiring ICD  placement in 1997. The patient underwent a defibrillator change out in 2001  due to end of life status. The patient also has a history of coronary artery  disease with a stent to the LAD placed in 1997. The most recent  catheterization in 2001 demonstrated patent arteries. He has a known  cardiomyopathy with an ejection fraction of 49%, although on his most recent  cardiac catheterization he had normal LV function. Past medical history also  includes myasthenia gravis and hypertension.   The patient is admitted to [facility] this evening due to a three week  history of abdominal pain. The patient reports significant nausea and  occasional vomiting over the last week. He also reports increased pain in  the right upper quadrant. The patient was seen by  Dr. Derrell Lolling for an  evaluation of cholecystitis. A CT scan did demonstrate a possibility of  acute cholecystitis, but the patient has no fever, no white count, and no  elevation in his liver function tests. He has been admitted for rule out  cholecystitis and treated with antibiotics. On admission, however, the  patient was noted to have a slow ventricular rate with a heart rate of 40.  Electrocardiogram confirmed the presence of a complete AV block, right  ventricular pacing at a heart rate of 40 beats per minute. Cardiology  consultation was requested  after further evaluation. The patient also did  report shortness of breath over the last 24 hours with symptoms of orthopnea  and PND. He denied, however, any palpitations or syncope. Chest x-ray in the  emergency room did show mild vascular congestion. His potassium on admission  was 7.5 by ISAT confirmed  by BMET at 6.7. The patient denies any substernal  chest pain and reports no recent exertional chest pain. He has been  compliant with his medications and also was taking Sotalol 240 mg b.i.d.  despite having a creatinine of 1.7. The patient had one myasthenia gravis  status post thymectomy (malignant). Currently on Imuran and  prednisone.   PAST MEDICAL HISTORY:  1. History of ventricular tachycardia diagnosed in 1997, status post ICD at     [facility] in 1997, defibrillator change in 2001 by  Dr. Graciela Husbands followed by Dr.     Graciela Husbands in the office. The patient  has been maintained on Cardizem and     Sotalol.  2. History of cardiomyopathy. Ejection fraction 49% last EF documented in     chart in 2001. Normal EF by cardiac catheterization. Cardiolite study at     that time demonstrated an EF of 49%.  3. History of coronary artery disease status post stent to the LAD in 1997.     No evidence of coronary artery disease by cardiac catheterization in     2001.  4. History of renal insufficiency.  5. History of sepsis with staphylococcus aureus 08/2000.  6. 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  7. Also history of radiation therapy.   SOCIAL HISTORY:  The patient lives in [place]. He lives with a friend. He  does not smoke or drink alcohol. He is a former smoker. He receives  disability. He has no children.   FAMILY HISTORY:  Noncontributory.   ALLERGIES:  NO KNOWN DRUG ALLERGIES.   MEDICATIONS:  Lasix 40 mg a day, prednisone 10 mg p.o. q.d., Nexium 40 mg a  day, Betapace 240 mg b.i.d., Zyrtec q.d., Cardizem CD 180 mg p.o.  q.d.,  Lipitor, Vicodin and Imuran at 150 mg p.o. q.d.   REVIEW OF SYMPTOMS:  As per HPI. Positive for nausea and vomiting. Positive  for abdominal pain. Positive for shortness of breath with exertion within  the last 24 hours. No palpitations or syncope. No melena. No myalgias.  Positive for weakness and numbness, but no motor loss.   PHYSICAL EXAMINATION:  VITAL SIGNS:  Blood pressure 103/64, heart rate 40  beats per minute, respirations 22 per minute, saturation 96% on room air.  GENERAL:  The patient is mildly tachypneic and mildly diaphoretic  complaining of significant nausea.  HEENT:  NCAT, PERRLA. EOMI.  NECK:  Supple. No bruits. No JVD.  HEART:  Regular rate and rhythm. Bradycardic.  Normal S1, S2 paradoxically  split. Defibrillator pocket on the left chest is palpable. There is no  bruit. PMI is nondisplaced.  LUNGS:  Clear. Decreased breath sounds left side with a few crackles at the  bases.  SKIN:  No rash or lesions.  ABDOMEN:  Diffusely tender with significant right upper quadrant tenderness  and bloating. No rebound or guarding.  GI/RECTAL:  Deferred.  EXTREMITIES:  No cyanosis, clubbing, or edema.  NEUROLOGIC:  The patient is alert and oriented and grossly nonfocal. Normal  motor strength throughout.   LABORATORY DATA:  Chest x-ray:  Cardiomegaly, mild CHF, ICD in place and  left elevated hemidiaphragm.  EKG:  Complete AV block, sinus bradycardia,  ventricular paced rhythm at 40 beats per minute.  Glucose is 105, BUN is 28,  sodium 135, potassium 7.4, chloride 108, hemoglobin 17, hematocrit 49, pH is  7.366, pCO2 is 43, bicarb is 25, creatinine is 1.7. White count 7.8,  hemoglobin 14.5, hematocrit 43.4, platelet count is 192. Total protein 6.7,  albumin 3.2, AST 29, ALT 23, alkaline phosphatase 61. Bilirubin 7.5, total  bilirubin 0.5, direct bilirubin 0.2. Amylase is 75, lipase is 20.   IMPRESSION/PLAN: 1. Abdominal pain. The etiology of the patient's abdominal  pain is not     entirely clear. There is some suggestion by CT scan that he could have     acute cholecystitis, but he does not have an elevated white count nor     does he have fever, nor does he have elevated liver function tests.  Dr.  Derrell Lolling is consulting for this problem and has placed the patient on     antibiotic therapy. However, more likely the patient's abdominal pain,     nausea, and vomiting, may be secondary to Sotalol toxicity.  2. Rule out Sotalol toxicity. The patient has multiple stigmata making it     likely that he has significant Sotalol toxicity. The patient takes 240 mg     of Sotalol b.i.d. and only has a creatinine clearance of 30 mL per     minute. His dosing should be q.48h. rather than b.i.d. I suspect this     complete high degree AV block with ventricular pacing is also secondary     to his Sotalol toxicity. We will hold Sotalol and discontinue Cardizem.     We will also increase ventricular backup pacing to minimize the risk of     torsade de pointes. Unfortunately the patient's potassium is also     elevated at 7.5 and this may be contributing to his symptoms. We will     treat this with calcium gluconate, bicarb, glucose and insulin as well as     Kayexalate retention enema. We will have to carefully avoid hypokalemia.     This may further contribute to the risk of torsade de pointes in the     setting of Sotalol toxicity. The use of prednisone as well as Cardizem is     likely contributing also to Sotalol toxicity. All of the above mentioned     symptoms may also be due to this syndrome and the patient may not have an     acute cholecystitis.  3. Status post ICD.  The patient's most recent LV function reportedly was     normal. We have notified the Guidant representative to increase backup     pacing to 60 beats per minute particularly in light of the patient's     hypotension.  4. Hypotension.  Likely due to Sotalol toxicity although the chest x-ray      demonstrates some congestive heart failure, clinically the patient does     not appear to be volume overloaded and actually responded with improved     blood pressure after the saline infusion. We will await the BMP level to     further guide our fluid therapy, but I anticipate the patient will     require actually more fluids.  5. History of CAD. No evidence of ischemia. We will rule out MI and check 2-     D echocardiogram.  6. With his renal insufficiency creatinine is 1.7. I am not sure what the     patient's baseline creatinine is, but on a prior admission his creatinine     was up to 3. This may well be secondary to some degree of volume     depletion.   DISPOSITION:  The patient will be admitted to the ICU. Sotalol and Cardizem  will be held. Hyperkalemia will be treated and backup pacing will be  increased as outlined above.                                               Learta Codding, M.D. [facility]   GED/MEDQ  D:  09/14/2001  T:  09/14/2001  Job:  [id number]   cc:   Duke Salvia, M.D. [facility].  Derrell Lolling, M.D.  Fax: [phone]   Osvaldo Shipper. Spruill, M.D.  [address] [address]  [place]  [place] [address]  Fax: [phone]

## 2010-05-21 NOTE — Assessment & Plan Note (Signed)
[facility]                         ELECTROPHYSIOLOGY OFFICE NOTE   NAME:JOHNSONRaghav, [name]                    MRN:          [id number]  DATE:12/08/2005                            DOB:          1950/04/11    [name] was seen today in the clinic on December 08, 2005 for  followup of his Guidant, Model No. 1860, Prism II.  Date of implant was  July 09, 1999.  On interrogation of his device today, his battery voltage  is 2.58, with a charge time of 15.6 seconds.  R waves measured 7.9 mV,  with a ventricular pacing threshold of 2.4 V at 0.4 msec, and 2 V at 0.6  msec, with a ventricular lead impedance of 356 ohms, and a shock  impedance of 42 ohms.  There were no episodes since last interrogation.  No changes were made in his parameters.  He will be seen in 4 months'  time.      Altha Harm, LPN  Electronically Signed      Duke Salvia, MD, [facility]  Electronically Signed   PO/MedQ  DD: 12/08/2005  DT: 12/09/2005  Job #: [id number]

## 2010-05-21 NOTE — Assessment & Plan Note (Signed)
[facility]                         ELECTROPHYSIOLOGY OFFICE NOTE   NAME:JOHNSONHilda, [name]                    MRN:          [id number]  DATE:02/15/2008                            DOB:          July 05, 1950    [name] is seen in followup for congestive heart failure in the  setting of ischemic and nonischemic heart disease.  He developed  complete heart block and underwent ICD implantation with attempted CRT  placement.  Unfortunately, Dr. Darrick Penna unable to cannulate the coronary  sinus, a dual-chamber pacing was resulted in significant improvement,  and there is an intercurrent deterioration, which has improved since his  last visit, since resumption of his Lasix, which he had discontinued. He  and I spent a long time trying to help me get a sense of what his  functional limitations were and at the end was not able to get a good  handle on that (see below).   CURRENT MEDICATIONS:  1. Imuran.  2. Sotalol 240 b.i.d.  3. Aspirin.  4. Nexium.  5. Lasix.  6. Amoxicillin  7. Recently started Avapro for the cough.  8. Lisinopril at the last visit.   PHYSICAL EXAMINATION:  VITAL SIGNS:  His blood pressure is 128/78 and  pulse of 76.  LUNGS:  Clear.  NECK:  Veins were seven.  HEART:  Sounds were regular without murmurs or gallops.  ABDOMEN:  Soft with active bowel sounds.  EXTREMITIES:  Without edema.   His device was not interrogated.   IMPRESSION:  1. Ischemic and nonischemic heart disease.  2. Class B congestive heart failure - somewhat improved.  3. Status post implantable cardioverter-defibrillator implantation      lifting on the left with a dual chamber device in on the right.  4. Ventricular tachycardia.  5. Sotalol for the above.  6. Myasthenia gravis, status post thymoma.   [name] has persistent problems with heart failure, and the issue on  the table was how can we best quantitate that to see whether it is worth  undertaking  epicardial lead placement and then tunneling.  There is some  urgency and this has the residual left-sided defibrillator really  needs to come out.  We will plan to undertake cardiopulmonary stress  testing to see if we can  quantitate his functional status and then make a decision thereafter as  to what to do about the left-sided defibrillator and potential upgrade  to a CRT device.     Duke Salvia, MD, [facility]  Electronically Signed    SCK/MedQ  DD: 02/15/2008  DT: 02/16/2008  Job #: [id number]

## 2010-05-24 ENCOUNTER — Ambulatory Visit (INDEPENDENT_AMBULATORY_CARE_PROVIDER_SITE_OTHER): Payer: Medicare Other | Admitting: *Deleted

## 2010-05-24 DIAGNOSIS — I442 Atrioventricular block, complete: Secondary | ICD-10-CM

## 2010-05-24 DIAGNOSIS — I5042 Chronic combined systolic (congestive) and diastolic (congestive) heart failure: Secondary | ICD-10-CM

## 2010-05-24 DIAGNOSIS — I472 Ventricular tachycardia: Secondary | ICD-10-CM

## 2010-06-09 ENCOUNTER — Emergency Department (HOSPITAL_COMMUNITY)
Admission: EM | Admit: 2010-06-09 | Discharge: 2010-06-09 | Discharge status: Against Medical Advice | Payer: Medicare Other | Attending: Emergency Medicine | Admitting: Emergency Medicine

## 2010-06-09 DIAGNOSIS — L2989 Other pruritus: Secondary | ICD-10-CM | POA: Insufficient documentation

## 2010-06-09 DIAGNOSIS — L298 Other pruritus: Secondary | ICD-10-CM | POA: Insufficient documentation

## 2010-06-09 DIAGNOSIS — L989 Disorder of the skin and subcutaneous tissue, unspecified: Secondary | ICD-10-CM | POA: Insufficient documentation

## 2010-06-13 ENCOUNTER — Other Ambulatory Visit: Payer: Self-pay | Admitting: Internal Medicine

## 2010-08-25 ENCOUNTER — Ambulatory Visit (INDEPENDENT_AMBULATORY_CARE_PROVIDER_SITE_OTHER): Payer: Medicare Other | Admitting: *Deleted

## 2010-08-25 DIAGNOSIS — I5042 Chronic combined systolic (congestive) and diastolic (congestive) heart failure: Secondary | ICD-10-CM

## 2010-08-25 DIAGNOSIS — I472 Ventricular tachycardia: Secondary | ICD-10-CM

## 2010-08-25 DIAGNOSIS — I2589 Other forms of chronic ischemic heart disease: Secondary | ICD-10-CM

## 2010-08-25 DIAGNOSIS — I442 Atrioventricular block, complete: Secondary | ICD-10-CM

## 2010-08-25 LAB — ICD DEVICE OBSERVATION
AL IMPEDENCE ICD: 360 Ohm
AL THRESHOLD: 0.5 V
ATRIAL PACING ICD: 8.67 pct
CHARGE TIME: 10.089 s
FVT: 0
PACEART VT: 0
TOT-0001: 1
TOT-0002: 0
TZAT-0001ATACH: 2
TZAT-0001FASTVT: 1
TZAT-0002ATACH: NEGATIVE
TZAT-0005FASTVT: 88 pct
TZAT-0012ATACH: 150 ms
TZAT-0013FASTVT: 1
TZAT-0018FASTVT: NEGATIVE
TZAT-0019ATACH: 6 V
TZAT-0019FASTVT: 8 V
TZAT-0020ATACH: 1.5 ms
TZAT-0020ATACH: 1.5 ms
TZAT-0020ATACH: 1.5 ms
TZAT-0020SLOWVT: 1.5 ms
TZON-0003FASTVT: 250 ms
TZON-0003SLOWVT: 400 ms
TZON-0003VSLOWVT: 380 ms
TZON-0004SLOWVT: 16
TZON-0004VSLOWVT: 20
TZST-0001ATACH: 6
TZST-0001FASTVT: 2
TZST-0001FASTVT: 3
TZST-0001FASTVT: 5
TZST-0001FASTVT: 6
TZST-0001SLOWVT: 2
TZST-0001SLOWVT: 3
TZST-0002ATACH: NEGATIVE
TZST-0002SLOWVT: NEGATIVE
TZST-0002SLOWVT: NEGATIVE
TZST-0003FASTVT: 35 J
VENTRICULAR PACING ICD: 99.99 pct

## 2010-08-31 ENCOUNTER — Telehealth: Payer: Self-pay | Admitting: Internal Medicine

## 2010-08-31 NOTE — Telephone Encounter (Signed)
Pt having "needle like " sensation in center of chest, just when he raises his arms for 3 days

## 2010-08-31 NOTE — Telephone Encounter (Signed)
Patient states when raising his right arm he feels like a needle prick in the center of the chest. Patient has a defibrilatior in right side. He would like to know if it is the defibrilator wires that is doing it. Patient had a thymectomy in 1984 and the sternum was put together with staples.

## 2010-08-31 NOTE — Telephone Encounter (Signed)
Dr. Graciela Husbands aware of pt's symptoms, patient has had the defibrillator for a long time and would not produce  this sensation. Md recommends for pt to f/U with his PCP. Patient aware.

## 2010-09-02 ENCOUNTER — Ambulatory Visit (INDEPENDENT_AMBULATORY_CARE_PROVIDER_SITE_OTHER): Payer: Medicare Other | Admitting: *Deleted

## 2010-09-02 DIAGNOSIS — I472 Ventricular tachycardia: Secondary | ICD-10-CM

## 2010-09-02 DIAGNOSIS — I442 Atrioventricular block, complete: Secondary | ICD-10-CM

## 2010-09-02 LAB — ICD DEVICE OBSERVATION
AL AMPLITUDE: 1.9511 mv
BAMS-0001: 170 {beats}/min
BATTERY VOLTAGE: 3.03 V
BRDY-0002RA: 70 {beats}/min
BRDY-0003RA: 130 {beats}/min
LV LEAD IMPEDENCE ICD: 16382 Ohm
RV LEAD IMPEDENCE ICD: 368 Ohm
TOT-0006: 20090619000000
TZAT-0001ATACH: 2
TZAT-0001ATACH: 3
TZAT-0002ATACH: NEGATIVE
TZAT-0002ATACH: NEGATIVE
TZAT-0002SLOWVT: NEGATIVE
TZAT-0012ATACH: 150 ms
TZAT-0012FASTVT: 200 ms
TZAT-0013FASTVT: 1
TZAT-0018ATACH: NEGATIVE
TZAT-0018ATACH: NEGATIVE
TZAT-0018ATACH: NEGATIVE
TZAT-0018SLOWVT: NEGATIVE
TZAT-0019ATACH: 6 V
TZAT-0019ATACH: 6 V
TZAT-0019FASTVT: 8 V
TZAT-0019SLOWVT: 8 V
TZAT-0020FASTVT: 1.5 ms
TZAT-0020SLOWVT: 1.5 ms
TZON-0003VSLOWVT: 380 ms
TZON-0004SLOWVT: 16
TZON-0004VSLOWVT: 20
TZON-0005SLOWVT: 12
TZST-0001ATACH: 5
TZST-0001ATACH: 6
TZST-0001FASTVT: 3
TZST-0001FASTVT: 4
TZST-0001SLOWVT: 2
TZST-0001SLOWVT: 3
TZST-0001SLOWVT: 6
TZST-0002ATACH: NEGATIVE
TZST-0002ATACH: NEGATIVE
TZST-0002SLOWVT: NEGATIVE
TZST-0002SLOWVT: NEGATIVE
TZST-0003FASTVT: 35 J
TZST-0003FASTVT: 35 J
VF: 0

## 2010-09-02 NOTE — Progress Notes (Signed)
ICD interrogation only 

## 2010-09-28 LAB — BASIC METABOLIC PANEL
BUN: 14
BUN: 14
CO2: 24
Chloride: 106
Chloride: 108
Creatinine, Ser: 0.99
Creatinine, Ser: 1.01
GFR calc Af Amer: >60
GFR calc non Af Amer: >60
Potassium: 3.8
Potassium: 5

## 2010-09-28 LAB — BLOOD GAS, ARTERIAL
Acid-base deficit: 5.3 — ABNORMAL HIGH
FIO2: 0.5
MECHVT: 0.6
O2 Saturation: 90.2
Patient temperature: 98.6
RATE: 12
TCO2: 22.2

## 2010-09-28 LAB — CBC
HCT: 38.2 — ABNORMAL LOW
HCT: 38.8 — ABNORMAL LOW
MCHC: 33.8
MCHC: 34.2
MCV: 92.8
MCV: 93.7
Platelets: 217
RBC: 4.14 — ABNORMAL LOW
WBC: 10.6 — ABNORMAL HIGH
WBC: 4.4

## 2010-09-28 LAB — ABO/RH: ABO/RH(D): O POS

## 2010-09-28 LAB — POCT I-STAT 7, (LYTES, BLD GAS, ICA,H+H)
Acid-base deficit: 7 — ABNORMAL HIGH
Bicarbonate: 24.6 — ABNORMAL HIGH
O2 Saturation: 92
Sodium: 139
pO2, Arterial: 89

## 2010-09-28 LAB — TYPE AND SCREEN: ABO/RH(D): O POS

## 2010-09-30 LAB — POCT I-STAT, CHEM 8
BUN: 25 — ABNORMAL HIGH
Calcium, Ion: 1.13
Chloride: 107
Sodium: 141

## 2010-09-30 LAB — COMPREHENSIVE METABOLIC PANEL
AST: 38 — ABNORMAL HIGH
CO2: 25
Chloride: 109
Creatinine, Ser: 0.99
GFR calc Af Amer: >60
GFR calc non Af Amer: >60
Glucose, Bld: 99
Total Bilirubin: 0.7

## 2010-09-30 LAB — CBC
HCT: 40.9
MCHC: 33.2
MCV: 92.8
Platelets: 185
RDW: 14.5
WBC: 5.7

## 2010-09-30 LAB — LIPID PANEL
Cholesterol: 149
LDL Cholesterol: 101 — ABNORMAL HIGH

## 2010-09-30 LAB — PROTIME-INR: Prothrombin Time: 16.7 — ABNORMAL HIGH

## 2010-09-30 LAB — TSH: TSH: 3.063

## 2010-09-30 LAB — MAGNESIUM: Magnesium: 2.1

## 2010-09-30 LAB — CARDIAC PANEL(CRET KIN+CKTOT+MB+TROPI)
CK, MB: 3.5
CK, MB: 3.8
Relative Index: 1.9
Relative Index: 2
Total CK: 193

## 2010-09-30 LAB — CK TOTAL AND CKMB (NOT AT ARMC)
CK, MB: 5.7 — ABNORMAL HIGH
Total CK: 243 — ABNORMAL HIGH

## 2010-09-30 LAB — POCT CARDIAC MARKERS: Troponin i, poc: <0.05

## 2010-09-30 LAB — TROPONIN I: Troponin I: 0.02

## 2010-10-12 LAB — POCT CARDIAC MARKERS
CKMB, poc: 2.3
Myoglobin, poc: 90.1
Operator id: 284251
Troponin i, poc: <0.05

## 2010-10-12 LAB — I-STAT 8, (EC8 V) (CONVERTED LAB)
Bicarbonate: 27.1 — ABNORMAL HIGH
HCT: 42
TCO2: 29
pCO2, Ven: 52 — ABNORMAL HIGH
pH, Ven: 7.324 — ABNORMAL HIGH

## 2010-10-12 LAB — POCT I-STAT CREATININE: Creatinine, Ser: 1.1

## 2011-01-05 ENCOUNTER — Encounter: Payer: Self-pay | Admitting: *Deleted

## 2011-01-07 ENCOUNTER — Encounter: Payer: Medicare Other | Admitting: Internal Medicine

## 2011-01-18 ENCOUNTER — Encounter: Payer: Self-pay | Admitting: Internal Medicine

## 2011-01-18 ENCOUNTER — Ambulatory Visit (INDEPENDENT_AMBULATORY_CARE_PROVIDER_SITE_OTHER): Payer: Medicare Other | Admitting: Internal Medicine

## 2011-01-18 DIAGNOSIS — R5383 Other fatigue: Secondary | ICD-10-CM

## 2011-01-18 DIAGNOSIS — Z95810 Presence of automatic (implantable) cardiac defibrillator: Secondary | ICD-10-CM

## 2011-01-18 DIAGNOSIS — R5381 Other malaise: Secondary | ICD-10-CM

## 2011-01-18 DIAGNOSIS — E7800 Pure hypercholesterolemia, unspecified: Secondary | ICD-10-CM

## 2011-01-18 DIAGNOSIS — I442 Atrioventricular block, complete: Secondary | ICD-10-CM

## 2011-01-18 DIAGNOSIS — I5042 Chronic combined systolic (congestive) and diastolic (congestive) heart failure: Secondary | ICD-10-CM

## 2011-01-18 HISTORY — DX: Presence of automatic (implantable) cardiac defibrillator: Z95.810

## 2011-01-18 LAB — ICD DEVICE OBSERVATION
AL THRESHOLD: 1 V
CHARGE TIME: 10.43 s
FVT: 0
PACEART VT: 0
TOT-0001: 1
TZAT-0001ATACH: 3
TZAT-0001FASTVT: 1
TZAT-0002ATACH: NEGATIVE
TZAT-0005FASTVT: 88 pct
TZAT-0011FASTVT: 10 ms
TZAT-0012ATACH: 150 ms
TZAT-0012SLOWVT: 200 ms
TZAT-0013FASTVT: 1
TZAT-0018ATACH: NEGATIVE
TZAT-0018FASTVT: NEGATIVE
TZAT-0019ATACH: 6 V
TZAT-0019ATACH: 6 V
TZAT-0019SLOWVT: 8 V
TZAT-0020ATACH: 1.5 ms
TZAT-0020ATACH: 1.5 ms
TZAT-0020ATACH: 1.5 ms
TZAT-0020SLOWVT: 1.5 ms
TZON-0003FASTVT: 250 ms
TZON-0003SLOWVT: 400 ms
TZON-0003VSLOWVT: 380 ms
TZON-0004SLOWVT: 16
TZST-0001ATACH: 5
TZST-0001ATACH: 6
TZST-0001FASTVT: 2
TZST-0001FASTVT: 3
TZST-0001FASTVT: 5
TZST-0001SLOWVT: 2
TZST-0001SLOWVT: 3
TZST-0002ATACH: NEGATIVE
TZST-0002ATACH: NEGATIVE
TZST-0002SLOWVT: NEGATIVE
TZST-0002SLOWVT: NEGATIVE
TZST-0002SLOWVT: NEGATIVE
TZST-0003FASTVT: 35 J
TZST-0003FASTVT: 35 J

## 2011-01-18 NOTE — Assessment & Plan Note (Signed)
Stable on current medications 

## 2011-01-18 NOTE — Patient Instructions (Addendum)
Your physician recommends that you return for FASTING lab work tomorrow: bmp/magnesium/lipid/liver/tsh  Your physician recommends that you continue on your current medications as directed. Please refer to the Current Medication list given to you today.  Your physician recommends that you schedule a follow-up appointment in: 3 months with Kristin/Paula for a device check.  Your physician wants you to follow-up in: 1 year with Dr. Graciela Husbands. You will receive a reminder letter in the mail two months in advance. If you don't receive a letter, please call our office to schedule the follow-up appointment.

## 2011-01-18 NOTE — Progress Notes (Signed)
   HPI  [name] is a 61 y.o. male  is seen in followup for ventricular tachycardia in the setting of ischemic and nonischemic heart disease. He is status post ICD implantation.  2010  because of symptoms of progressive shortness of breath, he underwent evaluation including an echo demonstrating an ejection fraction of 40% or so and his Myoview scan demonstrated no ischemia.   The patient denies chest pain, shortness of breath, nocturnal dyspnea, orthopnea or peripheral edema.  There have been no palpitations, lightheadedness or syncope.      Past Medical History  Diagnosis Date  . Chronic back pain   . GERD (gastroesophageal reflux disease)   . Hyperlipidemia   . Myasthenia gravis   . Ventricular tachycardia   . AICD (automatic cardioverter/defibrillator) present     Medtronic Concerto  . Complete heart block 06/2007    h/o transient complete heart block in April 2009  . Sciatica   . DJD (degenerative joint disease)     Past Surgical History  Procedure Date  . Cardiac defibrillator placement 1997    Medtronic Concerto  . Coronary angioplasty with stent placement     bare metal stenting of the LAD in 1997  . Generator change 07/1999  . Generator change 11/23/2006  . Anterior lumbar interbody fusion 04/23/2007    L4-L5     Current Outpatient Prescriptions  Medication Sig Dispense Refill  . aspirin 325 MG tablet Take 325 mg by mouth daily.        Marland Kitchen azaTHIOprine (IMURAN) 50 MG tablet Take 50 mg by mouth daily. 3 po daily       . esomeprazole (NEXIUM) 40 MG capsule Take 40 mg by mouth daily before breakfast.        . furosemide (LASIX) 40 MG tablet TAKE ONE AND ONE-HALF TABLETS BY MOUTH EVERY DAY  60 tablet  12  . Grapefruit Seed 60 % EXTR by Does not apply route 3 (three) times daily.        . potassium chloride SA (K-DUR,KLOR-CON) 20 MEQ tablet Take 20 mEq by mouth daily.        . predniSONE (DELTASONE) 5 MG tablet Take 5 mg by mouth every other day.        .  ramipril (ALTACE) 5 MG capsule Take 5 mg by mouth daily.        . simvastatin (ZOCOR) 20 MG tablet Take 20 mg by mouth at bedtime.        . sotalol (BETAPACE) 120 MG tablet Take 120 mg by mouth 2 (two) times daily.         No Known Allergies  Review of Systems negative except from HPI and PMH  Physical Exam BP 114/72  Pulse 86  Ht 5\' 9"  (1.753 m)  Wt 179 lb (81.194 kg)  BMI 26.43 kg/m2 Well developed and well nourished in no acute distress HENT normal E scleral and icterus clear Neck Supple JVP flat; carotids brisk and full Clear to ausculation Regular rate and rhythm, no murmurs gallops or rub Soft with active bowel sounds No clubbing cyanosis none Edema Alert and oriented, grossly normal motor and sensory function Skin Warm and Dry  Blood per cartilage rim demonstrates P. synchronous pacing  Assessment and  Plan

## 2011-01-18 NOTE — Assessment & Plan Note (Signed)
We will have to assess his LDL status to see if he is at goal.

## 2011-01-18 NOTE — Assessment & Plan Note (Signed)
Device dependent. 

## 2011-01-18 NOTE — Assessment & Plan Note (Signed)
The patient's device was interrogated.  The information was reviewed. No changes were made in the programming.    

## 2011-01-18 NOTE — Assessment & Plan Note (Addendum)
No intercurrent ventricular tachycardia. On sotalol. We'll check magnesium and potassium QT interval is 540 ms with a QRS of 184 a QTC of less than 500

## 2011-01-19 ENCOUNTER — Other Ambulatory Visit (INDEPENDENT_AMBULATORY_CARE_PROVIDER_SITE_OTHER): Payer: Medicare Other | Admitting: *Deleted

## 2011-01-19 DIAGNOSIS — I442 Atrioventricular block, complete: Secondary | ICD-10-CM

## 2011-01-19 DIAGNOSIS — E7800 Pure hypercholesterolemia, unspecified: Secondary | ICD-10-CM

## 2011-01-19 DIAGNOSIS — I5042 Chronic combined systolic (congestive) and diastolic (congestive) heart failure: Secondary | ICD-10-CM

## 2011-01-19 DIAGNOSIS — R5381 Other malaise: Secondary | ICD-10-CM

## 2011-01-19 DIAGNOSIS — R5383 Other fatigue: Secondary | ICD-10-CM

## 2011-01-19 LAB — BASIC METABOLIC PANEL
Calcium: 8.6 mg/dL (ref 8.4–10.5)
GFR: 109.73 mL/min (ref >60.00)
Potassium: 3.7 mEq/L (ref 3.5–5.1)
Sodium: 140 mEq/L (ref 135–145)

## 2011-01-19 LAB — HEPATIC FUNCTION PANEL
AST: 22 U/L (ref 0–37)
Albumin: 3.9 g/dL (ref 3.5–5.2)
Alkaline Phosphatase: 69 U/L (ref 39–117)
Bilirubin, Direct: 0 mg/dL (ref 0.0–0.3)
Total Protein: 7.4 g/dL (ref 6.0–8.3)

## 2011-01-19 LAB — LIPID PANEL
Cholesterol: 173 mg/dL (ref 0–200)
HDL: 49.5 mg/dL (ref >39.00)
Triglycerides: 99 mg/dL (ref 0.0–149.0)

## 2011-01-19 LAB — MAGNESIUM: Magnesium: 1.7 mg/dL (ref 1.5–2.5)

## 2011-01-25 ENCOUNTER — Telehealth: Payer: Self-pay | Admitting: Internal Medicine

## 2011-01-25 DIAGNOSIS — I4891 Unspecified atrial fibrillation: Secondary | ICD-10-CM

## 2011-01-25 NOTE — Telephone Encounter (Signed)
I spoke with the patient. He stated Dr. Graciela Husbands wanted him to take a baby aspirin once daily and he wanted to know the strength. I informed him ASA 81 mg once daily. The patient is also aware of his lab results. He will start magesium 400mg  once daily and come for a repeat magnesium level on 02/22/11 per Dr. Odessa Fleming recommendations.

## 2011-01-25 NOTE — Telephone Encounter (Signed)
New Msg: pt calling wanting to speak with nurse/MD wanting to know how much baby aspirin Dr. Graciela Husbands would like pt to take. Please return pt call to discuss further.

## 2011-02-14 ENCOUNTER — Telehealth: Payer: Self-pay | Admitting: Internal Medicine

## 2011-02-14 DIAGNOSIS — I4891 Unspecified atrial fibrillation: Secondary | ICD-10-CM

## 2011-02-14 MED ORDER — MAGNESIUM OXIDE 400 MG PO TABS: 400.0000 mg | ORAL_TABLET | Freq: Every day | ORAL | Status: DC

## 2011-02-14 NOTE — Telephone Encounter (Signed)
New Problem:    Patient called in because he was unable to find 400mg  potassium pills and was wonderign what he should do or where he could find them.  Please call back.

## 2011-02-14 NOTE — Telephone Encounter (Signed)
I spoke with the patient. He has been unable to find magnesium 400 mg OTC. He states he came by here to clarify this and was told it was potassium he was supposed to be taking. I explained this is not the case. I will call him in a RX for magesium oxide 400 mg once daily to [facility] at [address]. He will come around 03/14/11 for his repeat magnesium level.

## 2011-03-14 ENCOUNTER — Other Ambulatory Visit (INDEPENDENT_AMBULATORY_CARE_PROVIDER_SITE_OTHER): Payer: Medicare Other

## 2011-03-14 DIAGNOSIS — I4891 Unspecified atrial fibrillation: Secondary | ICD-10-CM

## 2011-03-17 ENCOUNTER — Other Ambulatory Visit: Payer: Self-pay | Admitting: *Deleted

## 2011-03-17 DIAGNOSIS — I4891 Unspecified atrial fibrillation: Secondary | ICD-10-CM

## 2011-03-18 ENCOUNTER — Encounter: Payer: Self-pay | Admitting: Internal Medicine

## 2011-04-06 ENCOUNTER — Encounter: Payer: Self-pay | Admitting: Internal Medicine

## 2011-04-20 ENCOUNTER — Encounter: Payer: Self-pay | Admitting: Internal Medicine

## 2011-04-20 ENCOUNTER — Ambulatory Visit (INDEPENDENT_AMBULATORY_CARE_PROVIDER_SITE_OTHER): Payer: Medicare Other | Admitting: *Deleted

## 2011-04-20 DIAGNOSIS — I5042 Chronic combined systolic (congestive) and diastolic (congestive) heart failure: Secondary | ICD-10-CM

## 2011-04-20 DIAGNOSIS — I472 Ventricular tachycardia, unspecified: Secondary | ICD-10-CM

## 2011-04-20 DIAGNOSIS — I442 Atrioventricular block, complete: Secondary | ICD-10-CM

## 2011-04-20 LAB — ICD DEVICE OBSERVATION
AL AMPLITUDE: 2.036 mv
ATRIAL PACING ICD: 2.94 pct
BAMS-0001: 170 {beats}/min
CHARGE TIME: 10.43 s
FVT: 0
LV LEAD IMPEDENCE ICD: 16382 Ohm
RV LEAD IMPEDENCE ICD: 376 Ohm
TOT-0001: 1
TZAT-0001ATACH: 2
TZAT-0001FASTVT: 1
TZAT-0002ATACH: NEGATIVE
TZAT-0002ATACH: NEGATIVE
TZAT-0002SLOWVT: NEGATIVE
TZAT-0012ATACH: 150 ms
TZAT-0012FASTVT: 200 ms
TZAT-0013FASTVT: 1
TZAT-0018ATACH: NEGATIVE
TZAT-0018ATACH: NEGATIVE
TZAT-0018FASTVT: NEGATIVE
TZAT-0019ATACH: 6 V
TZAT-0019ATACH: 6 V
TZAT-0019FASTVT: 8 V
TZAT-0019SLOWVT: 8 V
TZAT-0020FASTVT: 1.5 ms
TZON-0004VSLOWVT: 20
TZON-0005SLOWVT: 12
TZST-0001FASTVT: 2
TZST-0001FASTVT: 4
TZST-0001FASTVT: 6
TZST-0001SLOWVT: 3
TZST-0002ATACH: NEGATIVE
TZST-0002SLOWVT: NEGATIVE
TZST-0002SLOWVT: NEGATIVE
TZST-0002SLOWVT: NEGATIVE
TZST-0002SLOWVT: NEGATIVE
TZST-0003FASTVT: 35 J
TZST-0003FASTVT: 35 J
VENTRICULAR PACING ICD: 99.99 pct
VF: 0

## 2011-04-20 NOTE — Progress Notes (Signed)
ICD check with ICM 

## 2011-04-22 ENCOUNTER — Telehealth: Payer: Self-pay | Admitting: *Deleted

## 2011-04-22 NOTE — Telephone Encounter (Signed)
Message received from [place] in device clinic. They had just seen the patient and he wanted to know if he still needed to be on his potassium. In reviewing his chart, I do not see documentation to stop this. He is also on sotalol, so he should be taking this with his magnesium. I have left a message for him to call.

## 2011-04-27 NOTE — Telephone Encounter (Signed)
I left a message for the patient to call. 

## 2011-05-03 ENCOUNTER — Encounter: Payer: Self-pay | Admitting: *Deleted

## 2011-05-03 NOTE — Telephone Encounter (Signed)
No call back from the patient. A letter was mailed to him stating he should be on both potassium and magnesium secondary to sotalol.

## 2011-07-12 ENCOUNTER — Other Ambulatory Visit: Payer: Self-pay | Admitting: Internal Medicine

## 2011-07-14 ENCOUNTER — Institutional Professional Consult (permissible substitution): Payer: PRIVATE HEALTH INSURANCE | Admitting: Internal Medicine

## 2011-07-28 ENCOUNTER — Encounter: Payer: PRIVATE HEALTH INSURANCE | Admitting: *Deleted

## 2011-07-29 ENCOUNTER — Encounter (HOSPITAL_COMMUNITY): Payer: Self-pay | Admitting: Emergency Medicine

## 2011-07-29 ENCOUNTER — Emergency Department (HOSPITAL_COMMUNITY)
Admission: EM | Admit: 2011-07-29 | Discharge: 2011-07-29 | Disposition: A | Discharge status: Home / Self Care | Payer: PRIVATE HEALTH INSURANCE | Attending: Emergency Medicine | Admitting: Emergency Medicine

## 2011-07-29 DIAGNOSIS — Z95810 Presence of automatic (implantable) cardiac defibrillator: Secondary | ICD-10-CM | POA: Insufficient documentation

## 2011-07-29 DIAGNOSIS — L0293 Carbuncle, unspecified: Secondary | ICD-10-CM

## 2011-07-29 DIAGNOSIS — I2510 Atherosclerotic heart disease of native coronary artery without angina pectoris: Secondary | ICD-10-CM | POA: Insufficient documentation

## 2011-07-29 DIAGNOSIS — Z87891 Personal history of nicotine dependence: Secondary | ICD-10-CM | POA: Insufficient documentation

## 2011-07-29 DIAGNOSIS — Z8249 Family history of ischemic heart disease and other diseases of the circulatory system: Secondary | ICD-10-CM | POA: Insufficient documentation

## 2011-07-29 DIAGNOSIS — K219 Gastro-esophageal reflux disease without esophagitis: Secondary | ICD-10-CM | POA: Insufficient documentation

## 2011-07-29 DIAGNOSIS — L02239 Carbuncle of trunk, unspecified: Secondary | ICD-10-CM | POA: Insufficient documentation

## 2011-07-29 DIAGNOSIS — M1990 Unspecified osteoarthritis, unspecified site: Secondary | ICD-10-CM | POA: Insufficient documentation

## 2011-07-29 DIAGNOSIS — L02229 Furuncle of trunk, unspecified: Secondary | ICD-10-CM | POA: Insufficient documentation

## 2011-07-29 DIAGNOSIS — Z9861 Coronary angioplasty status: Secondary | ICD-10-CM | POA: Insufficient documentation

## 2011-07-29 DIAGNOSIS — Z981 Arthrodesis status: Secondary | ICD-10-CM | POA: Insufficient documentation

## 2011-07-29 DIAGNOSIS — E785 Hyperlipidemia, unspecified: Secondary | ICD-10-CM | POA: Insufficient documentation

## 2011-07-29 MED ORDER — SULFAMETHOXAZOLE-TRIMETHOPRIM 800-160 MG PO TABS: 1.0000 | ORAL_TABLET | Freq: Two times a day (BID) | ORAL | Status: AC

## 2011-07-29 NOTE — ED Notes (Signed)
Pt reports possibly bit by brown recluse spider. Pt unsure when he was bit but noticed the bite 3 days ago. Bump noted to right mid back area.

## 2011-07-29 NOTE — ED Provider Notes (Signed)
History     CSN: [id number]  Arrival date & time 07/29/11  0814   First MD Initiated Contact with Patient 07/29/11 0831      Chief Complaint  Patient presents with  . Insect Bite    (Consider location/radiation/quality/duration/timing/severity/associated sxs/prior treatment) HPI Comments: [name] is a 61 y.o. Male who complains of a sore area on his back for several days, and being concerned about a possible spider bite as the cause. He has pain and swelling at the suspect area. No associated fever, chills, nausea, vomiting, weakness, dizziness, paresthesias, or gait abnormality. He has been eating well. He has not tried any medicine today for the problem. He is using his usual medicines as prescribed.  The history is provided by the patient.    Past Medical History  Diagnosis Date  . Chronic back pain   . GERD (gastroesophageal reflux disease)   . Hyperlipidemia   . Myasthenia gravis   . Ventricular tachycardia   . AICD (automatic cardioverter/defibrillator) present     Medtronic Concerto  . Complete heart block 06/2007    h/o transient complete heart block in April 2009  . Sciatica   . DJD (degenerative joint disease)     Past Surgical History  Procedure Date  . Cardiac defibrillator placement 1997    Medtronic Concerto  . Coronary angioplasty with stent placement     bare metal stenting of the LAD in 1997  . Generator change 07/1999  . Generator change 11/23/2006  . Anterior lumbar interbody fusion 04/23/2007    L4-L5     Family History  Problem Relation Age of Onset  . Coronary artery disease Brother     History  Substance Use Topics  . Smoking status: Former Smoker    Quit date: 01/03/1982  . Smokeless tobacco: Never Used  . Alcohol Use: Yes     occasional      Review of Systems  All other systems reviewed and are negative.    Allergies  Review of patient's allergies indicates no known allergies.  Home Medications   Current  Outpatient Rx  Name Route Sig Dispense Refill  . ASPIRIN EC 81 MG PO TBEC Oral Take 1 tablet (81 mg total) by mouth daily.    . AZATHIOPRINE 50 MG PO TABS Oral Take 150 mg by mouth daily.     . FUROSEMIDE 40 MG PO TABS Oral Take 60 mg by mouth daily.    Marland Kitchen GRAPEFRUIT SEED 60 % EXTR Does not apply by Does not apply route 3 (three) times daily.      Marland Kitchen HYDROCODONE-ACETAMINOPHEN 5-500 MG PO TABS Oral Take 1-2 tablets by mouth every 6 (six) hours as needed. For pain    . MAGNESIUM OXIDE 400 MG PO TABS Oral Take 400 mg by mouth 2 (two) times daily.     Marland Kitchen OMEPRAZOLE 20 MG PO CPDR Oral Take 20 mg by mouth daily.    Marland Kitchen OVER THE COUNTER MEDICATION Oral Take 1-2 tablets by mouth daily. "blue green algae"    . POTASSIUM CHLORIDE CRYS ER 20 MEQ PO TBCR Oral Take 20 mEq by mouth daily.      Marland Kitchen PREDNISONE 5 MG PO TABS Oral Take 5 mg by mouth every other day.      Marland Kitchen RAMIPRIL 5 MG PO CAPS Oral Take 5 mg by mouth daily.      Marland Kitchen SIMVASTATIN 20 MG PO TABS Oral Take 20 mg by mouth at bedtime.      Marland Kitchen  SOTALOL HCL 120 MG PO TABS Oral Take 120 mg by mouth 2 (two) times daily.     . SULFAMETHOXAZOLE-TRIMETHOPRIM 800-160 MG PO TABS Oral Take 1 tablet by mouth 2 (two) times daily. 20 tablet 0    BP 125/76  Pulse 71  Temp 98 F (36.7 C) (Oral)  Resp 20  SpO2 99%  Physical Exam  Nursing note and vitals reviewed. Constitutional: He is oriented to person, place, and time. He appears well-developed and well-nourished.  HENT:  Head: Normocephalic and atraumatic.  Right Ear: External ear normal.  Left Ear: External ear normal.  Eyes: Conjunctivae and EOM are normal. Pupils are equal, round, and reactive to light.  Neck: Normal range of motion and phonation normal. Neck supple.  Cardiovascular: Normal rate.   Pulmonary/Chest: Effort normal. He exhibits no bony tenderness.  Abdominal: Soft. Normal appearance.  Musculoskeletal: Normal range of motion.  Neurological: He is alert and oriented to person, place, and time.  He has normal strength. No cranial nerve deficit or sensory deficit. He exhibits normal muscle tone. Coordination normal.  Skin: Skin is warm, dry and intact.       Right mid back with 2 cm, indurated area, and sent to tiny pustular abnormality. There is no associated fluctuance, or redness. No visible or palpable foreign body.  Psychiatric: He has a normal mood and affect. His behavior is normal. Judgment and thought content normal.    ED Course  Procedures (including critical care time)      1. Carbuncle       MDM  Carbuncle, without evidence for frank abscess. Doubt cellulitis. There is no indication for drainage of this abnormality at this time.   Plan: Home Medications- Septra; Home Treatments- heat applications; Recommended follow up- Return prn       Flint Melter, MD 07/29/11 1207

## 2011-08-02 ENCOUNTER — Encounter: Payer: Self-pay | Admitting: *Deleted

## 2011-08-15 ENCOUNTER — Emergency Department (HOSPITAL_COMMUNITY)
Admission: EM | Admit: 2011-08-15 | Discharge: 2011-08-15 | Discharge status: Against Medical Advice | Payer: PRIVATE HEALTH INSURANCE | Attending: Emergency Medicine | Admitting: Emergency Medicine

## 2011-08-15 ENCOUNTER — Telehealth: Payer: Self-pay | Admitting: *Deleted

## 2011-08-15 ENCOUNTER — Encounter (HOSPITAL_COMMUNITY): Payer: Self-pay

## 2011-08-15 DIAGNOSIS — R42 Dizziness and giddiness: Secondary | ICD-10-CM | POA: Insufficient documentation

## 2011-08-15 DIAGNOSIS — I10 Essential (primary) hypertension: Secondary | ICD-10-CM | POA: Insufficient documentation

## 2011-08-15 DIAGNOSIS — R51 Headache: Secondary | ICD-10-CM | POA: Insufficient documentation

## 2011-08-15 LAB — POCT I-STAT, CHEM 8
BUN: 14 mg/dL (ref 6–23)
Calcium, Ion: 1.22 mmol/L (ref 1.13–1.30)
Chloride: 103 mEq/L (ref 96–112)

## 2011-08-15 LAB — CBC WITH DIFFERENTIAL/PLATELET
Basophils Absolute: 0.1 10*3/uL (ref 0.0–0.1)
HCT: 42.1 % (ref 39.0–52.0)
Hemoglobin: 14.3 g/dL (ref 13.0–17.0)
Lymphocytes Relative: 31 % (ref 12–46)
Monocytes Absolute: 0.6 10*3/uL (ref 0.1–1.0)
Monocytes Relative: 12 % (ref 3–12)
Neutro Abs: 2.4 10*3/uL (ref 1.7–7.7)
RDW: 12.8 % (ref 11.5–15.5)
WBC: 4.6 10*3/uL (ref 4.0–10.5)

## 2011-08-15 LAB — POCT I-STAT TROPONIN I: Troponin i, poc: 0 ng/mL (ref 0.00–0.08)

## 2011-08-15 NOTE — ED Notes (Signed)
Headache, body aches, dizzy and htn, ongoign 3 days.

## 2011-08-15 NOTE — Telephone Encounter (Signed)
Pt walked into office wanting to someone to see him for hypertension--advised pt to go PCP or ED or urgent care--pt agrees--nt

## 2011-08-17 ENCOUNTER — Ambulatory Visit (INDEPENDENT_AMBULATORY_CARE_PROVIDER_SITE_OTHER): Payer: PRIVATE HEALTH INSURANCE | Admitting: Nurse Practitioner

## 2011-08-17 ENCOUNTER — Encounter: Payer: Self-pay | Admitting: Internal Medicine

## 2011-08-17 ENCOUNTER — Ambulatory Visit (INDEPENDENT_AMBULATORY_CARE_PROVIDER_SITE_OTHER): Payer: PRIVATE HEALTH INSURANCE | Admitting: *Deleted

## 2011-08-17 ENCOUNTER — Encounter: Payer: Self-pay | Admitting: Nurse Practitioner

## 2011-08-17 VITALS — BP 107/71 | HR 75 | Ht 69.0 in | Wt 181.0 lb

## 2011-08-17 DIAGNOSIS — R0989 Other specified symptoms and signs involving the circulatory and respiratory systems: Secondary | ICD-10-CM

## 2011-08-17 DIAGNOSIS — I2589 Other forms of chronic ischemic heart disease: Secondary | ICD-10-CM

## 2011-08-17 DIAGNOSIS — I2510 Atherosclerotic heart disease of native coronary artery without angina pectoris: Secondary | ICD-10-CM

## 2011-08-17 DIAGNOSIS — I442 Atrioventricular block, complete: Secondary | ICD-10-CM

## 2011-08-17 DIAGNOSIS — R079 Chest pain, unspecified: Secondary | ICD-10-CM

## 2011-08-17 DIAGNOSIS — I472 Ventricular tachycardia: Secondary | ICD-10-CM

## 2011-08-17 DIAGNOSIS — R0789 Other chest pain: Secondary | ICD-10-CM

## 2011-08-17 DIAGNOSIS — R072 Precordial pain: Secondary | ICD-10-CM

## 2011-08-17 HISTORY — DX: Other chest pain: R07.89

## 2011-08-17 LAB — ICD DEVICE OBSERVATION
AL AMPLITUDE: 1.9 mv
AL THRESHOLD: 1 V
BAMS-0001: 170 {beats}/min
BATTERY VOLTAGE: 2.93 V
LV LEAD IMPEDENCE ICD: 16382 Ohm
RV LEAD IMPEDENCE ICD: 408 Ohm
RV LEAD THRESHOLD: 1 V
TOT-0002: 0
TOT-0006: 20090619000000
TZAT-0001ATACH: 1
TZAT-0001ATACH: 2
TZAT-0001ATACH: 3
TZAT-0001SLOWVT: 1
TZAT-0002ATACH: NEGATIVE
TZAT-0002ATACH: NEGATIVE
TZAT-0002SLOWVT: NEGATIVE
TZAT-0004FASTVT: 8
TZAT-0005FASTVT: 88 pct
TZAT-0011FASTVT: 10 ms
TZAT-0012ATACH: 150 ms
TZAT-0012ATACH: 150 ms
TZAT-0012FASTVT: 200 ms
TZAT-0012SLOWVT: 200 ms
TZAT-0018ATACH: NEGATIVE
TZAT-0018ATACH: NEGATIVE
TZAT-0018ATACH: NEGATIVE
TZAT-0018SLOWVT: NEGATIVE
TZAT-0019ATACH: 6 V
TZAT-0019SLOWVT: 8 V
TZAT-0020ATACH: 1.5 ms
TZON-0003ATACH: 350 ms
TZON-0005SLOWVT: 12
TZST-0001ATACH: 4
TZST-0001ATACH: 5
TZST-0001ATACH: 6
TZST-0001FASTVT: 3
TZST-0001FASTVT: 6
TZST-0001SLOWVT: 4
TZST-0001SLOWVT: 5
TZST-0001SLOWVT: 6
TZST-0002ATACH: NEGATIVE
TZST-0002SLOWVT: NEGATIVE
TZST-0002SLOWVT: NEGATIVE
TZST-0002SLOWVT: NEGATIVE
TZST-0003FASTVT: 35 J
TZST-0003FASTVT: 35 J
TZST-0003FASTVT: 35 J
VENTRICULAR PACING ICD: 99.99 pct
VF: 0

## 2011-08-17 NOTE — Patient Instructions (Signed)
Your physician has requested that you have en exercise stress myoview. For further information please visit [email or web address] Please follow instruction sheet, as given.  Your physician recommends that you schedule a follow-up appointment in 2 weeks with either Tereso Newcomer, PA-C or Dr. Graciela Husbands   Your physician recommends that you continue on your current medications as directed. Please refer to the Current Medication list given to you today.

## 2011-08-17 NOTE — Progress Notes (Signed)
icd check in clinic  

## 2011-08-17 NOTE — Progress Notes (Signed)
Patient Name: [name] Date of Encounter: 08/17/2011  Primary Care Provider:  Pola Corn, MD Primary Cardiologist:  Reece Agar. Ladona Ridgel, MD  Patient Profile  61 y/o male with h/o CAD, ICM, VT who presents with c/o chest pain in setting of emotional upset.  Problem List   Past Medical History  Diagnosis Date  . Chronic back pain     a. s/p multiple lumbar surgeries  . GERD (gastroesophageal reflux disease)   . Hyperlipidemia   . Myasthenia gravis   . Monomorphic ventricular tachycardia     a. s/p ICD 1997;   b. 06/2007 ICD upgrade to MDT Concerto Bi-V though LV lead unable to be placed.  . Complete heart block     a. h/o transient complete heart block in April 2009  . Sciatica   . DJD (degenerative joint disease)   . Ischemic cardiomyopathy     a. 05/2009 Echo: EF 40%, mild to mod glob HK, Gr 2 dd, mild LVH, Triv AI, Mild MR, PASP 37-61mmHg  . CAD (coronary artery disease)     a. s/p PCI/BMS of prox LAD in 1997;  b. 05/2010 cath: nonobs dzs.   Past Surgical History  Procedure Date  . Cardiac defibrillator placement 1997    Medtronic Concerto  . Coronary angioplasty with stent placement     bare metal stenting of the LAD in 1997  . Generator change 07/1999  . Generator change 11/23/2006  . Anterior lumbar interbody fusion 04/23/2007    L4-L5     Allergies  No Known Allergies  HPI  61 y/o male with the above problem list.  He was in his USOH until 2 wks ago, when his significant other informed him that she was in love with another man, resulting in significant emotional upset accompanied by midsternal chest pain w/o associated Ss.  Discomfort lasts hours at a time and is only improved after emotional upset improves.  He had been experiencing both the upset and discomfort daily, lasting anywhere between 15 mins and a few hours.  His last episode was 2 days ago and he says now that he has a better handle on his home situation.  He was seen in the ED on 8/12 for chest  pain.  ECG showed paced rhythm w/o acute changes.  His troponin was normal.  He left AMA from the ER.  He has had no further chest pain.  He denies pnd, orthopnea, n, v, dizziness, syncope, edema, weight gain, or early satiety.  Home Medications  Prior to Admission medications   Medication Sig Start Date End Date Taking? Authorizing Provider  aspirin EC 81 MG tablet Take 1 tablet (81 mg total) by mouth daily. 01/25/11  Yes Duke Salvia, MD  azaTHIOprine (IMURAN) 50 MG tablet Take 150 mg by mouth daily.    Yes Historical Provider, MD  furosemide (LASIX) 40 MG tablet Take 60 mg by mouth daily.   Yes Historical Provider, MD  magnesium oxide (MAG-OX 400) 400 MG tablet Take 400 mg by mouth 2 (two) times daily.  03/17/11  Yes Duke Salvia, MD  Nutritional Supplements (GRAPESEED EXTRACT PO) Take 2 capsules by mouth daily.   Yes Historical Provider, MD  omeprazole (PRILOSEC) 20 MG capsule Take 20 mg by mouth daily.   Yes Historical Provider, MD  OVER THE COUNTER MEDICATION Take 1-2 tablets by mouth daily. "blue green algae"   Yes Historical Provider, MD  OVER THE COUNTER MEDICATION Take 2 tablets by mouth 2 (two)  times daily. Health food store digest enzymes tablets.   Yes Historical Provider, MD  potassium chloride SA (K-DUR,KLOR-CON) 20 MEQ tablet Take 20 mEq by mouth daily.    Yes Historical Provider, MD  predniSONE (DELTASONE) 5 MG tablet Take 5 mg by mouth every other day.     Yes Historical Provider, MD  ramipril (ALTACE) 5 MG capsule Take 5 mg by mouth daily.     Yes Historical Provider, MD  simvastatin (ZOCOR) 20 MG tablet Take 20 mg by mouth at bedtime.     Yes Historical Provider, MD  sotalol (BETAPACE) 120 MG tablet Take 120 mg by mouth 2 (two) times daily.    Yes Historical Provider, MD   Review of Systems  Emotional upset/stress and chest pain as outlined above.  All other systems reviewed and are otherwise negative except as noted above.  Physical Exam  Blood pressure 107/71,  pulse 75, height 5\' 9"  (1.753 m), weight 181 lb (82.101 kg).  General: Pleasant, NAD Psych: Normal affect. Neuro: Alert and oriented X 3. Moves all extremities spontaneously. HEENT: Normal  Neck: Supple without bruits or JVD. Lungs:  Resp regular and unlabored, CTA. Heart: RRR no s3, s4, or murmurs. Abdomen: Soft, non-tender, non-distended, BS + x 4.  Extremities: No clubbing, cyanosis or edema. DP/PT/Radials 2+ and equal bilaterally.  Accessory Clinical Findings  ECG - From ER 8/12 - A sense, V pace, 72  Assessment & Plan  1.  Midsternal chest pain/CAD:  Pt presents with a 2 wk h/o midsternal chest pain occurring exclusively in the setting of emotional upset.  Despite often prolonged Ss (hours @ a time), he had a negative troponin when seen in the ED on Monday.  He has had no further chest pain since then.  We will obtain an exercise myoview to r/o ischemia.  Cont asa, statin.  2.  ICM:  Euvolemic.  Last EF 40% by echo in 2011.  Cont acei, lasix.  He is not on toprol/coreg b/c he is on sotalol for h/o VT.  3.  VT:  Pt is unaware of any recent episodes.  He's had no device shocks and denies syncope.  As above, he remains on sotalol.  He is a month overdue for his ICD check and we have arranged for that to take place today.  4.  HL:  Cont statin therapy.  LDL 103 in January.  5.  Myasthenia Gravis:  On Imuran.  Nicolasa Ducking, NP 08/17/2011, 4:13 PM

## 2011-08-23 ENCOUNTER — Ambulatory Visit (HOSPITAL_COMMUNITY): Payer: Medicare Other | Attending: Cardiology | Admitting: Radiology

## 2011-08-23 VITALS — BP 98/66 | Ht 69.0 in | Wt 175.0 lb

## 2011-08-23 DIAGNOSIS — R079 Chest pain, unspecified: Secondary | ICD-10-CM | POA: Insufficient documentation

## 2011-08-23 DIAGNOSIS — I428 Other cardiomyopathies: Secondary | ICD-10-CM | POA: Insufficient documentation

## 2011-08-23 DIAGNOSIS — I2510 Atherosclerotic heart disease of native coronary artery without angina pectoris: Secondary | ICD-10-CM

## 2011-08-23 DIAGNOSIS — I999 Unspecified disorder of circulatory system: Secondary | ICD-10-CM | POA: Insufficient documentation

## 2011-08-23 MED ORDER — TECHNETIUM TC 99M TETROFOSMIN IV KIT
32.8000 | PACK | Freq: Once | INTRAVENOUS | Status: AC | PRN
  Administered 2011-08-23: 32.8 via INTRAVENOUS

## 2011-08-23 MED ORDER — ADENOSINE (DIAGNOSTIC) 3 MG/ML IV SOLN
0.5600 mg/kg | Freq: Once | INTRAVENOUS | Status: AC
  Administered 2011-08-23: 44.4 mg via INTRAVENOUS

## 2011-08-23 MED ORDER — TECHNETIUM TC 99M TETROFOSMIN IV KIT
10.9000 | PACK | Freq: Once | INTRAVENOUS | Status: AC | PRN
  Administered 2011-08-23: 10.9 via INTRAVENOUS

## 2011-08-23 NOTE — Progress Notes (Signed)
[facility] SITE 3 NUCLEAR MED [address] [place] [address] [phone]  Cardiology Nuclear Med Study  [name] is a 61 y.o. male     MRN : [id number]     DOB: July 05, 1950  Procedure Date: 08/23/2011  Nuclear Med Background Indication for Stress Test:  Evaluation for Ischemia, Stent Patency and Post Hospital:ER with Chest pain,EKG with no acute changes and Troponin normal. History:  Cardiomyopathy, ICM, VT, 1997: LAD and Defibrillator; upgrade 2009 04/2008 Heart Cath: N/O Dz EF: 55%, 5/09/11MPS: (-) ischemia 5/11 ECHO: EF: 40% mild LVH, Trivial AI   Cardiac Risk Factors: Lipids  Symptoms:  Chest Pain   Nuclear Pre-Procedure Caffeine/Decaff Intake:  9:00pm NPO After: 9:30pm   Lungs:  clear O2 Sat: 96% on room air. IV 0.9% NS with Angio Cath:  20g  IV Site: R Wrist  IV Started by:  Cathlyn Parsons, RN  Chest Size (in):  42 Cup Size: n/a  Height: 5\' 9"  (1.753 m)  Weight:  175 lb (79.379 kg)  BMI:  Body mass index is 25.84 kg/(m^2). Tech Comments:  This patient's BP dropped during the administration of Lexiscan. He was put into a trendelenburg position and given a 250 cc NS bolus. It increased his BP and the patient was asymptomatic. BP check prior to leaving was 92/64.    Nuclear Med Study 1 or 2 day study: 1 day  Stress Test Type:  Adenosine  Reading MD: Willa Rough, MD  Order Authorizing Provider:  Ferman Hamming and Solon Palm  Resting Radionuclide: Technetium 61m Tetrofosmin  Resting Radionuclide Dose: 10.9 mCi   Stress Radionuclide:  Technetium 35m Tetrofosmin  Stress Radionuclide Dose: 33.0 mCi           Stress Protocol Rest HR: 69 Stress HR: 82  Rest BP: 98/66 Stress BP: 90/63  Exercise Time (min): n/a METS: n/a   Predicted Max HR: 157 bpm % Max HR: 52.23 bpm Rate Pressure Product: 8036   Dose of Adenosine (mg):  44.5 Dose of Lexiscan: n/a mg  Dose of Atropine (mg): n/a Dose of Dobutamine: n/a mcg/kg/min (at max HR)  Stress  Test Technologist: Milana Na, EMT-P  Nuclear Technologist:  Domenic Polite, CNMT     Rest Procedure:  Myocardial perfusion imaging was performed at rest 45 minutes following the intravenous administration of Technetium 14m Tetrofosmin. Rest ECG: A/V Pacing  Stress Procedure:  The patient received IV adenosine at 140 mcg/kg/min for 4 minutes.  There were no changes 2nd to A/V Pacemaker.  Technetium 55m Tetrofosmin was injected at the 2 minute mark and quantitative spect images were obtained after a 45 minute delay. Stress ECG: No significant change from baseline ECG  QPS Raw Data Images:  Normal; no motion artifact; normal heart/lung ratio. Stress Images:  There is a medium-sized defect affecting all 3 segments of the anterior septum and all 3 segments of the inferior septum. The degree of photon reduction he is moderate. Rest Images:  There is a medium-sized defect affecting all 3 segments of the anterior septum and the mid and apical segment of the inferior septum. The degree of photon reduction is mild. Subtraction (SDS):  There is some peri-infarct ischemia in the septum. Transient Ischemic Dilatation (Normal <1.22):  1.10 Lung/Heart Ratio (Normal <0.45):  0.27  Quantitative Gated Spect Images QGS EDV:  102 ml QGS ESV:  60 ml  Impression Exercise Capacity:  Adenosine study with no exercise. BP Response:  Normal blood pressure response. Clinical Symptoms:  chest pressure ECG Impression:  No significant ST segment change suggestive of ischemia. Comparison with Prior Nuclear Study: No images to compare  Overall Impression:  The images reveal evidence of scar with peri-infarct ischemia affecting the entire septum. There is a corresponding wall motion abnormality. This is a moderate risk scan  LV Ejection Fraction: 42%.  LV Wall Motion:  There is severe hypokinesis of the septum.Tinnie Gens Katz,MD

## 2011-08-26 ENCOUNTER — Other Ambulatory Visit: Payer: Medicare Other

## 2011-08-26 ENCOUNTER — Encounter: Payer: Self-pay | Admitting: Internal Medicine

## 2011-08-26 ENCOUNTER — Ambulatory Visit (INDEPENDENT_AMBULATORY_CARE_PROVIDER_SITE_OTHER): Payer: Medicare Other | Admitting: Internal Medicine

## 2011-08-26 VITALS — BP 114/80 | HR 70 | Ht 68.0 in | Wt 181.4 lb

## 2011-08-26 DIAGNOSIS — H1010 Acute atopic conjunctivitis, unspecified eye: Secondary | ICD-10-CM

## 2011-08-26 DIAGNOSIS — H1045 Other chronic allergic conjunctivitis: Secondary | ICD-10-CM

## 2011-08-26 DIAGNOSIS — J309 Allergic rhinitis, unspecified: Secondary | ICD-10-CM

## 2011-08-26 DIAGNOSIS — J3089 Other allergic rhinitis: Secondary | ICD-10-CM

## 2011-08-26 NOTE — Progress Notes (Signed)
08/26/11- 67 yoM former smoker referred courtesy of Dr Shana Chute for allergy evaluation of perennial chronic rhinitis and conjunctivitis. Complicating medical history of myasthenia gravis, CAD/ischemic CM/VT/chronic CHF/AICD  Patient c/o runny nose, post nasal drip, and headaches.  Symptoms have been bothersome for years, coming and going for several days at a time. He doesn't recognize a definite seasonal pattern . Has tried antibiotics and nasal saline sprays. He denies asthma but has occasional dry cough with scratchy throat that he blames on post nasal drip. Not much history of sinus infection. No ENT surgery. He is a disabled Journalist, newspaper  Prior to Admission medications   Medication Sig Start Date End Date Taking? Authorizing Provider  aspirin EC 81 MG tablet Take 1 tablet (81 mg total) by mouth daily. 01/25/11  Yes Duke Salvia, MD  azaTHIOprine (IMURAN) 50 MG tablet Take 150 mg by mouth daily.    Yes Historical Provider, MD  furosemide (LASIX) 40 MG tablet Take 60 mg by mouth daily.   Yes Historical Provider, MD  magnesium oxide (MAG-OX 400) 400 MG tablet Take 400 mg by mouth 2 (two) times daily.  03/17/11  Yes Duke Salvia, MD  Nutritional Supplements (GRAPESEED EXTRACT PO) Take 2 capsules by mouth daily.   Yes Historical Provider, MD  omeprazole (PRILOSEC) 20 MG capsule Take 20 mg by mouth daily.   Yes Historical Provider, MD  OVER THE COUNTER MEDICATION Take 1-2 tablets by mouth daily. "blue green algae"   Yes Historical Provider, MD  OVER THE COUNTER MEDICATION Take 2 tablets by mouth 2 (two) times daily. Health food store digest enzymes tablets.   Yes Historical Provider, MD  potassium chloride SA (K-DUR,KLOR-CON) 20 MEQ tablet Take 20 mEq by mouth daily.    Yes Historical Provider, MD  predniSONE (DELTASONE) 5 MG tablet Take 5 mg by mouth every other day.     Yes Historical Provider, MD  ramipril (ALTACE) 5 MG capsule Take 5 mg by mouth daily.     Yes Historical Provider, MD    simvastatin (ZOCOR) 20 MG tablet Take 20 mg by mouth at bedtime.     Yes Historical Provider, MD  sotalol (BETAPACE) 120 MG tablet Take 120 mg by mouth 2 (two) times daily.    Yes Historical Provider, MD   Past Medical History  Diagnosis Date  . Chronic back pain     a. s/p multiple lumbar surgeries  . GERD (gastroesophageal reflux disease)   . Hyperlipidemia   . Myasthenia gravis   . Monomorphic ventricular tachycardia     a. s/p ICD 1997;   b. 06/2007 ICD upgrade to MDT Concerto Bi-V though LV lead unable to be placed.  . Complete heart block     a. h/o transient complete heart block in April 2009  . Sciatica   . DJD (degenerative joint disease)   . Ischemic cardiomyopathy     a. 05/2009 Echo: EF 40%, mild to mod glob HK, Gr 2 dd, mild LVH, Triv AI, Mild MR, PASP 37-69mmHg  . CAD (coronary artery disease)     a. s/p PCI/BMS of prox LAD in 1997;  b. 4/10 LHC: nonobs dzs. (pRCA 50-70%);  c.   Myoview 08/23/11 : Scar with peri-infarct ischemia infecting the entire septum with a corresponding wall motion abnormality, moderate risk scan, EF 42%.   Past Surgical History  Procedure Date  . Cardiac defibrillator placement 1997    Medtronic Concerto  . Coronary angioplasty with stent placement  bare metal stenting of the LAD in 1997  . Generator change 07/1999  . Generator change 11/23/2006  . Anterior lumbar interbody fusion 04/23/2007    L4-L5   . Gallbladder surgery    Family History  Problem Relation Age of Onset  . Coronary artery disease Brother    History   Social History  . Marital Status: Single    Spouse Name: N/A    Number of Children: N/A  . Years of Education: N/A   Occupational History  . Not on file.   Social History Main Topics  . Smoking status: Former Smoker    Quit date: 01/03/1982  . Smokeless tobacco: Never Used  . Alcohol Use: Yes     occasional  . Drug Use: No  . Sexually Active: Not on file   Other Topics Concern  . Not on file   Social  History Narrative  . No narrative on file   ROS-see HPI Constitutional:   No-   weight loss, night sweats, fevers, chills, fatigue, lassitude. HEENT:   No-  headaches, difficulty swallowing, tooth/dental problems, sore throat,       No-  sneezing, itching, ear ache, +nasal congestion, post nasal drip,  CV:  No-   chest pain, orthopnea, PND, swelling in lower extremities, anasarca,  dizziness, palpitations Resp:  No acute  shortness of breath with exertion or at rest.              No-   productive cough,  + little non-productive cough,  No- coughing up of blood.              No-   change in color of mucus.  No- wheezing.   Skin: No-   rash or lesions. GI:  No-   heartburn, indigestion, abdominal pain, nausea, vomiting, diarrhea,                 change in bowel habits, loss of appetite GU: No-   dysuria, change in color of urine, no urgency or frequency.  No- flank pain. MS:  No-   joint pain or swelling.  No- decreased range of motion.  No- back pain. Neuro-     nothing unusual Psych:  No- change in mood or affect. No depression or anxiety.  No memory loss.  OBJ- Physical Exam General- Alert, Oriented, Affect-appropriate, Distress- none acute, trim Skin- rash-none, lesions- none, excoriation- none Lymphadenopathy- none Head- atraumatic            Eyes- Gross vision intact, PERRLA, conjunctivae and secretions clear            Ears- Hearing, canals-normal            Nose- + clear mucus, no-Septal dev, polyps, erosion, perforation             Throat- Mallampati II , mucosa clear , drainage- none, tonsils- atrophic Neck- flexible , trachea midline, no stridor , thyroid nl, carotid no bruit Chest - symmetrical excursion , unlabored           Heart/CV- RRR , no murmur , no gallop  , no rub, nl s1 s2                           - JVD- none , edema- none, stasis changes- none, varices- none           Lung- clear to P&A, wheeze- none, cough- none , dullness-none, rub- none  Chest wall-    Abd- tender-no, distended-no, bowel sounds-present, HSM- no Br/ Gen/ Rectal- Not done, not indicated Extrem- cyanosis- none, clubbing, none, atrophy- none, strength- nl Neuro- grossly intact to observation

## 2011-08-26 NOTE — Patient Instructions (Addendum)
Order lab- Allergy Profile    Dx allergic rhinitis  Sample Dymista nasal spray   1-2 puffs each nostril once daily at bedtime    Suggest trying an otc antihistamine Loratadine/ Claritin -  Once daily as needed

## 2011-08-29 LAB — ALLERGY FULL PROFILE
Allergen,Goose feathers, e70: <0.1 kU/L
Bermuda Grass: 0.13 kU/L — ABNORMAL HIGH
Box Elder IgE: <0.1 kU/L
Candida Albicans: 0.27 kU/L — ABNORMAL HIGH
Fescue: <0.1 kU/L
G005 Rye, Perennial: <0.1 kU/L
IgE (Immunoglobulin E), Serum: 32 IU/mL (ref 0.0–180.0)
Oak: <0.1 kU/L
Plantain: <0.1 kU/L
Stemphylium Botryosum: <0.1 kU/L
Timothy Grass: <0.1 kU/L

## 2011-08-30 ENCOUNTER — Encounter: Payer: Self-pay | Admitting: Physician Assistant

## 2011-08-31 ENCOUNTER — Encounter: Payer: Self-pay | Admitting: Physician Assistant

## 2011-08-31 ENCOUNTER — Encounter: Payer: Self-pay | Admitting: *Deleted

## 2011-08-31 ENCOUNTER — Ambulatory Visit (INDEPENDENT_AMBULATORY_CARE_PROVIDER_SITE_OTHER): Payer: Medicare Other | Admitting: Physician Assistant

## 2011-08-31 VITALS — BP 106/70 | HR 76 | Ht 68.0 in | Wt 182.8 lb

## 2011-08-31 DIAGNOSIS — I255 Ischemic cardiomyopathy: Secondary | ICD-10-CM

## 2011-08-31 DIAGNOSIS — E785 Hyperlipidemia, unspecified: Secondary | ICD-10-CM

## 2011-08-31 DIAGNOSIS — I2589 Other forms of chronic ischemic heart disease: Secondary | ICD-10-CM

## 2011-08-31 DIAGNOSIS — R079 Chest pain, unspecified: Secondary | ICD-10-CM

## 2011-08-31 DIAGNOSIS — R9439 Abnormal result of other cardiovascular function study: Secondary | ICD-10-CM

## 2011-08-31 DIAGNOSIS — I2510 Atherosclerotic heart disease of native coronary artery without angina pectoris: Secondary | ICD-10-CM

## 2011-08-31 LAB — CBC WITH DIFFERENTIAL/PLATELET
Eosinophils Relative: 3.1 % (ref 0.0–5.0)
HCT: 41 % (ref 39.0–52.0)
Lymphocytes Relative: 25.4 % (ref 12.0–46.0)
Lymphs Abs: 1.7 10*3/uL (ref 0.7–4.0)
Monocytes Relative: 10.9 % (ref 3.0–12.0)
Platelets: 176 10*3/uL (ref 150.0–400.0)
WBC: 6.7 10*3/uL (ref 4.5–10.5)

## 2011-08-31 LAB — PROTIME-INR
INR: 1.1 ratio — ABNORMAL HIGH (ref 0.8–1.0)
Prothrombin Time: 12 s (ref 10.2–12.4)

## 2011-08-31 LAB — BASIC METABOLIC PANEL WITH GFR
BUN: 12 mg/dL (ref 6–23)
CO2: 27 meq/L (ref 19–32)
Calcium: 8.7 mg/dL (ref 8.4–10.5)
Chloride: 103 meq/L (ref 96–112)
Creatinine, Ser: 1 mg/dL (ref 0.4–1.5)
GFR: 94.79 mL/min
Glucose, Bld: 102 mg/dL — ABNORMAL HIGH (ref 70–99)
Potassium: 4 meq/L (ref 3.5–5.1)
Sodium: 137 meq/L (ref 135–145)

## 2011-08-31 NOTE — Patient Instructions (Addendum)
Your schedule for your Left Heart Cath on Tuesday, September 06, 2011 please refer to your letter for instructions.  Your physician recommends that you continue on your current medications as directed. Please refer to the Current Medication list given to you today.

## 2011-08-31 NOTE — H&P (Signed)
History and Physical  Date:  08/31/2011   Name:  [name]   DOB:  09/21/1950   MRN:  [id number]  PCP:  Pola Corn, MD  Primary Cardiologist/Primary Electrophysiologist:  Dr. Lewayne Bunting    History of Present Illness: [name] is a 61 y.o. male who returns for followup.  He has a history of CAD, status post BMS the proximal LAD in 1997, ischemic cardiomyopathy, EF 40%, ventricular tachycardia, status post CRT-D (LV lead unable to be placed), HL, myasthenia gravis. He was evaluated by Nicolasa Ducking, NP 8/14 for chest pain occurring in the context of emotional stress.  He had been seen in the emergency room with a negative troponin. Stress testing was arranged. Myoview 08/23/11 : Scar with peri-infarct ischemia infecting the entire septum with a corresponding wall motion abnormality, moderate risk scan, EF 42%.  He is doing well. He denies any recurrent chest discomfort since last being seen. He denies significant dyspnea. He denies orthopnea, PND or edema. He denies syncope. He denies any shocks from his defibrillator.  Wt Readings from Last 3 Encounters:  08/31/11 182 lb 12.8 oz (82.918 kg)  08/26/11 181 lb 6.4 oz (82.283 kg)  08/23/11 175 lb (79.379 kg)     Past Medical History  Diagnosis Date  . Chronic back pain     a. s/p multiple lumbar surgeries  . GERD (gastroesophageal reflux disease)   . Hyperlipidemia   . Myasthenia gravis   . Monomorphic ventricular tachycardia     a. s/p ICD 1997;   b. 06/2007 ICD upgrade to MDT Concerto Bi-V though LV lead unable to be placed.  . Complete heart block     a. h/o transient complete heart block in April 2009  . Sciatica   . DJD (degenerative joint disease)   . Ischemic cardiomyopathy     a. 05/2009 Echo: EF 40%, mild to mod glob HK, Gr 2 dd, mild LVH, Triv AI, Mild MR, PASP 37-1mmHg  . CAD (coronary artery disease)     a. s/p PCI/BMS of prox LAD in 1997;  b. 4/10 LHC: nonobs dzs. (pRCA 50-70%);  c.   Myoview  08/23/11 : Scar with peri-infarct ischemia infecting the entire septum with a corresponding wall motion abnormality, moderate risk scan, EF 42%.    Past Surgical History  Procedure Date  . Cardiac defibrillator placement 1997    Medtronic Concerto  . Coronary angioplasty with stent placement     bare metal stenting of the LAD in 1997  . Generator change 07/1999  . Generator change 11/23/2006  . Anterior lumbar interbody fusion 04/23/2007    L4-L5   . Gallbladder surgery      Current Outpatient Prescriptions  Medication Sig Dispense Refill  . aspirin EC 81 MG tablet Take 1 tablet (81 mg total) by mouth daily.      Marland Kitchen azaTHIOprine (IMURAN) 50 MG tablet Take 150 mg by mouth daily.       . furosemide (LASIX) 40 MG tablet Take 60 mg by mouth daily.      . magnesium oxide (MAG-OX 400) 400 MG tablet Take 400 mg by mouth 2 (two) times daily.       . Nutritional Supplements (GRAPESEED EXTRACT PO) Take 2 capsules by mouth daily.      Marland Kitchen omeprazole (PRILOSEC) 20 MG capsule Take 20 mg by mouth daily.      Marland Kitchen OVER THE COUNTER MEDICATION Take 1-2 tablets by mouth daily. "blue green algae"      .  OVER THE COUNTER MEDICATION Take 2 tablets by mouth 2 (two) times daily. Health food store digest enzymes tablets.      . potassium chloride SA (K-DUR,KLOR-CON) 20 MEQ tablet Take 20 mEq by mouth daily.       . predniSONE (DELTASONE) 5 MG tablet Take 5 mg by mouth every other day.        . ramipril (ALTACE) 5 MG capsule Take 5 mg by mouth daily.        . simvastatin (ZOCOR) 20 MG tablet Take 20 mg by mouth at bedtime.        . sotalol (BETAPACE) 120 MG tablet Take 120 mg by mouth 2 (two) times daily.         Allergies: No Known Allergies  History  Substance Use Topics  . Smoking status: Former Smoker    Quit date: 01/03/1982  . Smokeless tobacco: Never Used  . Alcohol Use: Yes     occasional     Family History  Problem Relation Age of Onset  . Coronary artery disease Brother      ROS:  Please  see the history of present illness. He reports an occasional headache.   All other systems reviewed and negative.   PHYSICAL EXAM: VS:  BP 106/70  Pulse 76  Ht 5\' 8"  (1.727 m)  Wt 182 lb 12.8 oz (82.918 kg)  BMI 27.79 kg/m2 Well nourished, well developed, in no acute distress HEENT: normal Neck: no JVD Cardiac:  normal S1, S2; RRR; no murmur Lungs:  clear to auscultation bilaterally, no wheezing, rhonchi or rales Abd: soft, nontender, no hepatomegaly Ext: no edema Skin: warm and dry Neuro:  CNs 2-12 intact, no focal abnormalities noted  EKG:  Sinus rhythm, ventricular paced, heart rate 76      ASSESSMENT AND PLAN:  1. Coronary Artery Disease: He has had recent episodes of chest discomfort in the setting of emotional stress. He has a history of coronary artery disease with prior PCI. His last cardiac catheterization was 3 years ago. He now has a moderate risk stress Myoview. I discussed his case today with Dr. Clifton James.  We have recommended cardiac catheterization.  Risks and benefits of cardiac catheterization have been discussed with the patient.  These include bleeding, infection, kidney damage, stroke, heart attack, death.  The patient understands these risks and is willing to proceed.   2. Ventricular Tachycardia, Status Post AICD: He denies any ICD shocks. Continue sotalol.  3. Hypertension: Controlled.  4. Hyperlipidemia: He remains on statin therapy. Goal LDL less than or equal to 70. This is managed by his PCP.  5. Ischemic Cardiomyopathy: EF remains stable. Volume remained stable. Continue current therapy.  Signed, Tereso Newcomer, PA-C  10:39 AM 08/31/2011

## 2011-08-31 NOTE — H&P (Signed)
I discussed this pt with Tereso Newcomer, PA-C today. cdm

## 2011-08-31 NOTE — Progress Notes (Signed)
[address] [place], [place]  [address] Phone: [phone] Fax:  [phone]  Date:  08/31/2011   Name:  [name]   DOB:  1950/03/04   MRN:  [id number]  PCP:  Pola Corn, MD  Primary Cardiologist/Primary Electrophysiologist:  Dr. Lewayne Bunting    History of Present Illness: [name] is a 61 y.o. male who returns for followup.  He has a history of CAD, status post BMS the proximal LAD in 1997, ischemic cardiomyopathy, EF 40%, ventricular tachycardia, status post CRT-D (LV lead unable to be placed), HL, myasthenia gravis. He was evaluated by Nicolasa Ducking, NP 8/14 for chest pain occurring in the context of emotional stress.  He had been seen in the emergency room with a negative troponin. Stress testing was arranged. Myoview 08/23/11 : Scar with peri-infarct ischemia infecting the entire septum with a corresponding wall motion abnormality, moderate risk scan, EF 42%.  He is doing well. He denies any recurrent chest discomfort since last being seen. He denies significant dyspnea. He denies orthopnea, PND or edema. He denies syncope. He denies any shocks from his defibrillator.  Wt Readings from Last 3 Encounters:  08/31/11 182 lb 12.8 oz (82.918 kg)  08/26/11 181 lb 6.4 oz (82.283 kg)  08/23/11 175 lb (79.379 kg)     Past Medical History  Diagnosis Date  . Chronic back pain     a. s/p multiple lumbar surgeries  . GERD (gastroesophageal reflux disease)   . Hyperlipidemia   . Myasthenia gravis   . Monomorphic ventricular tachycardia     a. s/p ICD 1997;   b. 06/2007 ICD upgrade to MDT Concerto Bi-V though LV lead unable to be placed.  . Complete heart block     a. h/o transient complete heart block in April 2009  . Sciatica   . DJD (degenerative joint disease)   . Ischemic cardiomyopathy     a. 05/2009 Echo: EF 40%, mild to mod glob HK, Gr 2 dd, mild LVH, Triv AI, Mild MR, PASP 37-74mmHg  . CAD (coronary artery disease)     a.  s/p PCI/BMS of prox LAD in 1997;  b. 4/10 LHC: nonobs dzs. (pRCA 50-70%);  c.   Myoview 08/23/11 : Scar with peri-infarct ischemia infecting the entire septum with a corresponding wall motion abnormality, moderate risk scan, EF 42%.    Past Surgical History  Procedure Date  . Cardiac defibrillator placement 1997    Medtronic Concerto  . Coronary angioplasty with stent placement     bare metal stenting of the LAD in 1997  . Generator change 07/1999  . Generator change 11/23/2006  . Anterior lumbar interbody fusion 04/23/2007    L4-L5   . Gallbladder surgery      Current Outpatient Prescriptions  Medication Sig Dispense Refill  . aspirin EC 81 MG tablet Take 1 tablet (81 mg total) by mouth daily.      Marland Kitchen azaTHIOprine (IMURAN) 50 MG tablet Take 150 mg by mouth daily.       . furosemide (LASIX) 40 MG tablet Take 60 mg by mouth daily.      . magnesium oxide (MAG-OX 400) 400 MG tablet Take 400 mg by mouth 2 (two) times daily.       . Nutritional Supplements (GRAPESEED EXTRACT PO) Take 2 capsules by mouth daily.      Marland Kitchen omeprazole (PRILOSEC) 20 MG capsule Take 20 mg by mouth daily.      Marland Kitchen OVER THE  COUNTER MEDICATION Take 1-2 tablets by mouth daily. "blue green algae"      . OVER THE COUNTER MEDICATION Take 2 tablets by mouth 2 (two) times daily. Health food store digest enzymes tablets.      . potassium chloride SA (K-DUR,KLOR-CON) 20 MEQ tablet Take 20 mEq by mouth daily.       . predniSONE (DELTASONE) 5 MG tablet Take 5 mg by mouth every other day.        . ramipril (ALTACE) 5 MG capsule Take 5 mg by mouth daily.        . simvastatin (ZOCOR) 20 MG tablet Take 20 mg by mouth at bedtime.        . sotalol (BETAPACE) 120 MG tablet Take 120 mg by mouth 2 (two) times daily.         Allergies: No Known Allergies  History  Substance Use Topics  . Smoking status: Former Smoker    Quit date: 01/03/1982  . Smokeless tobacco: Never Used  . Alcohol Use: Yes     occasional     Family History    Problem Relation Age of Onset  . Coronary artery disease Brother      ROS:  Please see the history of present illness. He reports an occasional headache.   All other systems reviewed and negative.   PHYSICAL EXAM: VS:  BP 106/70  Pulse 76  Ht 5\' 8"  (1.727 m)  Wt 182 lb 12.8 oz (82.918 kg)  BMI 27.79 kg/m2 Well nourished, well developed, in no acute distress HEENT: normal Neck: no JVD Cardiac:  normal S1, S2; RRR; no murmur Lungs:  clear to auscultation bilaterally, no wheezing, rhonchi or rales Abd: soft, nontender, no hepatomegaly Ext: no edema Skin: warm and dry Neuro:  CNs 2-12 intact, no focal abnormalities noted  EKG:  Sinus rhythm, ventricular paced, heart rate 76      ASSESSMENT AND PLAN:  1. Coronary Artery Disease: He has had recent episodes of chest discomfort in the setting of emotional stress. He has a history of coronary artery disease with prior PCI. His last cardiac catheterization was 3 years ago. He now has a moderate risk stress Myoview. I discussed his case today with Dr. Clifton James.  We have recommended cardiac catheterization.  Risks and benefits of cardiac catheterization have been discussed with the patient.  These include bleeding, infection, kidney damage, stroke, heart attack, death.  The patient understands these risks and is willing to proceed.   2. Ventricular Tachycardia, Status Post AICD: He denies any ICD shocks. Continue sotalol.  3. Hypertension: Controlled.  4. Hyperlipidemia: He remains on statin therapy. Goal LDL less than or equal to 70. This is managed by his PCP.  5. Ischemic Cardiomyopathy: EF remains stable. Volume remained stable. Continue current therapy.  Signed, Tereso Newcomer, PA-C  10:39 AM 08/31/2011

## 2011-09-01 ENCOUNTER — Telehealth: Payer: Self-pay | Admitting: *Deleted

## 2011-09-01 NOTE — Telephone Encounter (Signed)
pt notified about pre cath labs ok, pt gave verbal understanding

## 2011-09-01 NOTE — Telephone Encounter (Signed)
Message copied by Tarri Fuller on Thu Sep 01, 2011 11:43 AM ------      Message from: [place], [place] T      Created: Wed Aug 31, 2011 10:11 PM       Labs ok for cath      Tereso Newcomer, PA-C  10:11 PM 08/31/2011

## 2011-09-02 NOTE — Progress Notes (Signed)
Quick Note:  Pt aware of results. ______ 

## 2011-09-04 DIAGNOSIS — J302 Other seasonal allergic rhinitis: Secondary | ICD-10-CM | POA: Insufficient documentation

## 2011-09-04 DIAGNOSIS — H1010 Acute atopic conjunctivitis, unspecified eye: Secondary | ICD-10-CM | POA: Insufficient documentation

## 2011-09-04 NOTE — Assessment & Plan Note (Signed)
He describes occasional itching and lacrimation but exam is unremarkable today. Plan-allergy profile, loratadine

## 2011-09-04 NOTE — Assessment & Plan Note (Signed)
Symptoms are not clearly attributable to an allergic trigger. Plan-lab for allergy profile, sample Dymista, try loratadine

## 2011-09-06 ENCOUNTER — Inpatient Hospital Stay (HOSPITAL_BASED_OUTPATIENT_CLINIC_OR_DEPARTMENT_OTHER)
Admission: RE | Admit: 2011-09-06 | Discharge: 2011-09-06 | Disposition: A | Discharge status: Home / Self Care | Payer: Medicare Other | Source: Ambulatory Visit | Attending: Cardiology | Admitting: Cardiology

## 2011-09-06 ENCOUNTER — Encounter (HOSPITAL_BASED_OUTPATIENT_CLINIC_OR_DEPARTMENT_OTHER)
Admission: RE | Disposition: A | Discharge status: Home / Self Care | Payer: Self-pay | Source: Ambulatory Visit | Attending: Cardiology

## 2011-09-06 DIAGNOSIS — I2510 Atherosclerotic heart disease of native coronary artery without angina pectoris: Secondary | ICD-10-CM | POA: Insufficient documentation

## 2011-09-06 DIAGNOSIS — R079 Chest pain, unspecified: Secondary | ICD-10-CM

## 2011-09-06 DIAGNOSIS — R9439 Abnormal result of other cardiovascular function study: Secondary | ICD-10-CM

## 2011-09-06 DIAGNOSIS — Z9861 Coronary angioplasty status: Secondary | ICD-10-CM | POA: Insufficient documentation

## 2011-09-06 SURGERY — JV LEFT HEART CATHETERIZATION WITH CORONARY ANGIOGRAM
Anesthesia: Moderate Sedation

## 2011-09-06 MED ORDER — ASPIRIN 81 MG PO CHEW
324.0000 mg | CHEWABLE_TABLET | ORAL | Status: AC
  Administered 2011-09-06: 324 mg via ORAL

## 2011-09-06 MED ORDER — ONDANSETRON HCL 4 MG/2ML IJ SOLN: 4.0000 mg | Freq: Four times a day (QID) | INTRAMUSCULAR | Status: DC | PRN

## 2011-09-06 MED ORDER — SODIUM CHLORIDE 0.9 % IJ SOLN: 3.0000 mL | Freq: Two times a day (BID) | INTRAMUSCULAR | Status: DC

## 2011-09-06 MED ORDER — SODIUM CHLORIDE 0.9 % IV SOLN: 250.0000 mL | INTRAVENOUS | Status: DC | PRN

## 2011-09-06 MED ORDER — SODIUM CHLORIDE 0.9 % IV SOLN: 1.0000 mL/kg/h | INTRAVENOUS | Status: DC

## 2011-09-06 MED ORDER — ACETAMINOPHEN 325 MG PO TABS: 650.0000 mg | ORAL_TABLET | ORAL | Status: DC | PRN

## 2011-09-06 MED ORDER — SODIUM CHLORIDE 0.9 % IV SOLN
INTRAVENOUS | Status: DC
  Administered 2011-09-06: 08:00:00 via INTRAVENOUS

## 2011-09-06 MED ORDER — SODIUM CHLORIDE 0.9 % IJ SOLN: 3.0000 mL | INTRAMUSCULAR | Status: DC | PRN

## 2011-09-06 NOTE — Interval H&P Note (Signed)
History and Physical Interval Note:  09/06/2011 8:37 AM  [name]  has presented today for surgery, with the diagnosis of abn ekg  The various methods of treatment have been discussed with the patient and family. After consideration of risks, benefits and other options for treatment, the patient has consented to  Procedure(s) (LRB): JV LEFT HEART CATHETERIZATION WITH CORONARY ANGIOGRAM (N/A) as a surgical intervention .  The patient's history has been reviewed, patient examined, no change in status, stable for surgery.  I have reviewed the patient's chart and labs.  Questions were answered to the patient's satisfaction.     Theron Arista [facility] 09/06/2011 8:37 AM

## 2011-09-06 NOTE — OR Nursing (Signed)
Dr Jordan at bedside to discuss results and treatment plan with pt and family 

## 2011-09-06 NOTE — OR Nursing (Signed)
Meal served 

## 2011-09-06 NOTE — OR Nursing (Signed)
Tegaderm dressing applied, site level 0, bedrest begins at 0915 

## 2011-09-06 NOTE — OR Nursing (Signed)
Discharge instructions reviewed and signed, pt stated understanding, ambulated in hall without difficulty, site level 0, transported to friend's car via wheelchair 

## 2011-09-06 NOTE — CV Procedure (Signed)
   Cardiac Catheterization Procedure Note  Name: [name] MRN: [id number] DOB: 1950/07/17  Procedure: Left Heart Cath, Selective Coronary Angiography, LV angiography  Indication: 61 year old black male with history of coronary disease status post remote bare-metal stent of the LAD. He had some recent chest pain and stress Myoview study showed evidence of anteroseptal scar with some peri-infarct ischemia.   Procedural details: The right groin was prepped, draped, and anesthetized with 1% lidocaine. Using modified Seldinger technique, a 4 French sheath was introduced into the right femoral artery. Standard Judkins catheters were used for coronary angiography and left ventriculography. Catheter exchanges were performed over a guidewire. There were no immediate procedural complications. The patient was transferred to the post catheterization recovery area for further monitoring.  Procedural Findings: Hemodynamics:  AO 112/66 with a mean of 87 mmHg LV 111/22 mmHg   Coronary angiography: Coronary dominance: right  Left mainstem: Normal.  Left anterior descending (LAD): The LAD has irregularities in the proximal vessel up to 20-30%. The stent in the mid vessel is widely patent.  Left circumflex (LCx): The left circumflex has minor irregularities less than 10%.  Right coronary artery (RCA): The right coronary is a dominant vessel. There is a 50% stenosis in the very proximal vessel. There is 40% narrowing in the mid vessel. These lesions are focal.  Left ventriculography: Left ventricular systolic function is abnormal, LVEF is estimated at 45 %, there is global hypokinesis worse in the inferior wall. There is no significant mitral insufficiency.  Final Conclusions:   1. Nonobstructive coronary disease. 2. Moderate left ventricular dysfunction.  Recommendations: Continue medical management.  Theron Arista [facility] 09/06/2011, 9:06 AM

## 2011-09-09 NOTE — Addendum Note (Signed)
Addended by: Tarri Fuller on: 09/09/2011 01:43 PM   Modules accepted: Orders

## 2011-09-20 ENCOUNTER — Encounter: Payer: Medicare Other | Admitting: Nurse Practitioner

## 2011-09-26 ENCOUNTER — Ambulatory Visit (INDEPENDENT_AMBULATORY_CARE_PROVIDER_SITE_OTHER): Payer: Medicare Other | Admitting: Internal Medicine

## 2011-09-26 ENCOUNTER — Encounter: Payer: Self-pay | Admitting: Internal Medicine

## 2011-09-26 VITALS — BP 138/90 | HR 78 | Ht 68.0 in | Wt 182.4 lb

## 2011-09-26 DIAGNOSIS — J302 Other seasonal allergic rhinitis: Secondary | ICD-10-CM

## 2011-09-26 DIAGNOSIS — J309 Allergic rhinitis, unspecified: Secondary | ICD-10-CM

## 2011-09-26 DIAGNOSIS — Z23 Encounter for immunization: Secondary | ICD-10-CM

## 2011-09-26 NOTE — Progress Notes (Signed)
08/26/11- 59 yoM former smoker referred courtesy of Dr Shana Chute for allergy evaluation of perennial chronic rhinitis and conjunctivitis. Complicating medical history of myasthenia gravis, CAD/ischemic CM/VT/chronic CHF/AICD  Patient c/o runny nose, post nasal drip, and headaches.  Symptoms have been bothersome for years, coming and going for several days at a time. He doesn't recognize a definite seasonal pattern . Has tried antibiotics and nasal saline sprays. He denies asthma but has occasional dry cough with scratchy throat that he blames on post nasal drip. Not much history of sinus infection. No ENT surgery. He is a disabled Journalist, newspaper  09/26/11- 24 yoM former smoker referred courtesy of Dr Shana Chute for allergy evaluation of perennial chronic rhinitis and conjunctivitis. Complicating medical history of myasthenia gravis, CAD/ischemic CM/VT/chronic CHF/AICD Review labs in detail;. couldn't tell any difference with using Dymista or antihistamine. Still some postnasal drip and complains of rattle in throat if he laughs. Little cough. Denies choking with meals. Allergy Profile 08/26/2011-total IgE 32. Elevations for dust mite, [place] grass.  ROS-see HPI Constitutional:   No-   weight loss, night sweats, fevers, chills, fatigue, lassitude. HEENT:   No-  headaches, difficulty swallowing, tooth/dental problems, sore throat,       No-  sneezing, itching, ear ache, +nasal congestion, post nasal drip,  CV:  No-   chest pain, orthopnea, PND, swelling in lower extremities, anasarca,  dizziness, palpitations Resp:  No acute  shortness of breath with exertion or at rest.              No-   productive cough,  + little non-productive cough,  No- coughing up of blood.              No-   change in color of mucus.  No- wheezing.   Skin: No-   rash or lesions. GI:  No-   heartburn, indigestion, abdominal pain, nausea, vomiting,  GU:  MS:  No-   joint pain or swelling.   Neuro-     nothing unusual Psych:   No- change in mood or affect. No depression or anxiety.  No memory loss.  OBJ- Physical Exam General- Alert, Oriented, Affect-appropriate, Distress- none acute, trim Skin- rash-none, lesions- none, excoriation- none Lymphadenopathy- none Head- atraumatic            Eyes- Gross vision intact, PERRLA, conjunctivae and secretions clear            Ears- Hearing, canals-normal            Nose- clear, no-Septal dev, polyps, erosion, perforation             Throat- Mallampati II-III , +mucosa red posterior pharynx , drainage- none, tonsils- atrophic Neck- flexible , trachea midline, no stridor , thyroid nl, carotid no bruit Chest - symmetrical excursion , unlabored           Heart/CV- RRR , no murmur , no gallop  , no rub, nl s1 s2                           - JVD- none , edema- none, stasis changes- none, varices- none           Lung- clear to P&A, wheeze- none, cough- none , dullness-none, rub- none           Chest wall-  Abd-  Br/ Gen/ Rectal- Not done, not indicated Extrem- cyanosis- none, clubbing, none, atrophy- none, strength- nl Neuro- grossly intact to observation

## 2011-09-26 NOTE — Patient Instructions (Addendum)
We wonder if reflux of stomach juice is irritating your throat> 1) Take omeprazole twice every day- before breakfast and supper 2) Avoid eating or drinking- especially large amounts- for an hour or more before you lie down    We wonder if the ramipril may be causing cough> 1) Stop ramipril for 3-4 weeks.  Watch over that time to see if you notice a real improvement in the cough and sense that you have something in your throat.    Pneumovax Flu vax  You can take loratadine or another antihistamine at any time if you think you need it for postnasal drip

## 2011-10-04 NOTE — Assessment & Plan Note (Signed)
This is not impressive IgE profile for atopic problems. Plan-he complains of postnasal drip but wants to see how he does off of his ACE inhibitor and with more aggressive gastric acid control.

## 2011-11-01 ENCOUNTER — Telehealth: Payer: Self-pay | Admitting: Internal Medicine

## 2011-11-01 NOTE — Telephone Encounter (Signed)
The patient called stating that his physician, Dr. Bess Harvest, with [facility], has not received his labs for the last 2 months. I was unable to get the labs to attach properly; therefore, I printed and faxed them to [phone]....djc  Faxed labs from 8.28.2013 and 3.11.2013

## 2011-11-08 ENCOUNTER — Ambulatory Visit: Payer: Medicare Other | Admitting: Internal Medicine

## 2011-11-21 ENCOUNTER — Encounter: Payer: Self-pay | Admitting: Internal Medicine

## 2011-11-21 ENCOUNTER — Ambulatory Visit (INDEPENDENT_AMBULATORY_CARE_PROVIDER_SITE_OTHER): Payer: Medicare Other | Admitting: *Deleted

## 2011-11-21 ENCOUNTER — Other Ambulatory Visit: Payer: Self-pay

## 2011-11-21 DIAGNOSIS — I2589 Other forms of chronic ischemic heart disease: Secondary | ICD-10-CM

## 2011-11-21 DIAGNOSIS — I442 Atrioventricular block, complete: Secondary | ICD-10-CM

## 2011-11-21 DIAGNOSIS — I5042 Chronic combined systolic (congestive) and diastolic (congestive) heart failure: Secondary | ICD-10-CM

## 2011-11-21 DIAGNOSIS — I472 Ventricular tachycardia: Secondary | ICD-10-CM

## 2011-11-21 LAB — ICD DEVICE OBSERVATION
AL AMPLITUDE: 2.0784 mv
AL THRESHOLD: 0.5 V
BAMS-0001: 170 {beats}/min
BATTERY VOLTAGE: 2.89 V
FVT: 0
PACEART VT: 0
RV LEAD IMPEDENCE ICD: 364 Ohm
RV LEAD THRESHOLD: 1 V
TZAT-0001FASTVT: 1
TZAT-0002ATACH: NEGATIVE
TZAT-0002ATACH: NEGATIVE
TZAT-0004FASTVT: 8
TZAT-0005FASTVT: 88 pct
TZAT-0011FASTVT: 10 ms
TZAT-0012FASTVT: 200 ms
TZAT-0012SLOWVT: 200 ms
TZAT-0013FASTVT: 1
TZAT-0018ATACH: NEGATIVE
TZAT-0019ATACH: 6 V
TZAT-0019ATACH: 6 V
TZAT-0019SLOWVT: 8 V
TZAT-0020ATACH: 1.5 ms
TZAT-0020ATACH: 1.5 ms
TZAT-0020SLOWVT: 1.5 ms
TZON-0003SLOWVT: 400 ms
TZST-0001ATACH: 5
TZST-0001FASTVT: 2
TZST-0001FASTVT: 3
TZST-0001FASTVT: 5
TZST-0001SLOWVT: 5
TZST-0001SLOWVT: 6
TZST-0002ATACH: NEGATIVE
TZST-0002SLOWVT: NEGATIVE
TZST-0002SLOWVT: NEGATIVE
TZST-0003FASTVT: 35 J
TZST-0003FASTVT: 35 J
VF: 0

## 2011-11-21 NOTE — Patient Instructions (Addendum)
Return office visit with Dr Graciela Husbands 02/14/12@9 :15am

## 2011-11-21 NOTE — Progress Notes (Signed)
ICD check with ICM 

## 2011-12-27 ENCOUNTER — Ambulatory Visit: Payer: Medicare Other | Admitting: Internal Medicine

## 2011-12-30 ENCOUNTER — Encounter: Payer: Self-pay | Admitting: Internal Medicine

## 2011-12-30 ENCOUNTER — Ambulatory Visit (INDEPENDENT_AMBULATORY_CARE_PROVIDER_SITE_OTHER): Payer: Medicare Other | Admitting: Internal Medicine

## 2011-12-30 VITALS — BP 110/70 | HR 78 | Ht 68.0 in | Wt 187.6 lb

## 2011-12-30 DIAGNOSIS — J309 Allergic rhinitis, unspecified: Secondary | ICD-10-CM

## 2011-12-30 DIAGNOSIS — J302 Other seasonal allergic rhinitis: Secondary | ICD-10-CM

## 2011-12-30 MED ORDER — IPRATROPIUM BROMIDE 0.06 % NA SOLN: 2.0000 | Freq: Four times a day (QID) | NASAL | Status: DC

## 2011-12-30 NOTE — Progress Notes (Signed)
08/26/11- 15 yoM former smoker referred courtesy of Dr Shana Chute for allergy evaluation of perennial chronic rhinitis and conjunctivitis. Complicating medical history of myasthenia gravis, CAD/ischemic CM/VT/chronic CHF/AICD  Patient c/o runny nose, post nasal drip, and headaches.  Symptoms have been bothersome for years, coming and going for several days at a time. He doesn't recognize a definite seasonal pattern . Has tried antibiotics and nasal saline sprays. He denies asthma but has occasional dry cough with scratchy throat that he blames on post nasal drip. Not much history of sinus infection. No ENT surgery. He is a disabled Journalist, newspaper  09/26/11- 60 yoM former smoker referred courtesy of Dr Shana Chute for allergy evaluation of perennial chronic rhinitis and conjunctivitis. Complicating medical history of myasthenia gravis, CAD/ischemic CM/VT/chronic CHF/AICD Review labs in detail;. couldn't tell any difference with using Dymista or antihistamine. Still some postnasal drip and complains of rattle in throat if he laughs. Little cough. Denies choking with meals. Allergy Profile 08/26/2011-total IgE 32. Elevations for dust mite, [place] grass.  12/30/11- 1 yoM former smoker referred courtesy of Dr Shana Chute for allergy evaluation of perennial chronic rhinitis and conjunctivitis. Complicating medical history of myasthenia gravis, CAD/ischemic CM/VT/chronic CHF/AICD Allergies are unchanged -- still has a runny nose with clear to yellow drainage.  PND and cough have improved.  Using OTC antihistamine but has to double up the dose. Still postnasal drainage but cough is better. Being off ACE inhibitor/rramipril and being on omeprazole have made no obvious difference. We are shy about using anticholinergics because of his history of myasthenia gravis, but epocrates makes no mention of concern with ipratropium. After discussion, he is going to try it.. Failed Dymista nasal spray.   ROS-see  HPI Constitutional:   No-   weight loss, night sweats, fevers, chills, fatigue, lassitude. HEENT:   No-  headaches, difficulty swallowing, tooth/dental problems, sore throat,       No-  sneezing, itching, ear ache, +nasal congestion, +post nasal drip,  CV:  No-   chest pain, orthopnea, PND, swelling in lower extremities, anasarca,  dizziness, palpitations Resp:  No acute  shortness of breath with exertion or at rest.              No-   productive cough,  + little non-productive cough,  No- coughing up of blood.              No-   change in color of mucus.  No- wheezing.   Skin: No-   rash or lesions. GI:  No-   heartburn, indigestion, abdominal pain, nausea, vomiting,  GU:  MS:  No-   joint pain or swelling.   Neuro-     nothing unusual Psych:  No- change in mood or affect. No depression or anxiety.  No memory loss.  OBJ- Physical Exam General- Alert, Oriented, Affect-appropriate, Distress- none acute, trim Skin- rash-none, lesions- none, excoriation- none Lymphadenopathy- none Head- atraumatic            Eyes- Gross vision intact, PERRLA, conjunctivae and secretions clear            Ears- Hearing, canals-normal            Nose- clear, no-Septal dev, polyps, erosion, perforation             Throat- Mallampati II-III , +mucosa red posterior pharynx , drainage- none, tonsils- atrophic Neck- flexible , trachea midline, no stridor , thyroid nl, carotid no bruit Chest - symmetrical excursion , unlabored  Heart/CV- RRR , no murmur , no gallop  , no rub, nl s1 s2                           - JVD- none , edema- none, stasis changes- none, varices- none           Lung- clear to P&A, wheeze- none, cough- none , dullness-none, rub- none           Chest wall-  Abd-  Br/ Gen/ Rectal- Not done, not indicated Extrem- cyanosis- none, clubbing, none, atrophy- none, strength- nl Neuro- grossly intact to observation

## 2011-12-30 NOTE — Patient Instructions (Addendum)
Script sent for ipratropium nasal spray- use up to 4 times daily when needed for watery nose and postnasal drip. See if it helps the cough.

## 2012-01-13 NOTE — Assessment & Plan Note (Signed)
There are probably allergic and nonallergic triggers. We discussed anticholinergic therapy such as ipratropium nasal spray. Since he has res.ponded poorly to antihistamines and nasal steroids, we are going to try ipratropium

## 2012-02-14 ENCOUNTER — Encounter: Payer: Self-pay | Admitting: *Deleted

## 2012-02-14 ENCOUNTER — Ambulatory Visit (INDEPENDENT_AMBULATORY_CARE_PROVIDER_SITE_OTHER): Payer: Medicare Other | Admitting: Internal Medicine

## 2012-02-14 ENCOUNTER — Encounter: Payer: Self-pay | Admitting: Internal Medicine

## 2012-02-14 VITALS — BP 120/80 | Ht 68.0 in | Wt 180.6 lb

## 2012-02-14 DIAGNOSIS — I472 Ventricular tachycardia, unspecified: Secondary | ICD-10-CM

## 2012-02-14 DIAGNOSIS — I5042 Chronic combined systolic (congestive) and diastolic (congestive) heart failure: Secondary | ICD-10-CM

## 2012-02-14 DIAGNOSIS — I442 Atrioventricular block, complete: Secondary | ICD-10-CM

## 2012-02-14 DIAGNOSIS — I2589 Other forms of chronic ischemic heart disease: Secondary | ICD-10-CM

## 2012-02-14 DIAGNOSIS — Z95810 Presence of automatic (implantable) cardiac defibrillator: Secondary | ICD-10-CM

## 2012-02-14 LAB — CBC WITH DIFFERENTIAL/PLATELET
Basophils Relative: 0.4 % (ref 0.0–3.0)
Eosinophils Absolute: 0.1 10*3/uL (ref 0.0–0.7)
Eosinophils Relative: 1.5 % (ref 0.0–5.0)
HCT: 40.9 % (ref 39.0–52.0)
Lymphs Abs: 2 10*3/uL (ref 0.7–4.0)
MCHC: 34.1 g/dL (ref 30.0–36.0)
MCV: 92.1 fl (ref 78.0–100.0)
Monocytes Absolute: 0.9 10*3/uL (ref 0.1–1.0)
Platelets: 283 10*3/uL (ref 150.0–400.0)
RBC: 4.44 Mil/uL (ref 4.22–5.81)
WBC: 9.8 10*3/uL (ref 4.5–10.5)

## 2012-02-14 LAB — ICD DEVICE OBSERVATION
AL IMPEDENCE ICD: 400 Ohm
ATRIAL PACING ICD: 2.71 pct
BATTERY VOLTAGE: 2.81 V
CHARGE TIME: 11.22 s
LV LEAD IMPEDENCE ICD: 16382 Ohm
TOT-0001: 1
TOT-0002: 0
TOT-0006: 20090619000000
TZAT-0001ATACH: 1
TZAT-0001ATACH: 2
TZAT-0001ATACH: 3
TZAT-0001FASTVT: 1
TZAT-0001SLOWVT: 1
TZAT-0002ATACH: NEGATIVE
TZAT-0002SLOWVT: NEGATIVE
TZAT-0012ATACH: 150 ms
TZAT-0012ATACH: 150 ms
TZAT-0018ATACH: NEGATIVE
TZAT-0018ATACH: NEGATIVE
TZAT-0018FASTVT: NEGATIVE
TZAT-0018SLOWVT: NEGATIVE
TZAT-0019ATACH: 6 V
TZAT-0019FASTVT: 8 V
TZAT-0020ATACH: 1.5 ms
TZAT-0020FASTVT: 1.5 ms
TZON-0003ATACH: 350 ms
TZON-0003VSLOWVT: 380 ms
TZON-0004SLOWVT: 16
TZON-0004VSLOWVT: 20
TZON-0005SLOWVT: 12
TZST-0001ATACH: 4
TZST-0001ATACH: 6
TZST-0001FASTVT: 4
TZST-0001FASTVT: 6
TZST-0001SLOWVT: 2
TZST-0001SLOWVT: 4
TZST-0001SLOWVT: 6
TZST-0002ATACH: NEGATIVE
TZST-0002SLOWVT: NEGATIVE
TZST-0002SLOWVT: NEGATIVE
TZST-0002SLOWVT: NEGATIVE
TZST-0003FASTVT: 35 J
TZST-0003FASTVT: 35 J
TZST-0003FASTVT: 35 J
VENTRICULAR PACING ICD: 99.99 pct

## 2012-02-14 LAB — BASIC METABOLIC PANEL
BUN: 14 mg/dL (ref 6–23)
CO2: 28 mEq/L (ref 19–32)
Chloride: 101 mEq/L (ref 96–112)
Creatinine, Ser: 1.1 mg/dL (ref 0.4–1.5)
Potassium: 4.3 mEq/L (ref 3.5–5.1)

## 2012-02-14 NOTE — Progress Notes (Signed)
Patient Care Team: Pola Corn, MD as PCP - General (Cardiology)   HPI  [name] is a 62 y.o. male is seen in followup for ventricular tachycardia in the setting of ischemic and nonischemic heart disease. He is status post ICD implantation.  2010 because of symptoms of progressive shortness of breath, he underwent evaluation including an echo demonstrating an ejection fraction of 40% or so and his Myoview scan demonstrated no ischemia. Repeat myoview scan 8/13 demonstrated infarct with peri-infarct ischemia.  He has some degree of exercise intolerance and shortness of breath. No significant exertional chest discomfort.  Past Medical History  Diagnosis Date  . Chronic back pain     a. s/p multiple lumbar surgeries  . GERD (gastroesophageal reflux disease)   . Hyperlipidemia   . Myasthenia gravis   . Monomorphic ventricular tachycardia     a. s/p ICD 1997;   b. 06/2007 ICD upgrade to MDT Concerto Bi-V though LV lead unable to be placed.  . Complete heart block     a. h/o transient complete heart block in April 2009  . Sciatica   . DJD (degenerative joint disease)   . Ischemic cardiomyopathy     a. 05/2009 Echo: EF 40%, mild to mod glob HK, Gr 2 dd, mild LVH, Triv AI, Mild MR, PASP 37-28mmHg  . CAD (coronary artery disease)     a. s/p PCI/BMS of prox LAD in 1997;  b. 4/10 LHC: nonobs dzs. (pRCA 50-70%);  c.   Myoview 08/23/11 : Scar with peri-infarct ischemia infecting the entire septum with a corresponding wall motion abnormality, moderate risk scan, EF 42%.    Past Surgical History  Procedure Laterality Date  . Cardiac defibrillator placement  1997    Medtronic Concerto  . Coronary angioplasty with stent placement      bare metal stenting of the LAD in 1997  . Generator change  07/1999  . Generator change  11/23/2006  . Anterior lumbar interbody fusion  04/23/2007    L4-L5   . Gallbladder surgery      Current Outpatient Prescriptions  Medication Sig Dispense  Refill  . amoxicillin (AMOXIL) 500 MG capsule Take 500 mg by mouth 2 (two) times daily.      Marland Kitchen aspirin EC 81 MG tablet Take 1 tablet (81 mg total) by mouth daily.      Marland Kitchen azaTHIOprine (IMURAN) 50 MG tablet Take 150 mg by mouth daily.       . furosemide (LASIX) 40 MG tablet Take 60 mg by mouth daily.      Marland Kitchen HYDROcodone-acetaminophen (NORCO) 7.5-325 MG per tablet as needed.      Marland Kitchen ipratropium (ATROVENT) 0.06 % nasal spray Place 2 sprays into the nose 4 (four) times daily. As needed  15 mL  12  . magnesium oxide (MAG-OX 400) 400 MG tablet Take 400 mg by mouth 2 (two) times daily.       . Nutritional Supplements (GRAPESEED EXTRACT PO) Take 2 capsules by mouth daily.      Marland Kitchen omeprazole (PRILOSEC) 20 MG capsule Take 20 mg by mouth daily.      Marland Kitchen OVER THE COUNTER MEDICATION Take 1-2 tablets by mouth daily. "blue green algae"      . OVER THE COUNTER MEDICATION Take 2 tablets by mouth 2 (two) times daily. Health food store digest enzymes tablets.      . potassium chloride SA (K-DUR,KLOR-CON) 20 MEQ tablet Take 20 mEq by mouth daily.       Marland Kitchen  predniSONE (DELTASONE) 5 MG tablet Take 5 mg by mouth every other day.        . ramipril (ALTACE) 5 MG capsule Take 5 mg by mouth daily.        . simvastatin (ZOCOR) 20 MG tablet Take 20 mg by mouth at bedtime.        . sotalol (BETAPACE) 120 MG tablet Take 120 mg by mouth 2 (two) times daily.        No current facility-administered medications for this visit.    No Known Allergies  Review of Systems negative except from HPI and PMH  Physical Exam BP 120/80  Ht 5\' 8"  (1.727 m)  Wt 180 lb 9.6 oz (81.92 kg)  BMI 27.47 kg/m2 Well developed and well nourished in no acute distress HENT normal E scleral and icterus clear Neck Supple JVP flat; carotids brisk and full. Device pocket well healed; without hematoma or erythema Clear to ausculation  Regular rate and rhythm, no murmurs gallops or rub Soft with active bowel sounds No clubbing cyanosis none  Edema Alert and oriented, grossly normal motor and sensory function Skin Warm and Dry  ECG  P-synchronous/ AV  pacing   Assessment and  Plan

## 2012-02-14 NOTE — Assessment & Plan Note (Signed)
He is at moderate risk Myoview. With ongoing symptoms of shortness of breath, we'll undertake catheterization.  We'll reassess his left ventricular function and consider the addition of either hydralazine/nitrates or Aldactone.

## 2012-02-14 NOTE — Assessment & Plan Note (Signed)
Stable post pacing 

## 2012-02-14 NOTE — Patient Instructions (Signed)

## 2012-02-14 NOTE — Assessment & Plan Note (Signed)
The patient's device was interrogated.  The information was reviewed. No changes were made in the programming.    

## 2012-02-14 NOTE — Assessment & Plan Note (Signed)
No intercurrent Ventricular tachycardia On sotalol. QTc interval was within range

## 2012-02-21 ENCOUNTER — Encounter (HOSPITAL_BASED_OUTPATIENT_CLINIC_OR_DEPARTMENT_OTHER)
Admission: RE | Disposition: A | Discharge status: Home / Self Care | Payer: Self-pay | Source: Ambulatory Visit | Attending: Cardiology

## 2012-02-21 ENCOUNTER — Inpatient Hospital Stay (HOSPITAL_BASED_OUTPATIENT_CLINIC_OR_DEPARTMENT_OTHER)
Admission: RE | Admit: 2012-02-21 | Discharge: 2012-02-21 | Disposition: A | Discharge status: Home / Self Care | Payer: Medicare Other | Source: Ambulatory Visit | Attending: Cardiology | Admitting: Cardiology

## 2012-02-21 DIAGNOSIS — I2510 Atherosclerotic heart disease of native coronary artery without angina pectoris: Secondary | ICD-10-CM | POA: Insufficient documentation

## 2012-02-21 DIAGNOSIS — I472 Ventricular tachycardia, unspecified: Secondary | ICD-10-CM

## 2012-02-21 DIAGNOSIS — I5042 Chronic combined systolic (congestive) and diastolic (congestive) heart failure: Secondary | ICD-10-CM

## 2012-02-21 DIAGNOSIS — I2589 Other forms of chronic ischemic heart disease: Secondary | ICD-10-CM

## 2012-02-21 DIAGNOSIS — I428 Other cardiomyopathies: Secondary | ICD-10-CM | POA: Insufficient documentation

## 2012-02-21 DIAGNOSIS — R0989 Other specified symptoms and signs involving the circulatory and respiratory systems: Secondary | ICD-10-CM | POA: Insufficient documentation

## 2012-02-21 DIAGNOSIS — R0609 Other forms of dyspnea: Secondary | ICD-10-CM | POA: Insufficient documentation

## 2012-02-21 DIAGNOSIS — Z95810 Presence of automatic (implantable) cardiac defibrillator: Secondary | ICD-10-CM

## 2012-02-21 DIAGNOSIS — I442 Atrioventricular block, complete: Secondary | ICD-10-CM

## 2012-02-21 LAB — POCT I-STAT 3, VENOUS BLOOD GAS (G3P V)
Bicarbonate: 28.3 mEq/L — ABNORMAL HIGH (ref 20.0–24.0)
TCO2: 30 mmol/L (ref 0–100)
pCO2, Ven: 56.1 mmHg — ABNORMAL HIGH (ref 45.0–50.0)
pH, Ven: 7.31 — ABNORMAL HIGH (ref 7.250–7.300)
pO2, Ven: 33 mmHg (ref 30.0–45.0)

## 2012-02-21 LAB — POCT I-STAT 3, ART BLOOD GAS (G3+)
TCO2: 29 mmol/L (ref 0–100)
pCO2 arterial: 49.2 mmHg — ABNORMAL HIGH (ref 35.0–45.0)
pH, Arterial: 7.349 — ABNORMAL LOW (ref 7.350–7.450)

## 2012-02-21 SURGERY — JV LEFT HEART CATHETERIZATION WITH CORONARY ANGIOGRAM

## 2012-02-21 MED ORDER — SODIUM CHLORIDE 0.9 % IV SOLN: 1.0000 mL/kg/h | INTRAVENOUS | Status: DC

## 2012-02-21 MED ORDER — ASPIRIN 81 MG PO CHEW
324.0000 mg | CHEWABLE_TABLET | ORAL | Status: AC
  Administered 2012-02-21: 324 mg via ORAL

## 2012-02-21 MED ORDER — DIAZEPAM 5 MG PO TABS
5.0000 mg | ORAL_TABLET | ORAL | Status: AC
  Administered 2012-02-21: 5 mg via ORAL

## 2012-02-21 MED ORDER — ACETAMINOPHEN 325 MG PO TABS: 650.0000 mg | ORAL_TABLET | ORAL | Status: DC | PRN

## 2012-02-21 NOTE — OR Nursing (Signed)
Dr [place] at bedside to discuss results and treatment plan with pt

## 2012-02-21 NOTE — OR Nursing (Signed)
Discharge instructions reviewed and signed, pt stated understanding, ambulated in hall without difficulty, site level 0, transported to sister's car via wheelchair 

## 2012-02-21 NOTE — OR Nursing (Signed)
Meal served 

## 2012-02-21 NOTE — OR Nursing (Signed)
Negative Allen's test right hand 

## 2012-02-21 NOTE — Interval H&P Note (Signed)
History and Physical Interval Note:  02/21/2012 9:50 AM  [name]  has presented today for surgery, with the diagnosis of cp  The various methods of treatment have been discussed with the patient and family. After consideration of risks, benefits and other options for treatment, the patient has consented to  Procedure(s): JV LEFT HEART CATHETERIZATION WITH CORONARY ANGIOGRAM (N/A) as a surgical intervention .  The patient's history has been reviewed, patient examined, no change in status, stable for surgery.  I have reviewed the patient's chart and labs.  Questions were answered to the patient's satisfaction.     Theron Arista [facility] 02/21/2012 9:50 AM

## 2012-02-21 NOTE — OR Nursing (Signed)
Tegaderm dressing applied, site level 0, bedrest begins at 1030 

## 2012-02-21 NOTE — CV Procedure (Signed)
   Cardiac Catheterization Procedure Note  Name: [name] MRN: [id number] DOB: 10-11-1950  Procedure: Right Heart Cath, Left Heart Cath, Selective Coronary Angiography, LV angiography  Indication: 61 yo BM with history of CAD and nonischemic cardiomyopathy presents with symptoms of worsening dyspnea.   Procedural Details: The right groin was prepped, draped, and anesthetized with 1% lidocaine. Using the modified Seldinger technique a 4 French sheath was placed in the right femoral artery and a 7 French sheath was placed in the right femoral vein. A Swan-Ganz catheter was used for the right heart catheterization. Standard protocol was followed for recording of right heart pressures and sampling of oxygen saturations. Fick cardiac output was calculated. Standard Judkins catheters were used for selective coronary angiography and left ventriculography. There were no immediate procedural complications. The patient was transferred to the post catheterization recovery area for further monitoring.  Procedural Findings: Hemodynamics RA 15/14 mean 12 mm Hg RV 46/9 mm Hg PA 42/16 mean 27 mm Hg PCWP 15/14 mean 13 mm Hg LV 108/18 mm Hg AO 108/67 mean 86 mm Hg  Oxygen saturations: PA 57% AO 89%  Cardiac Output (Fick) 4.1 L/min  Cardiac Index (Fick) 2.1 L/min/m2   Coronary angiography: Coronary dominance: right  Left mainstem: Normal  Left anterior descending (LAD): Diffuse 20-30% proximal disease.  Left circumflex (LCx): Less than 20 % disease proximally. The LCx gives rise to 2 marginal branches that are without significant disease.  Right coronary artery (RCA): 40% stenosis in the proximal vessel. 20-30% focal disease in the mid vessel.  Left ventriculography: Left ventricular systolic function is abnormal. There is global hypokinesis with overall EF of 40-45%. No mitral insufficiency.  Final Conclusions:   1. Nonobstructive coronary artery disease. Unchanged from September  2013. 2. Moderate LV dysfunction. 3. Mildly elevated right heart pressures with normal LV filling pressures.  Recommendations: Continue medical therapy.   Theron Arista [facility] 02/21/2012, 10:19 AM

## 2012-02-21 NOTE — H&P (View-Only) (Signed)
Patient Care Team: Jerome O Spruill, MD as PCP - General (Cardiology)   HPI  [name] is a 62 y.o. male is seen in followup for ventricular tachycardia in the setting of ischemic and nonischemic heart disease. He is status post ICD implantation.  2010 because of symptoms of progressive shortness of breath, he underwent evaluation including an echo demonstrating an ejection fraction of 40% or so and his Myoview scan demonstrated no ischemia. Repeat myoview scan 8/13 demonstrated infarct with peri-infarct ischemia.  He has some degree of exercise intolerance and shortness of breath. No significant exertional chest discomfort.  Past Medical History  Diagnosis Date  . Chronic back pain     a. s/p multiple lumbar surgeries  . GERD (gastroesophageal reflux disease)   . Hyperlipidemia   . Myasthenia gravis   . Monomorphic ventricular tachycardia     a. s/p ICD 1997;   b. 06/2007 ICD upgrade to MDT Concerto Bi-V though LV lead unable to be placed.  . Complete heart block     a. h/o transient complete heart block in April 2009  . Sciatica   . DJD (degenerative joint disease)   . Ischemic cardiomyopathy     a. 05/2009 Echo: EF 40%, mild to mod glob HK, Gr 2 dd, mild LVH, Triv AI, Mild MR, PASP 37-41mmHg  . CAD (coronary artery disease)     a. s/p PCI/BMS of prox LAD in 1997;  b. 4/10 LHC: nonobs dzs. (pRCA 50-70%);  c.   Myoview 08/23/11 : Scar with peri-infarct ischemia infecting the entire septum with a corresponding wall motion abnormality, moderate risk scan, EF 42%.    Past Surgical History  Procedure Laterality Date  . Cardiac defibrillator placement  1997    Medtronic Concerto  . Coronary angioplasty with stent placement      bare metal stenting of the LAD in 1997  . Generator change  07/1999  . Generator change  11/23/2006  . Anterior lumbar interbody fusion  04/23/2007    L4-L5   . Gallbladder surgery      Current Outpatient Prescriptions  Medication Sig Dispense  Refill  . amoxicillin (AMOXIL) 500 MG capsule Take 500 mg by mouth 2 (two) times daily.      . aspirin EC 81 MG tablet Take 1 tablet (81 mg total) by mouth daily.      . azaTHIOprine (IMURAN) 50 MG tablet Take 150 mg by mouth daily.       . furosemide (LASIX) 40 MG tablet Take 60 mg by mouth daily.      . HYDROcodone-acetaminophen (NORCO) 7.5-325 MG per tablet as needed.      . ipratropium (ATROVENT) 0.06 % nasal spray Place 2 sprays into the nose 4 (four) times daily. As needed  15 mL  12  . magnesium oxide (MAG-OX 400) 400 MG tablet Take 400 mg by mouth 2 (two) times daily.       . Nutritional Supplements (GRAPESEED EXTRACT PO) Take 2 capsules by mouth daily.      . omeprazole (PRILOSEC) 20 MG capsule Take 20 mg by mouth daily.      . OVER THE COUNTER MEDICATION Take 1-2 tablets by mouth daily. "blue green algae"      . OVER THE COUNTER MEDICATION Take 2 tablets by mouth 2 (two) times daily. Health food store digest enzymes tablets.      . potassium chloride SA (K-DUR,KLOR-CON) 20 MEQ tablet Take 20 mEq by mouth daily.       .   predniSONE (DELTASONE) 5 MG tablet Take 5 mg by mouth every other day.        . ramipril (ALTACE) 5 MG capsule Take 5 mg by mouth daily.        . simvastatin (ZOCOR) 20 MG tablet Take 20 mg by mouth at bedtime.        . sotalol (BETAPACE) 120 MG tablet Take 120 mg by mouth 2 (two) times daily.        No current facility-administered medications for this visit.    No Known Allergies  Review of Systems negative except from HPI and PMH  Physical Exam BP 120/80  Ht 5' 8" (1.727 m)  Wt 180 lb 9.6 oz (81.92 kg)  BMI 27.47 kg/m2 Well developed and well nourished in no acute distress HENT normal E scleral and icterus clear Neck Supple JVP flat; carotids brisk and full. Device pocket well healed; without hematoma or erythema Clear to ausculation  Regular rate and rhythm, no murmurs gallops or rub Soft with active bowel sounds No clubbing cyanosis none  Edema Alert and oriented, grossly normal motor and sensory function Skin Warm and Dry  ECG  P-synchronous/ AV  pacing   Assessment and  Plan  

## 2012-03-05 ENCOUNTER — Encounter: Payer: Self-pay | Admitting: Internal Medicine

## 2012-03-07 ENCOUNTER — Ambulatory Visit (INDEPENDENT_AMBULATORY_CARE_PROVIDER_SITE_OTHER): Payer: Medicare Other | Admitting: Internal Medicine

## 2012-03-07 ENCOUNTER — Encounter: Payer: Self-pay | Admitting: Internal Medicine

## 2012-03-07 VITALS — BP 120/74 | HR 90 | Ht 68.0 in | Wt 183.0 lb

## 2012-03-07 DIAGNOSIS — I472 Ventricular tachycardia: Secondary | ICD-10-CM

## 2012-03-07 DIAGNOSIS — I5042 Chronic combined systolic (congestive) and diastolic (congestive) heart failure: Secondary | ICD-10-CM

## 2012-03-07 DIAGNOSIS — I442 Atrioventricular block, complete: Secondary | ICD-10-CM

## 2012-03-07 DIAGNOSIS — I2589 Other forms of chronic ischemic heart disease: Secondary | ICD-10-CM

## 2012-03-07 LAB — ICD DEVICE OBSERVATION
AL AMPLITUDE: 2.1632 mv
ATRIAL PACING ICD: 2.42 pct
CHARGE TIME: 11.22 s
LV LEAD IMPEDENCE ICD: 16382 Ohm
RV LEAD IMPEDENCE ICD: 408 Ohm
TOT-0001: 1
TOT-0002: 0
TOT-0006: 20090619000000
TZAT-0001ATACH: 2
TZAT-0001ATACH: 3
TZAT-0001FASTVT: 1
TZAT-0001SLOWVT: 1
TZAT-0002ATACH: NEGATIVE
TZAT-0004FASTVT: 8
TZAT-0012ATACH: 150 ms
TZAT-0012ATACH: 150 ms
TZAT-0012ATACH: 150 ms
TZAT-0012SLOWVT: 200 ms
TZAT-0013FASTVT: 1
TZAT-0018ATACH: NEGATIVE
TZAT-0018SLOWVT: NEGATIVE
TZAT-0019ATACH: 6 V
TZAT-0020ATACH: 1.5 ms
TZAT-0020ATACH: 1.5 ms
TZAT-0020FASTVT: 1.5 ms
TZAT-0020SLOWVT: 1.5 ms
TZON-0003SLOWVT: 400 ms
TZON-0003VSLOWVT: 380 ms
TZON-0004SLOWVT: 16
TZON-0004VSLOWVT: 20
TZON-0005SLOWVT: 12
TZST-0001ATACH: 4
TZST-0001ATACH: 6
TZST-0001FASTVT: 2
TZST-0001FASTVT: 5
TZST-0001FASTVT: 6
TZST-0001SLOWVT: 2
TZST-0001SLOWVT: 6
TZST-0002ATACH: NEGATIVE
TZST-0002ATACH: NEGATIVE
TZST-0002SLOWVT: NEGATIVE
TZST-0002SLOWVT: NEGATIVE
TZST-0002SLOWVT: NEGATIVE
TZST-0003FASTVT: 35 J
TZST-0003FASTVT: 35 J
TZST-0003FASTVT: 35 J

## 2012-03-07 MED ORDER — FUROSEMIDE 40 MG PO TABS: 60.0000 mg | ORAL_TABLET | Freq: Every day | ORAL | Status: DC

## 2012-03-07 MED ORDER — POTASSIUM CHLORIDE CRYS ER 20 MEQ PO TBCR: 20.0000 meq | EXTENDED_RELEASE_TABLET | Freq: Every day | ORAL | Status: DC

## 2012-03-07 MED ORDER — RAMIPRIL 5 MG PO CAPS: 5.0000 mg | ORAL_CAPSULE | Freq: Every day | ORAL | Status: DC

## 2012-03-07 MED ORDER — SIMVASTATIN 20 MG PO TABS: 20.0000 mg | ORAL_TABLET | Freq: Every day | ORAL | Status: DC

## 2012-03-07 MED ORDER — ISOSORB DINITRATE-HYDRALAZINE 20-37.5 MG PO TABS: 1.0000 | ORAL_TABLET | Freq: Two times a day (BID) | ORAL | Status: DC

## 2012-03-07 MED ORDER — SOTALOL HCL 120 MG PO TABS: 120.0000 mg | ORAL_TABLET | Freq: Two times a day (BID) | ORAL | Status: DC

## 2012-03-07 NOTE — Progress Notes (Signed)
Patient Care Team: Pola Corn, MD as PCP - General (Cardiology)   HPI  [name] is a 62 y.o. male is seen in followup for ventricular tachycardia in the setting of ischemic and nonischemic heart disease. He is status post ICD implantation.  2010 because of symptoms of progressive shortness of breath, he underwent evaluation including an echo demonstrating an ejection fraction of 40% or so and his Myoview scan demonstrated no ischemia. Repeat myoview scan 8/13 demonstrated infarct with peri-infarct ischemia.catheterization February 2014 demonstrated no obstructive coronary disease.   He continues to complain of shortness of breath with dyspnea climbing stairs. He is not tremendously fit currently     Past Medical History  Diagnosis Date  . Chronic back pain     a. s/p multiple lumbar surgeries  . GERD (gastroesophageal reflux disease)   . Hyperlipidemia   . Myasthenia gravis   . Monomorphic ventricular tachycardia     a. s/p ICD 1997;   b. 06/2007 ICD upgrade to MDT Concerto Bi-V though LV lead unable to be placed.  . Complete heart block     a. h/o transient complete heart block in April 2009  . Sciatica   . DJD (degenerative joint disease)   . Ischemic cardiomyopathy     a. 05/2009 Echo: EF 40%, mild to mod glob HK, Gr 2 dd, mild LVH, Triv AI, Mild MR, PASP 37-43mmHg  . CAD (coronary artery disease)     a. s/p PCI/BMS of prox LAD in 1997;  b. 4/10 LHC: nonobs dzs. (pRCA 50-70%);  c.   Myoview 08/23/11 : Scar with peri-infarct ischemia infecting the entire septum with a corresponding wall motion abnormality, moderate risk scan, EF 42%.    Past Surgical History  Procedure Laterality Date  . Cardiac defibrillator placement  1997    Medtronic Concerto  . Coronary angioplasty with stent placement      bare metal stenting of the LAD in 1997  . Generator change  07/1999  . Generator change  11/23/2006  . Anterior lumbar interbody fusion  04/23/2007    L4-L5   .  Gallbladder surgery      Current Outpatient Prescriptions  Medication Sig Dispense Refill  . amoxicillin (AMOXIL) 500 MG capsule Take 500 mg by mouth 2 (two) times daily.      Marland Kitchen aspirin EC 81 MG tablet Take 1 tablet (81 mg total) by mouth daily.      Marland Kitchen azaTHIOprine (IMURAN) 50 MG tablet Take 150 mg by mouth daily.       . furosemide (LASIX) 40 MG tablet Take 60 mg by mouth daily.      Marland Kitchen HYDROcodone-acetaminophen (NORCO) 7.5-325 MG per tablet as needed.      Marland Kitchen ipratropium (ATROVENT) 0.06 % nasal spray Place 2 sprays into the nose 4 (four) times daily. As needed  15 mL  12  . magnesium oxide (MAG-OX 400) 400 MG tablet Take 400 mg by mouth 2 (two) times daily.       . Nutritional Supplements (GRAPESEED EXTRACT PO) Take 2 capsules by mouth daily.      Marland Kitchen omeprazole (PRILOSEC) 20 MG capsule Take 20 mg by mouth daily.      Marland Kitchen OVER THE COUNTER MEDICATION Take 1-2 tablets by mouth daily. "blue green algae"      . OVER THE COUNTER MEDICATION Take 2 tablets by mouth 2 (two) times daily. Health food store digest enzymes tablets.      . potassium chloride SA (K-DUR,KLOR-CON) 20  MEQ tablet Take 20 mEq by mouth daily.       . predniSONE (DELTASONE) 5 MG tablet Take 5 mg by mouth every other day.        . ramipril (ALTACE) 5 MG capsule Take 5 mg by mouth daily.        . simvastatin (ZOCOR) 20 MG tablet Take 20 mg by mouth at bedtime.        . sotalol (BETAPACE) 120 MG tablet Take 120 mg by mouth 2 (two) times daily.        No current facility-administered medications for this visit.    No Known Allergies  Review of Systems negative except from HPI and PMH  Physical Exam BP 120/74  Pulse 90  Ht 5\' 8"  (1.727 m)  Wt 183 lb (83.008 kg)  BMI 27.83 kg/m2 Well developed and well nourished in no acute distress HENT normal E scleral and icterus clear Neck Supple JVP flat; carotids brisk and full. Device pocket well healed; without hematoma or erythema Clear to ausculation  Regular rate and rhythm, no  murmurs gallops or rub Soft with active bowel sounds No clubbing cyanosis none Edema Alert and oriented, grossly normal motor and sensory function Skin Warm and Dry  ECG  P-synchronous/ AV  pacing   Assessment and  Plan

## 2012-03-07 NOTE — Patient Instructions (Addendum)
Your physician recommends that you schedule a follow-up appointment in: 3 months with [facility]  Your physician has recommended that you have a cardiopulmonary stress test (CPX). CPX testing is a non-invasive measurement of heart and lung function. It replaces a traditional treadmill stress test. This type of test provides a tremendous amount of information that relates not only to your present condition but also for future outcomes. This test combines measurements of you ventilation, respiratory gas exchange in the lungs, electrocardiogram (EKG), blood pressure and physical response before, during, and following an exercise protocol.  Your physician has recommended you make the following change in your medication: START BiDil once daily

## 2012-03-07 NOTE — Assessment & Plan Note (Signed)
Will do CPX to quantitate cardiac function and exercise performance. In the event that it is significantly impaired we'll undertake bilateral venography to see what the status is of his upper extremity veins for consideration of CRT upgrade versus an epicardial approach versus nothing

## 2012-03-07 NOTE — Assessment & Plan Note (Signed)
negative cath. We will start him on Bidil  It may also be appropriate to add a heart failure beta blocker to his sotalol regime. I will do Bidil firlst

## 2012-03-14 ENCOUNTER — Ambulatory Visit (HOSPITAL_COMMUNITY): Payer: Medicare Other | Attending: Internal Medicine

## 2012-03-14 DIAGNOSIS — I472 Ventricular tachycardia, unspecified: Secondary | ICD-10-CM | POA: Insufficient documentation

## 2012-03-14 DIAGNOSIS — I4729 Other ventricular tachycardia: Secondary | ICD-10-CM | POA: Insufficient documentation

## 2012-03-14 DIAGNOSIS — I5042 Chronic combined systolic (congestive) and diastolic (congestive) heart failure: Secondary | ICD-10-CM | POA: Insufficient documentation

## 2012-04-27 ENCOUNTER — Telehealth: Payer: Self-pay | Admitting: Internal Medicine

## 2012-04-27 NOTE — Telephone Encounter (Signed)
New Prob     Pt is calling back regarding a VM he received today 4/25. Pt did not know what the call was regarding.

## 2012-04-27 NOTE — Telephone Encounter (Signed)
Spoke with [name], aware of CPX testing. appt made for [name] on Wednesday next week when dr Graciela Husbands is here for reprogramming of ICD for rate response.

## 2012-05-02 ENCOUNTER — Other Ambulatory Visit: Payer: Self-pay

## 2012-05-02 ENCOUNTER — Encounter: Payer: Self-pay | Admitting: Internal Medicine

## 2012-05-02 ENCOUNTER — Ambulatory Visit (INDEPENDENT_AMBULATORY_CARE_PROVIDER_SITE_OTHER): Payer: Medicare Other | Admitting: *Deleted

## 2012-05-02 DIAGNOSIS — I442 Atrioventricular block, complete: Secondary | ICD-10-CM

## 2012-05-02 DIAGNOSIS — I5042 Chronic combined systolic (congestive) and diastolic (congestive) heart failure: Secondary | ICD-10-CM

## 2012-05-02 LAB — ICD DEVICE OBSERVATION
AL AMPLITUDE: 1.8239 mv
BAMS-0001: 170 {beats}/min
BATTERY VOLTAGE: 2.75 V
CHARGE TIME: 11.22 s
FVT: 0
LV LEAD IMPEDENCE ICD: 16382 Ohm
RV LEAD IMPEDENCE ICD: 348 Ohm
TOT-0001: 1
TOT-0002: 0
TZAT-0001ATACH: 2
TZAT-0001ATACH: 3
TZAT-0001SLOWVT: 1
TZAT-0002ATACH: NEGATIVE
TZAT-0011FASTVT: 10 ms
TZAT-0012ATACH: 150 ms
TZAT-0012ATACH: 150 ms
TZAT-0012FASTVT: 200 ms
TZAT-0013FASTVT: 1
TZAT-0018ATACH: NEGATIVE
TZAT-0018ATACH: NEGATIVE
TZAT-0018FASTVT: NEGATIVE
TZAT-0018SLOWVT: NEGATIVE
TZAT-0019ATACH: 6 V
TZAT-0019FASTVT: 8 V
TZAT-0019SLOWVT: 8 V
TZAT-0020ATACH: 1.5 ms
TZON-0003FASTVT: 250 ms
TZON-0003VSLOWVT: 380 ms
TZON-0004SLOWVT: 16
TZON-0004VSLOWVT: 20
TZON-0005SLOWVT: 12
TZST-0001ATACH: 6
TZST-0001FASTVT: 3
TZST-0001FASTVT: 6
TZST-0001SLOWVT: 2
TZST-0001SLOWVT: 4
TZST-0001SLOWVT: 6
TZST-0002ATACH: NEGATIVE
TZST-0002ATACH: NEGATIVE
TZST-0002SLOWVT: NEGATIVE
TZST-0002SLOWVT: NEGATIVE
TZST-0003FASTVT: 35 J
TZST-0003FASTVT: 35 J

## 2012-05-02 NOTE — Progress Notes (Signed)
ICD check 

## 2012-06-01 ENCOUNTER — Ambulatory Visit (INDEPENDENT_AMBULATORY_CARE_PROVIDER_SITE_OTHER): Payer: Medicare Other | Admitting: Internal Medicine

## 2012-06-01 ENCOUNTER — Encounter: Payer: Self-pay | Admitting: Internal Medicine

## 2012-06-01 ENCOUNTER — Encounter: Payer: Self-pay | Admitting: *Deleted

## 2012-06-01 VITALS — BP 101/61 | HR 92 | Ht 68.0 in | Wt 178.1 lb

## 2012-06-01 DIAGNOSIS — I472 Ventricular tachycardia: Secondary | ICD-10-CM

## 2012-06-01 DIAGNOSIS — I442 Atrioventricular block, complete: Secondary | ICD-10-CM

## 2012-06-01 DIAGNOSIS — I5042 Chronic combined systolic (congestive) and diastolic (congestive) heart failure: Secondary | ICD-10-CM

## 2012-06-01 DIAGNOSIS — Z95810 Presence of automatic (implantable) cardiac defibrillator: Secondary | ICD-10-CM

## 2012-06-01 LAB — ICD DEVICE OBSERVATION
AL AMPLITUDE: 2 mv
AL THRESHOLD: 1 V
CHARGE TIME: 11.22 s
FVT: 0
LV LEAD IMPEDENCE ICD: 16382 Ohm
RV LEAD IMPEDENCE ICD: 392 Ohm
TOT-0001: 1
TOT-0006: 20090619000000
TZAT-0001ATACH: 1
TZAT-0001FASTVT: 1
TZAT-0002ATACH: NEGATIVE
TZAT-0002ATACH: NEGATIVE
TZAT-0002SLOWVT: NEGATIVE
TZAT-0004FASTVT: 8
TZAT-0005FASTVT: 88 pct
TZAT-0012SLOWVT: 200 ms
TZAT-0013FASTVT: 1
TZAT-0018ATACH: NEGATIVE
TZAT-0019ATACH: 6 V
TZAT-0019ATACH: 6 V
TZAT-0019SLOWVT: 8 V
TZAT-0020ATACH: 1.5 ms
TZAT-0020ATACH: 1.5 ms
TZAT-0020FASTVT: 1.5 ms
TZAT-0020SLOWVT: 1.5 ms
TZON-0003SLOWVT: 400 ms
TZST-0001ATACH: 4
TZST-0001ATACH: 5
TZST-0001FASTVT: 2
TZST-0001FASTVT: 4
TZST-0001FASTVT: 5
TZST-0001SLOWVT: 3
TZST-0001SLOWVT: 5
TZST-0002ATACH: NEGATIVE
TZST-0002ATACH: NEGATIVE
TZST-0002SLOWVT: NEGATIVE
TZST-0002SLOWVT: NEGATIVE
TZST-0002SLOWVT: NEGATIVE
TZST-0002SLOWVT: NEGATIVE
TZST-0003FASTVT: 35 J
TZST-0003FASTVT: 35 J
TZST-0003FASTVT: 35 J
VF: 0

## 2012-06-01 NOTE — Progress Notes (Signed)
seborrheic keratosis include Coumadin) forf Patient Care Team: Pola Corn, MD as PCP - General (Cardiology)   HPI  [name] is a 62 y.o. male is seen in followup for ventricular tachycardia in the setting of ischemic and nonischemic heart disease. He is status post ICD implantation.  2010 because of symptoms of progressive shortness of breath, he underwent evaluation including an echo demonstrating an ejection fraction of 40% or so and his Myoview scan demonstrated no ischemia. Repeat myoview scan 8/13 demonstrated infarct with peri-infarct ischemia.catheterization February 2014 demonstrated no obstructive coronary disease.   Exercise exercise intolerance so we undertook cardiopulmonary stress testingy which showed a max heart rate of 110 beats per minute. There was also further circulatory return.   Past Medical History  Diagnosis Date  . Chronic back pain     a. s/p multiple lumbar surgeries  . GERD (gastroesophageal reflux disease)   . Hyperlipidemia   . Myasthenia gravis   . Monomorphic ventricular tachycardia     a. s/p ICD 1997;   b. 06/2007 ICD upgrade to MDT Concerto Bi-V though LV lead unable to be placed.  . Complete heart block     a. h/o transient complete heart block in April 2009  . Sciatica   . DJD (degenerative joint disease)   . Ischemic cardiomyopathy     a. 05/2009 Echo: EF 40%, mild to mod glob HK, Gr 2 dd, mild LVH, Triv AI, Mild MR, PASP 37-76mmHg  . CAD (coronary artery disease)     a. s/p PCI/BMS of prox LAD in 1997;  b. 4/10 LHC: nonobs dzs. (pRCA 50-70%);  c.   Myoview 08/23/11 : Scar with peri-infarct ischemia infecting the entire septum with a corresponding wall motion abnormality, moderate risk scan, EF 42%.    Past Surgical History  Procedure Laterality Date  . Cardiac defibrillator placement  1997    Medtronic Concerto  . Coronary angioplasty with stent placement      bare metal stenting of the LAD in 1997  . Generator change  07/1999   . Generator change  11/23/2006  . Anterior lumbar interbody fusion  04/23/2007    L4-L5   . Gallbladder surgery      Current Outpatient Prescriptions  Medication Sig Dispense Refill  . aspirin EC 81 MG tablet Take 1 tablet (81 mg total) by mouth daily.      Marland Kitchen azaTHIOprine (IMURAN) 50 MG tablet Take 150 mg by mouth daily.       . furosemide (LASIX) 40 MG tablet Take 1.5 tablets (60 mg total) by mouth daily.  135 tablet  2  . HYDROcodone-acetaminophen (NORCO) 7.5-325 MG per tablet as needed.      . isosorbide-hydrALAZINE (BIDIL) 20-37.5 MG per tablet Take 1 tablet by mouth 2 (two) times daily.  180 tablet  2  . magnesium oxide (MAG-OX 400) 400 MG tablet Take 400 mg by mouth 2 (two) times daily.       . Nutritional Supplements (GRAPESEED EXTRACT PO) Take 2 capsules by mouth daily.      Marland Kitchen omeprazole (PRILOSEC) 20 MG capsule Take 20 mg by mouth daily.      Marland Kitchen OVER THE COUNTER MEDICATION Take 1-2 tablets by mouth daily. "blue green algae"      . OVER THE COUNTER MEDICATION Take 2 tablets by mouth 2 (two) times daily. Health food store digest enzymes tablets.      . potassium chloride SA (K-DUR,KLOR-CON) 20 MEQ tablet Take 1 tablet (20 mEq total)  by mouth daily.  90 tablet  2  . predniSONE (DELTASONE) 5 MG tablet Take 5 mg by mouth every other day.        . ramipril (ALTACE) 5 MG capsule Take 1 capsule (5 mg total) by mouth daily.  90 capsule  2  . simvastatin (ZOCOR) 20 MG tablet Take 1 tablet (20 mg total) by mouth at bedtime.  90 tablet  2  . sotalol (BETAPACE) 120 MG tablet Take 1 tablet (120 mg total) by mouth 2 (two) times daily.  180 tablet  2   No current facility-administered medications for this visit.    No Known Allergies  Review of Systems negative except from HPI and PMH  Physical Exam BP 101/61  Pulse 92  Ht 5\' 8"  (1.727 m)  Wt 178 lb 2 oz (80.797 kg)  BMI 27.09 kg/m2 Well developed and nourished in no acute distress HENT normal Neck supple with  JVP-flat Clear Regular rate and rhythm, no murmurs or gallops Abd-soft with active BS No Clubbing cyanosis edema Skin-warm and dry A & Oriented  Grossly normal sensory and motor function  Skin Warm and Dry    Assessment and  Plan

## 2012-06-01 NOTE — Assessment & Plan Note (Signed)
No intercurrent Ventricular tachycardia  

## 2012-06-01 NOTE — Assessment & Plan Note (Signed)
Cardiopulmonary stress testing demonstrated a circulatory limitation as well as chronotropic incompetence with a peak heart rate of 70%. We have reprogrammed his device today and we'll see him again in a couple of months to see what impact we had on exercise performance. At that time we can consider CRT

## 2012-06-01 NOTE — Patient Instructions (Addendum)
Your physician recommends that you schedule a follow-up appointment in: 2 months with Dr Graciela Husbands

## 2012-06-01 NOTE — Assessment & Plan Note (Signed)
The patient's device was interrogated and the information was fully reviewed.  The device was reprogrammed to improve rate response

## 2012-06-29 ENCOUNTER — Ambulatory Visit (INDEPENDENT_AMBULATORY_CARE_PROVIDER_SITE_OTHER): Payer: Medicare Other | Admitting: Internal Medicine

## 2012-06-29 ENCOUNTER — Encounter: Payer: Self-pay | Admitting: Internal Medicine

## 2012-06-29 VITALS — BP 118/84 | HR 76 | Ht 68.0 in | Wt 180.2 lb

## 2012-06-29 DIAGNOSIS — R072 Precordial pain: Secondary | ICD-10-CM

## 2012-06-29 DIAGNOSIS — J309 Allergic rhinitis, unspecified: Secondary | ICD-10-CM

## 2012-06-29 DIAGNOSIS — J302 Other seasonal allergic rhinitis: Secondary | ICD-10-CM

## 2012-06-29 DIAGNOSIS — R0789 Other chest pain: Secondary | ICD-10-CM

## 2012-06-29 MED ORDER — IPRATROPIUM BROMIDE 0.03 % NA SOLN: NASAL | Status: DC

## 2012-06-29 NOTE — Patient Instructions (Addendum)
Script sent for ipratropium nasal spray to use if needed for watery nose.  You could use an otc antihistamine like Claritin/ loratadine if needed  Please call if we can help

## 2012-06-29 NOTE — Progress Notes (Signed)
08/26/11- 7 yoM former smoker referred courtesy of Dr Shana Chute for allergy evaluation of perennial chronic rhinitis and conjunctivitis. Complicating medical history of myasthenia gravis, CAD/ischemic CM/VT/chronic CHF/AICD  Patient c/o runny nose, post nasal drip, and headaches.  Symptoms have been bothersome for years, coming and going for several days at a time. He doesn't recognize a definite seasonal pattern . Has tried antibiotics and nasal saline sprays. He denies asthma but has occasional dry cough with scratchy throat that he blames on post nasal drip. Not much history of sinus infection. No ENT surgery. He is a disabled Journalist, newspaper  09/26/11- 50 yoM former smoker referred courtesy of Dr Shana Chute for allergy evaluation of perennial chronic rhinitis and conjunctivitis. Complicating medical history of myasthenia gravis, CAD/ischemic CM/VT/chronic CHF/AICD Review labs in detail;. couldn't tell any difference with using Dymista or antihistamine. Still some postnasal drip and complains of rattle in throat if he laughs. Little cough. Denies choking with meals. Allergy Profile 08/26/2011-total IgE 32. Elevations for dust mite, [place] grass.  12/30/11- 60 yoM former smoker referred courtesy of Dr Shana Chute for allergy evaluation of perennial chronic rhinitis and conjunctivitis. Complicating medical history of myasthenia gravis, CAD/ischemic CM/VT/chronic CHF/AICD Allergies are unchanged -- still has a runny nose with clear to yellow drainage.  PND and cough have improved.  Using OTC antihistamine but has to double up the dose. Still postnasal drainage but cough is better. Being off ACE inhibitor/ramipril and being on omeprazole have made no obvious difference. We are shy about using anticholinergics because of his history of myasthenia gravis, but epocrates makes no mention of concern with ipratropium. After discussion, he is going to try it.. Failed Dymista nasal spray.  06/29/12- 73 yoM former  smoker referred courtesy of Dr Shana Chute for allergy evaluation of perennial chronic rhinitis and conjunctivitis.Complicating medical history of myasthenia gravis, CAD/ischemic CM/VT/chronic CHF/AICD 6 month follow up.  Allergies are better.  Runny nose at times.  Dry cough at times.    ROS-see HPI Constitutional:   No-   weight loss, night sweats, fevers, chills, fatigue, lassitude. HEENT:   No-  headaches, difficulty swallowing, tooth/dental problems, sore throat,       No-  sneezing, itching, ear ache, +nasal congestion, +post nasal drip,  CV:  No-   chest pain, orthopnea, PND, swelling in lower extremities, anasarca,  dizziness, palpitations Resp:  No acute  shortness of breath with exertion or at rest.              No-   productive cough,  + little non-productive cough,  No- coughing up of blood.              No-   change in color of mucus.  No- wheezing.   Skin: No-   rash or lesions. GI:  No-   heartburn, indigestion, abdominal pain, nausea, vomiting,  GU:  MS:  No-   joint pain or swelling.   Neuro-     nothing unusual Psych:  No- change in mood or affect. No depression or anxiety.  No memory loss.  OBJ- Physical Exam General- Alert, Oriented, Affect-appropriate, Distress- none acute, trim Skin- rash-none, lesions- none, excoriation- none Lymphadenopathy- none Head- atraumatic            Eyes- Gross vision intact, PERRLA, conjunctivae and secretions clear            Ears- Hearing, canals-normal            Nose- clear, no-Septal dev, polyps, erosion, perforation  Throat- Mallampati II-III , +mucosa red posterior pharynx , drainage- none, tonsils- atrophic Neck- flexible , trachea midline, no stridor , thyroid nl, carotid no bruit Chest - symmetrical excursion , unlabored           Heart/CV- RRR , no murmur , no gallop  , no rub, nl s1 s2                           - JVD- none , edema- none, stasis changes- none, varices- none           Lung- clear to P&A, wheeze- none,  cough- none , dullness-none, rub- none           Chest wall-  Abd-  Br/ Gen/ Rectal- Not done, not indicated Extrem- cyanosis- none, clubbing, none, atrophy- none, strength- nl Neuro- grossly intact to observation

## 2012-07-15 NOTE — Assessment & Plan Note (Addendum)
Symptomatically improved. Would like to see less posterior pharyngeal irritation. He is not describing reflux. Plan- try ipratropium 0.03 when needed for watery rhinorrhea

## 2012-07-15 NOTE — Assessment & Plan Note (Signed)
Reflux precautions discussed and symptoms suggesting reflux were discussed with him

## 2012-08-13 NOTE — Progress Notes (Signed)
ELECTROPHYSIOLOGY OFFICE NOTE  Patient ID: [name] MRN: [id number], DOB/AGE: 05-Jan-1950   Date of Visit: 08/14/2012  Primary Physician: Donia Guiles, MD Primary Cardiologist: Berton Mount, MD Reason for Visit: EP/device follow-up  History of Present Illness  [name] is a 62 y.o. male with an ischemic CM s/p ICD implant 1997, attempted upgrade to CRT-D back in 2009 (unable to place LV lead), complete heart block, paroxysmal VT managed with sotalol and CAD who presents today for routine electrophysiology followup. He was last seen by Dr. Graciela Husbands in May 2014 at which time he was experiencing exercise intolerance. Cardiopulmonary stress testing was performed which revealed chronotropic incompetence. His device was reprogrammed to optimize rate response and he presents today for follow-up.  Since last being seen in our clinic, he reports he is doing well and has no complaints. He denies chest pain or shortness of breath. He denies palpitations, dizziness, near syncope or syncope. He denies LE swelling, orthopnea, PND or recent weight gain. He is compliant and tolerating medications without difficulty.  Past Medical History Past Medical History  Diagnosis Date  . Chronic back pain     a. s/p multiple lumbar surgeries  . GERD (gastroesophageal reflux disease)   . Hyperlipidemia   . Myasthenia gravis   . Monomorphic ventricular tachycardia     a. s/p ICD 1997;   b. 06/2007 ICD upgrade to MDT Concerto Bi-V though LV lead unable to be placed.  . Complete heart block     a. h/o transient complete heart block in April 2009  . Sciatica   . DJD (degenerative joint disease)   . Ischemic cardiomyopathy     a. 05/2009 Echo: EF 40%, mild to mod glob HK, Gr 2 dd, mild LVH, Triv AI, Mild MR, PASP 37-5mmHg  . CAD (coronary artery disease)     a. s/p PCI/BMS of prox LAD in 1997;  b. 4/10 LHC: nonobs dzs. (pRCA 50-70%);  c.   Myoview 08/23/11 : Scar with peri-infarct ischemia infecting the  entire septum with a corresponding wall motion abnormality, moderate risk scan, EF 42%.    Past Surgical History Past Surgical History  Procedure Laterality Date  . Cardiac defibrillator placement  1997    Medtronic Concerto  . Coronary angioplasty with stent placement      bare metal stenting of the LAD in 1997  . Generator change  07/1999  . Generator change  11/23/2006  . Anterior lumbar interbody fusion  04/23/2007    L4-L5   . Gallbladder surgery      Allergies/Intolerances No Known Allergies  Current Home Medications Current Outpatient Prescriptions  Medication Sig Dispense Refill  . aspirin EC 81 MG tablet Take 1 tablet (81 mg total) by mouth daily.      Marland Kitchen azaTHIOprine (IMURAN) 50 MG tablet Take 150 mg by mouth daily.       . furosemide (LASIX) 40 MG tablet Take 1.5 tablets (60 mg total) by mouth daily.  135 tablet  2  . HYDROcodone-acetaminophen (NORCO) 7.5-325 MG per tablet as needed.      Marland Kitchen ipratropium (ATROVENT) 0.03 % nasal spray 1 or 2 puffs each nostril twice daily as needed  30 mL  12  . isosorbide-hydrALAZINE (BIDIL) 20-37.5 MG per tablet Take 1 tablet by mouth 2 (two) times daily.  180 tablet  2  . magnesium oxide (MAG-OX 400) 400 MG tablet Take 400 mg by mouth 2 (two) times daily.       . Nutritional  Supplements (GRAPESEED EXTRACT PO) Take 2 capsules by mouth daily.      Marland Kitchen omeprazole (PRILOSEC) 20 MG capsule Take 20 mg by mouth daily.      Marland Kitchen OVER THE COUNTER MEDICATION Take 1-2 tablets by mouth daily. "blue green algae"      . OVER THE COUNTER MEDICATION Take 2 tablets by mouth 2 (two) times daily. Health food store digest enzymes tablets.      . potassium chloride SA (K-DUR,KLOR-CON) 20 MEQ tablet Take 1 tablet (20 mEq total) by mouth daily.  90 tablet  2  . predniSONE (DELTASONE) 5 MG tablet Take 5 mg by mouth every other day.        . ramipril (ALTACE) 5 MG capsule Take 1 capsule (5 mg total) by mouth daily.  90 capsule  2  . simvastatin (ZOCOR) 20 MG tablet  Take 1 tablet (20 mg total) by mouth at bedtime.  90 tablet  2  . sotalol (BETAPACE) 120 MG tablet Take 1 tablet (120 mg total) by mouth 2 (two) times daily.  180 tablet  2   No current facility-administered medications for this visit.   Social History Social History  . Marital Status: Single   Social History Main Topics  . Smoking status: Former Smoker -- 0.30 packs/day for 6 years    Types: Cigarettes    Quit date: 01/03/1982  . Smokeless tobacco: Never Used  . Alcohol Use: Yes     Comment: occasional  . Drug Use: No   Review of Systems General: No chills, fever, night sweats or weight changes Cardiovascular: No chest pain, dyspnea on exertion, edema, orthopnea, palpitations, paroxysmal nocturnal dyspnea Dermatological: No rash, lesions or masses Respiratory: No cough, dyspnea Urologic: No hematuria, dysuria Abdominal: No nausea, vomiting, diarrhea, bright red blood per rectum, melena, or hematemesis Neurologic: No visual changes, weakness, changes in mental status All other systems reviewed and are otherwise negative except as noted above.  Physical Exam Vitals: Blood pressure 126/86, pulse 84, height 5\' 8"  (1.727 m), weight 184 lb 12.8 oz (83.825 kg), SpO2 94.00%.  General: Well developed, well appearing 62 y.o. male in no acute distress. HEENT: Normocephalic, atraumatic. EOMs intact. Sclera nonicteric. Oropharynx clear.  Neck: Supple. No JVD. Lungs: Respirations regular and unlabored, CTA bilaterally. No wheezes, rales or rhonchi. Heart: RRR. S1, S2 present. No murmurs, rub, S3 or S4. Abdomen: Soft, non-distended. BS present x 4 quadrants. No hepatosplenomegaly.  Extremities: No clubbing, cyanosis or edema. DP/PT/Radials 2+ and equal bilaterally. Psych: Normal affect. Neuro: Alert and oriented X 3. Moves all extremities spontaneously.   Diagnostics Device interrogation today - Normal device function. Thresholds and sensing consistent with previous device measurements.  Impedance trends stable over time. No evidence of any ventricular arrhythmias. No mode switches. Histogram distribution appropriate for patient and level of activity. No changes made this session.  Assessment and Plan 1. Ischemic CM s/p ICD implant  - attempted upgrade to CRT-D back in 2009, unable to place LV lead - normal dual chamber ICD function - no programming changes made - continue routine device follow-up every 3 months - return for follow-up with Dr. Graciela Husbands in 3 months 2. Chronic systolic HF - stable; euvolemic by exam - OptiVol low and stable - continue current regimen 3. Paroxysmal VT - stable without intercurrent ventricular arrhythmias - continue current regimen 3. Complete heart block - V paced >99.6% 4. CAD - stable without anginal symptoms - continue current regimen  Signed, Xayne Brumbaugh, PA-C 08/14/2012, 12:00 PM

## 2012-08-14 ENCOUNTER — Ambulatory Visit (INDEPENDENT_AMBULATORY_CARE_PROVIDER_SITE_OTHER): Payer: Medicare Other | Admitting: Cardiology

## 2012-08-14 ENCOUNTER — Encounter: Payer: Self-pay | Admitting: Cardiology

## 2012-08-14 VITALS — BP 126/86 | HR 84 | Ht 68.0 in | Wt 184.8 lb

## 2012-08-14 DIAGNOSIS — I5042 Chronic combined systolic (congestive) and diastolic (congestive) heart failure: Secondary | ICD-10-CM

## 2012-08-14 DIAGNOSIS — I472 Ventricular tachycardia, unspecified: Secondary | ICD-10-CM

## 2012-08-14 DIAGNOSIS — I255 Ischemic cardiomyopathy: Secondary | ICD-10-CM

## 2012-08-14 DIAGNOSIS — I2510 Atherosclerotic heart disease of native coronary artery without angina pectoris: Secondary | ICD-10-CM

## 2012-08-14 DIAGNOSIS — Z95810 Presence of automatic (implantable) cardiac defibrillator: Secondary | ICD-10-CM

## 2012-08-14 DIAGNOSIS — I2589 Other forms of chronic ischemic heart disease: Secondary | ICD-10-CM

## 2012-08-14 DIAGNOSIS — I442 Atrioventricular block, complete: Secondary | ICD-10-CM

## 2012-08-14 LAB — ICD DEVICE OBSERVATION
AL AMPLITUDE: 1.8 mv
AL IMPEDENCE ICD: 504 Ohm
AL THRESHOLD: 1 V
BAMS-0001: 170 {beats}/min
FVT: 0
RV LEAD AMPLITUDE: 4.3 mv
RV LEAD IMPEDENCE ICD: 348 Ohm
TZAT-0001ATACH: 1
TZAT-0002ATACH: NEGATIVE
TZAT-0002ATACH: NEGATIVE
TZAT-0002ATACH: NEGATIVE
TZAT-0002SLOWVT: NEGATIVE
TZAT-0004FASTVT: 8
TZAT-0005FASTVT: 88 pct
TZAT-0012ATACH: 150 ms
TZAT-0012FASTVT: 200 ms
TZAT-0018ATACH: NEGATIVE
TZAT-0018ATACH: NEGATIVE
TZAT-0018SLOWVT: NEGATIVE
TZAT-0019ATACH: 6 V
TZAT-0019ATACH: 6 V
TZAT-0019SLOWVT: 8 V
TZAT-0020ATACH: 1.5 ms
TZAT-0020FASTVT: 1.5 ms
TZST-0001ATACH: 4
TZST-0001ATACH: 5
TZST-0001FASTVT: 2
TZST-0001FASTVT: 4
TZST-0001SLOWVT: 5
TZST-0001SLOWVT: 6
TZST-0002ATACH: NEGATIVE
TZST-0002ATACH: NEGATIVE
TZST-0002SLOWVT: NEGATIVE
TZST-0002SLOWVT: NEGATIVE
TZST-0003FASTVT: 35 J
TZST-0003FASTVT: 35 J

## 2012-08-14 NOTE — Patient Instructions (Addendum)
Your physician recommends that you schedule a follow-up appointment in: 3 MONTHS WITH DR. Graciela Husbands FOR DEVICE CHECK  Your physician recommends that you continue on your current medications as directed. Please refer to the Current Medication list given to you today.  ASK YOUR PCP ABOUT INCREASING YOUR PRILOSEC (YOUR STOMACH MEDICATION)

## 2012-09-19 ENCOUNTER — Encounter: Payer: Self-pay | Admitting: Internal Medicine

## 2012-09-30 ENCOUNTER — Other Ambulatory Visit: Payer: Self-pay | Admitting: Internal Medicine

## 2012-10-18 ENCOUNTER — Encounter: Payer: Self-pay | Admitting: Internal Medicine

## 2012-11-20 ENCOUNTER — Ambulatory Visit (INDEPENDENT_AMBULATORY_CARE_PROVIDER_SITE_OTHER): Payer: Medicare Other | Admitting: Internal Medicine

## 2012-11-20 ENCOUNTER — Encounter (INDEPENDENT_AMBULATORY_CARE_PROVIDER_SITE_OTHER): Payer: Self-pay

## 2012-11-20 ENCOUNTER — Encounter: Payer: Self-pay | Admitting: Internal Medicine

## 2012-11-20 VITALS — BP 129/79 | HR 80 | Ht 68.0 in | Wt 182.4 lb

## 2012-11-20 DIAGNOSIS — I472 Ventricular tachycardia, unspecified: Secondary | ICD-10-CM

## 2012-11-20 DIAGNOSIS — I5042 Chronic combined systolic (congestive) and diastolic (congestive) heart failure: Secondary | ICD-10-CM

## 2012-11-20 DIAGNOSIS — I442 Atrioventricular block, complete: Secondary | ICD-10-CM

## 2012-11-20 DIAGNOSIS — Z95810 Presence of automatic (implantable) cardiac defibrillator: Secondary | ICD-10-CM

## 2012-11-20 LAB — MDC_IDC_ENUM_SESS_TYPE_INCLINIC
Brady Statistic AP VP Percent: 25.54 %
Brady Statistic AP VS Percent: 0.07 %
Brady Statistic AS VP Percent: 73.05 %
Brady Statistic AS VS Percent: 1.34 %
Brady Statistic RV Percent Paced: 98.59 %
HighPow Impedance: 38 Ohm
Lead Channel Impedance Value: 352 Ohm
Lead Channel Pacing Threshold Amplitude: 1 V
Lead Channel Pacing Threshold Amplitude: 1 V
Lead Channel Pacing Threshold Pulse Width: 0.4 ms
Lead Channel Pacing Threshold Pulse Width: 0.8 ms
Lead Channel Setting Pacing Amplitude: 2 V
Lead Channel Setting Pacing Amplitude: 2.5 V
Lead Channel Setting Pacing Pulse Width: 0.4 ms
Lead Channel Setting Sensing Sensitivity: 0.3 mV
Zone Setting Detection Interval: 250 ms
Zone Setting Detection Interval: 350 ms
Zone Setting Detection Interval: 380 ms
Zone Setting Detection Interval: 400 ms

## 2012-11-20 NOTE — Assessment & Plan Note (Signed)
anitcipate CRt upgrade at change out

## 2012-11-20 NOTE — Progress Notes (Signed)
seborrheic keratosis include Coumadin) forf Patient Care Team: Pola Corn, MD as PCP - General (Cardiology)   HPI  [name] is a 62 y.o. male is seen in followup for ventricular tachycardia in the setting of ischemic and nonischemic heart disease. He is status post ICD implantation.  2010 because of symptoms of progressive shortness of breath, he underwent evaluation including an echo demonstrating an ejection fraction of 40% or so and his Myoview scan demonstrated no ischemia. Repeat myoview scan 8/13 demonstrated infarct with peri-infarct ischemia.catheterization February 2014 demonstrated no obstructive coronary disease.   Exercise exercise intolerance so we undertook cardiopulmonary stress testingy which showed a max heart rate of 110 beats per minute. Rate response was improved and an interval visit in August symptoms were better    Previously implanted ICD was associated with failure   to cannulate the coronary sinus from the right side . The comment from the procedure and, interestingly, suggests that the left subclavian vein was patent.   Past Medical History  Diagnosis Date  . Chronic back pain     a. s/p multiple lumbar surgeries  . GERD (gastroesophageal reflux disease)   . Hyperlipidemia   . Myasthenia gravis   . Monomorphic ventricular tachycardia     a. s/p ICD 1997;   b. 06/2007 ICD upgrade to MDT Concerto Bi-V though LV lead unable to be placed.  . Complete heart block     a. h/o transient complete heart block in April 2009  . Sciatica   . DJD (degenerative joint disease)   . Ischemic cardiomyopathy     a. 05/2009 Echo: EF 40%, mild to mod glob HK, Gr 2 dd, mild LVH, Triv AI, Mild MR, PASP 37-39mmHg  . CAD (coronary artery disease)     a. s/p PCI/BMS of prox LAD in 1997;  b. 4/10 LHC: nonobs dzs. (pRCA 50-70%);  c.   Myoview 08/23/11 : Scar with peri-infarct ischemia infecting the entire septum with a corresponding wall motion abnormality, moderate risk  scan, EF 42%.    Past Surgical History  Procedure Laterality Date  . Cardiac defibrillator placement  1997    Medtronic Concerto  . Coronary angioplasty with stent placement      bare metal stenting of the LAD in 1997  . Generator change  07/1999  . Generator change  11/23/2006  . Anterior lumbar interbody fusion  04/23/2007    L4-L5   . Gallbladder surgery      Current Outpatient Prescriptions  Medication Sig Dispense Refill  . aspirin EC 81 MG tablet Take 1 tablet (81 mg total) by mouth daily.      Marland Kitchen azaTHIOprine (IMURAN) 50 MG tablet Take 150 mg by mouth daily.       Marland Kitchen BIDIL 20-37.5 MG per tablet TAKE ONE TABLET BY MOUTH TWICE DAILY  180 tablet  2  . cyclobenzaprine (FLEXERIL) 10 MG tablet 10 mg as needed.      . furosemide (LASIX) 40 MG tablet Take 1.5 tablets (60 mg total) by mouth daily.  135 tablet  2  . HYDROcodone-acetaminophen (NORCO) 7.5-325 MG per tablet as needed.      Marland Kitchen ipratropium (ATROVENT) 0.03 % nasal spray 1 or 2 puffs each nostril twice daily as needed  30 mL  12  . magnesium oxide (MAG-OX 400) 400 MG tablet Take 400 mg by mouth 2 (two) times daily.       . Nutritional Supplements (GRAPESEED EXTRACT PO) Take 2 capsules by mouth daily.      Marland Kitchen  omeprazole (PRILOSEC) 20 MG capsule Take 20 mg by mouth daily.      Marland Kitchen OVER THE COUNTER MEDICATION Take 1-2 tablets by mouth daily. "blue green algae"      . OVER THE COUNTER MEDICATION Take 2 tablets by mouth 2 (two) times daily. Health food store digest enzymes tablets.      . potassium chloride SA (K-DUR,KLOR-CON) 20 MEQ tablet Take 1 tablet (20 mEq total) by mouth daily.  90 tablet  2  . predniSONE (DELTASONE) 5 MG tablet Take 5 mg by mouth every other day.        . ramipril (ALTACE) 5 MG capsule Take 1 capsule (5 mg total) by mouth daily.  90 capsule  2  . simvastatin (ZOCOR) 20 MG tablet Take 1 tablet (20 mg total) by mouth at bedtime.  90 tablet  2  . sotalol (BETAPACE) 120 MG tablet Take 1 tablet (120 mg total) by  mouth 2 (two) times daily.  180 tablet  2   No current facility-administered medications for this visit.    No Known Allergies  Review of Systems negative except from HPI and PMH  Physical Exam BP 129/79  Pulse 80  Ht 5\' 8"  (1.727 m)  Wt 182 lb 6.4 oz (82.736 kg)  BMI 27.74 kg/m2 Well developed and nourished in no acute distress HENT normal Neck supple with JVP-flat Clear Regular rate and rhythm, no murmurs or gallops Abd-soft with active BS No Clubbing cyanosis edema Skin-warm and dry A & Oriented  Grossly normal sensory and motor function  Skin Warm and Dry    Assessment and  Plan

## 2012-11-20 NOTE — Assessment & Plan Note (Addendum)
Approaching ERI . As noted previously noted failure to cannulate the coronary sinus from the right side. It also was noted there is patency of the left subclavian vein??

## 2012-11-20 NOTE — Patient Instructions (Signed)
Your physician recommends that you continue on your current medications as directed. Please refer to the Current Medication list given to you today.  Your physician recommends that you schedule a follow-up appointment in: one month with Dr. Tenny Craw

## 2012-11-20 NOTE — Assessment & Plan Note (Signed)
No intercurrent Ventricular tachycardia  

## 2012-12-05 ENCOUNTER — Encounter: Payer: Self-pay | Admitting: Internal Medicine

## 2012-12-13 ENCOUNTER — Other Ambulatory Visit: Payer: Self-pay | Admitting: Neurological Surgery

## 2012-12-13 DIAGNOSIS — M47812 Spondylosis without myelopathy or radiculopathy, cervical region: Secondary | ICD-10-CM

## 2012-12-25 ENCOUNTER — Ambulatory Visit (INDEPENDENT_AMBULATORY_CARE_PROVIDER_SITE_OTHER): Payer: Medicare Other | Admitting: *Deleted

## 2012-12-25 DIAGNOSIS — I2589 Other forms of chronic ischemic heart disease: Secondary | ICD-10-CM

## 2012-12-25 LAB — MDC_IDC_ENUM_SESS_TYPE_INCLINIC
Battery Voltage: 2.62 V
Brady Statistic AP VP Percent: 18.82 %
Brady Statistic AP VS Percent: 0 %
Brady Statistic AS VP Percent: 81.17 %
Date Time Interrogation Session: 20141223094347
HighPow Impedance: 40 Ohm
Lead Channel Impedance Value: 376 Ohm
Lead Channel Impedance Value: 536 Ohm
Lead Channel Sensing Intrinsic Amplitude: 1.4421
Lead Channel Setting Pacing Amplitude: 2.5 V
Lead Channel Setting Pacing Pulse Width: 0.4 ms
Zone Setting Detection Interval: 250 ms
Zone Setting Detection Interval: 300 ms
Zone Setting Detection Interval: 350 ms
Zone Setting Detection Interval: 400 ms

## 2012-12-25 NOTE — Progress Notes (Signed)
Battery check only today.  Battery remains @ 2.62 but has not reached ERI.  ROV in January with the device clinic.

## 2013-01-02 ENCOUNTER — Other Ambulatory Visit: Payer: Self-pay | Admitting: Neurological Surgery

## 2013-01-02 DIAGNOSIS — M47812 Spondylosis without myelopathy or radiculopathy, cervical region: Secondary | ICD-10-CM

## 2013-01-09 ENCOUNTER — Ambulatory Visit
Admission: RE | Admit: 2013-01-09 | Discharge: 2013-01-09 | Disposition: A | Discharge status: Home / Self Care | Payer: 59 | Source: Ambulatory Visit | Attending: Neurological Surgery | Admitting: Neurological Surgery

## 2013-01-09 DIAGNOSIS — M47812 Spondylosis without myelopathy or radiculopathy, cervical region: Secondary | ICD-10-CM

## 2013-01-09 MED ORDER — IOHEXOL 300 MG/ML  SOLN
100.0000 mL | Freq: Once | INTRAMUSCULAR | Status: AC | PRN
  Administered 2013-01-09: 100 mL via INTRAVENOUS

## 2013-01-13 ENCOUNTER — Other Ambulatory Visit: Payer: Self-pay | Admitting: Internal Medicine

## 2013-01-18 ENCOUNTER — Encounter: Payer: Self-pay | Admitting: Internal Medicine

## 2013-01-28 ENCOUNTER — Ambulatory Visit (INDEPENDENT_AMBULATORY_CARE_PROVIDER_SITE_OTHER): Payer: 59 | Admitting: *Deleted

## 2013-01-28 DIAGNOSIS — Z4502 Encounter for adjustment and management of automatic implantable cardiac defibrillator: Secondary | ICD-10-CM

## 2013-01-28 LAB — MDC_IDC_ENUM_SESS_TYPE_INCLINIC
Battery Voltage: 2.62 V
Brady Statistic AP VS Percent: 0 %
Brady Statistic RA Percent Paced: 16.89 %
Brady Statistic RV Percent Paced: 99.86 %
HighPow Impedance: 41 Ohm
HighPow Impedance: 50 Ohm
Lead Channel Impedance Value: 480 Ohm
Lead Channel Setting Pacing Amplitude: 2 V
Lead Channel Setting Pacing Pulse Width: 0.4 ms
Lead Channel Setting Sensing Sensitivity: 0.3 mV
MDC IDC MSMT LEADCHNL RA SENSING INTR AMPL: 1.3149
MDC IDC MSMT LEADCHNL RV IMPEDANCE VALUE: 368 Ohm
MDC IDC SESS DTM: 20150126102053
MDC IDC SET LEADCHNL RV PACING AMPLITUDE: 2.5 V
MDC IDC SET ZONE DETECTION INTERVAL: 300 ms
MDC IDC SET ZONE DETECTION INTERVAL: 350 ms
MDC IDC STAT BRADY AP VP PERCENT: 16.89 %
MDC IDC STAT BRADY AS VP PERCENT: 82.97 %
MDC IDC STAT BRADY AS VS PERCENT: 0.14 %
Zone Setting Detection Interval: 250 ms
Zone Setting Detection Interval: 380 ms
Zone Setting Detection Interval: 400 ms

## 2013-01-28 NOTE — Progress Notes (Signed)
Battery reached RRT on 12/28/12.   ROV w/ Brooke 02/06/13 @ 2:30

## 2013-02-06 ENCOUNTER — Encounter: Payer: Self-pay | Admitting: Cardiology

## 2013-02-06 ENCOUNTER — Encounter (INDEPENDENT_AMBULATORY_CARE_PROVIDER_SITE_OTHER): Payer: Self-pay

## 2013-02-06 ENCOUNTER — Ambulatory Visit (INDEPENDENT_AMBULATORY_CARE_PROVIDER_SITE_OTHER): Payer: Medicare Other | Admitting: Cardiology

## 2013-02-06 ENCOUNTER — Encounter: Payer: Self-pay | Admitting: *Deleted

## 2013-02-06 VITALS — BP 126/52 | HR 92 | Ht 68.0 in | Wt 187.0 lb

## 2013-02-06 DIAGNOSIS — Z95810 Presence of automatic (implantable) cardiac defibrillator: Secondary | ICD-10-CM

## 2013-02-06 DIAGNOSIS — I5022 Chronic systolic (congestive) heart failure: Secondary | ICD-10-CM

## 2013-02-06 DIAGNOSIS — I472 Ventricular tachycardia, unspecified: Secondary | ICD-10-CM

## 2013-02-06 DIAGNOSIS — I2589 Other forms of chronic ischemic heart disease: Secondary | ICD-10-CM

## 2013-02-06 DIAGNOSIS — I429 Cardiomyopathy, unspecified: Secondary | ICD-10-CM

## 2013-02-06 DIAGNOSIS — I442 Atrioventricular block, complete: Secondary | ICD-10-CM

## 2013-02-06 DIAGNOSIS — I428 Other cardiomyopathies: Secondary | ICD-10-CM

## 2013-02-06 DIAGNOSIS — Z4502 Encounter for adjustment and management of automatic implantable cardiac defibrillator: Secondary | ICD-10-CM

## 2013-02-06 DIAGNOSIS — I4729 Other ventricular tachycardia: Secondary | ICD-10-CM

## 2013-02-06 DIAGNOSIS — I255 Ischemic cardiomyopathy: Secondary | ICD-10-CM

## 2013-02-06 DIAGNOSIS — Z01810 Encounter for preprocedural cardiovascular examination: Secondary | ICD-10-CM

## 2013-02-06 NOTE — Progress Notes (Signed)
Patient ID: [name] MRN: [id number], DOB/AGE: [birth year]   Date of Visit: 02/06/2013  Primary Physician: Patricia Nettle, MD Primary Cardiologist: Jolyn Nap, MD  Reason for Visit: EP/device follow-up   History of Present Illness  [name] is a 63 y.o. male with an ischemic CM s/p ICD implant 1997, attempted upgrade to CRT-D back in 2009 (unable to place LV lead), complete heart block, paroxysmal VT managed with sotalol and CAD who presents today for electrophysiology followup. His ICD battery is at [facility].  He was last seen by Dr. Caryl Comes in November 2014 at which time the plan was to again attempt CRT upgrade at change out.   Since last being seen in our clinic, he reports he is doing well and has no complaints. He has dyspnea with moderate exertion. He denies chest pain. He denies palpitations, dizziness, near syncope or syncope. He denies LE swelling, orthopnea or PND or recent weight gain. He is compliant and tolerating medications without difficulty.   Past Medical History Past Medical History  Diagnosis Date  . Chronic back pain     a. s/p multiple lumbar surgeries  . GERD (gastroesophageal reflux disease)   . Hyperlipidemia   . Myasthenia gravis   . Monomorphic ventricular tachycardia     a. s/p ICD 1997;   b. 06/2007 ICD upgrade to MDT Concerto Bi-V though LV lead unable to be placed.  . Complete heart block     a. h/o transient complete heart block in April 2009  . Sciatica   . DJD (degenerative joint disease)   . Ischemic cardiomyopathy     a. 05/2009 Echo: EF 40%, mild to mod glob HK, Gr 2 dd, mild LVH, Triv AI, Mild MR, PASP 37-46mmHg  . CAD (coronary artery disease)     a. s/p PCI/BMS of prox LAD in 1997;  b. 4/10 LHC: nonobs dzs. (pRCA 50-70%);  c.   Myoview 08/23/11 : Scar with peri-infarct ischemia infecting the entire septum with a corresponding wall motion abnormality, moderate risk scan, EF 42%.    Past Surgical History Past Surgical History    Procedure Laterality Date  . Cardiac defibrillator placement  [place]  . Coronary angioplasty with stent placement      bare metal stenting of the LAD in 1997  . Generator change  07/1999  . Generator change  11/23/2006  . Anterior lumbar interbody fusion  04/23/2007    L4-L5   . Gallbladder surgery      Allergies/Intolerances No Known Allergies  Current Home Medications Current Outpatient Prescriptions  Medication Sig Dispense Refill  . aspirin EC 81 MG tablet Take 1 tablet (81 mg total) by mouth daily.      Marland Kitchen azaTHIOprine (IMURAN) 50 MG tablet Take 150 mg by mouth daily.       Marland Kitchen BIDIL 20-37.5 MG per tablet TAKE ONE TABLET BY MOUTH TWICE DAILY  180 tablet  2  . cyclobenzaprine (FLEXERIL) 10 MG tablet 10 mg as needed.      . furosemide (LASIX) 40 MG tablet Take 1.5 tablets (60 mg total) by mouth daily.  135 tablet  2  . HYDROcodone-acetaminophen (NORCO) 7.5-325 MG per tablet as needed.      Marland Kitchen ipratropium (ATROVENT) 0.03 % nasal spray 1 or 2 puffs each nostril twice daily as needed  30 mL  12  . magnesium oxide (MAG-OX) 400 MG tablet TAKE ONE TABLET BY MOUTH EVERY DAY  30 tablet  11  .  Nutritional Supplements (GRAPESEED EXTRACT PO) Take 2 capsules by mouth daily.      Marland Kitchen omeprazole (PRILOSEC) 20 MG capsule Take 20 mg by mouth daily.      Marland Kitchen OVER THE COUNTER MEDICATION Take 1-2 tablets by mouth daily. "blue green algae"      . OVER THE COUNTER MEDICATION Take 2 tablets by mouth 2 (two) times daily. Health food store digest enzymes tablets.      . potassium chloride SA (K-DUR,KLOR-CON) 20 MEQ tablet Take 1 tablet (20 mEq total) by mouth daily.  90 tablet  2  . predniSONE (DELTASONE) 5 MG tablet Take 5 mg by mouth every other day.        . ramipril (ALTACE) 5 MG capsule Take 1 capsule (5 mg total) by mouth daily.  90 capsule  2  . simvastatin (ZOCOR) 20 MG tablet Take 1 tablet (20 mg total) by mouth at bedtime.  90 tablet  2  . sotalol (BETAPACE) 120 MG tablet Take 1  tablet (120 mg total) by mouth 2 (two) times daily.  180 tablet  2   No current facility-administered medications for this visit.    Social History History   Social History  . Marital Status: Single    Spouse Name: N/A    Number of Children: N/A  . Years of Education: N/A   Occupational History  . Not on file.   Social History Main Topics  . Smoking status: Former Smoker -- 0.30 packs/day for 6 years    Types: Cigarettes    Quit date: 01/03/1982  . Smokeless tobacco: Never Used  . Alcohol Use: Yes     Comment: occasional  . Drug Use: No  . Sexual Activity: Not on file   Other Topics Concern  . Not on file   Social History Narrative  . No narrative on file     Review of Systems General: No chills, fever, night sweats or weight changes Cardiovascular: No chest pain, dyspnea on exertion, edema, orthopnea, palpitations, paroxysmal nocturnal dyspnea Dermatological: No rash, lesions or masses Respiratory: No cough, dyspnea Urologic: No hematuria, dysuria Abdominal: No nausea, vomiting, diarrhea, bright red blood per rectum, melena, or hematemesis Neurologic: No visual changes, weakness, changes in mental status All other systems reviewed and are otherwise negative except as noted above.  Physical Exam Vitals: Blood pressure 126/52, pulse 92, height 5\' 8"  (1.727 m), weight 187 lb (84.823 kg).  General: Well developed, well appearing 63 y.o. male in no acute distress. HEENT: Normocephalic, atraumatic. EOMs intact. Sclera nonicteric. Oropharynx clear.  Neck: Supple. No JVD. Lungs: Respirations regular and unlabored, CTA bilaterally. No wheezes, rales or rhonchi. Heart: RRR. S1, S2 present. No murmurs, rub, S3 or S4. Abdomen: Soft, non-distended. BS present x 4 quadrants. No hepatosplenomegaly.  Extremities: No clubbing, cyanosis or edema. DP/PT/Radials 2+ and equal bilaterally. Psych: Normal affect. Neuro: Alert and oriented X 3. Moves all extremities  spontaneously. Skin: ICD implant site is intact and well healed.   Diagnostics Device interrogation earlier this month reviewed - ICD battery at ERI, no VT/VF episodes.    Assessment and Plan  1. Ischemic CM s/p ICD implant   - battery at RRT since 12/28/2012 - discussed CRT upgrade as Dr. Caryl Comes recommended at last visit Nov 2014 - attempted upgrade to CRT-D back in 2009, unable to cannulate coronary sinus from right side but left subclavian vein was patent at that time - risks, benefits and alternatives to Bi-V ICD were discussed in detail today; these  risks include, but are not limited to, bleeding, infection, pneumothorax, perforation, tamponade, vascular damage, renal failure, lead dislodgement, MI, stroke and death; Mr. Creager expressed verbal understanding and agrees to proceed; this will be scheduled with Dr. Caryl Comes at the next available time - as required for NCDR ICD registry, will update echo   2. Chronic systolic HF - stable; euvolemic by exam - OptiVol stable - continue current regimen  3. Paroxysmal VT - stable without intercurrent ventricular arrhythmias - continue current regimen  3. Complete heart block - V paced >99%  4. CAD - stable without anginal symptoms - continue current regimen  Signed, Cerinity Zynda, PA-C 02/06/2013, 3:48 PM

## 2013-02-06 NOTE — Patient Instructions (Signed)
Your physician has requested that you have an echocardiogram. Echocardiography is a painless test that uses sound waves to create images of your heart. It provides your doctor with information about the size and shape of your heart and how well your heart's chambers and valves are working. This procedure takes approximately one hour. There are no restrictions for this procedure.   

## 2013-02-15 ENCOUNTER — Other Ambulatory Visit: Payer: Self-pay | Admitting: Internal Medicine

## 2013-02-18 ENCOUNTER — Other Ambulatory Visit: Payer: Self-pay | Admitting: Internal Medicine

## 2013-02-21 ENCOUNTER — Encounter: Payer: Self-pay | Admitting: Internal Medicine

## 2013-02-22 ENCOUNTER — Encounter (HOSPITAL_COMMUNITY): Payer: Self-pay | Admitting: Pharmacy Technician

## 2013-02-27 ENCOUNTER — Other Ambulatory Visit (HOSPITAL_COMMUNITY): Payer: Medicare Other

## 2013-02-27 ENCOUNTER — Other Ambulatory Visit: Payer: Medicare Other

## 2013-02-27 ENCOUNTER — Other Ambulatory Visit (INDEPENDENT_AMBULATORY_CARE_PROVIDER_SITE_OTHER): Payer: Medicare Other

## 2013-02-27 DIAGNOSIS — I428 Other cardiomyopathies: Secondary | ICD-10-CM

## 2013-02-27 DIAGNOSIS — I429 Cardiomyopathy, unspecified: Secondary | ICD-10-CM

## 2013-02-27 DIAGNOSIS — Z01810 Encounter for preprocedural cardiovascular examination: Secondary | ICD-10-CM

## 2013-02-27 LAB — CBC WITH DIFFERENTIAL/PLATELET
BASOS ABS: 0 10*3/uL (ref 0.0–0.1)
Basophils Relative: 0.8 % (ref 0.0–3.0)
Eosinophils Absolute: 0.2 10*3/uL (ref 0.0–0.7)
Eosinophils Relative: 3.8 % (ref 0.0–5.0)
HCT: 42 % (ref 39.0–52.0)
HEMOGLOBIN: 13.6 g/dL (ref 13.0–17.0)
LYMPHS PCT: 25.1 % (ref 12.0–46.0)
Lymphs Abs: 1.1 10*3/uL (ref 0.7–4.0)
MCHC: 32.3 g/dL (ref 30.0–36.0)
MCV: 94.4 fl (ref 78.0–100.0)
MONOS PCT: 13.1 % — AB (ref 3.0–12.0)
Monocytes Absolute: 0.6 10*3/uL (ref 0.1–1.0)
NEUTROS PCT: 57.2 % (ref 43.0–77.0)
Neutro Abs: 2.4 10*3/uL (ref 1.4–7.7)
Platelets: 187 10*3/uL (ref 150.0–400.0)
RBC: 4.45 Mil/uL (ref 4.22–5.81)
RDW: 13.5 % (ref 11.5–14.6)
WBC: 4.2 10*3/uL — ABNORMAL LOW (ref 4.5–10.5)

## 2013-02-27 LAB — BASIC METABOLIC PANEL
BUN: 14 mg/dL (ref 6–23)
CO2: 27 mEq/L (ref 19–32)
CREATININE: 1.1 mg/dL (ref 0.4–1.5)
Calcium: 8.9 mg/dL (ref 8.4–10.5)
Chloride: 106 mEq/L (ref 96–112)
GFR: 91.23 mL/min (ref >60.00)
Glucose, Bld: 94 mg/dL (ref 70–99)
Potassium: 3.9 mEq/L (ref 3.5–5.1)
Sodium: 137 mEq/L (ref 135–145)

## 2013-02-27 LAB — PROTIME-INR
INR: 1.2 ratio — AB (ref 0.8–1.0)
Prothrombin Time: 12.6 s — ABNORMAL HIGH (ref 10.2–12.4)

## 2013-03-01 ENCOUNTER — Ambulatory Visit (HOSPITAL_COMMUNITY): Admit: 2013-03-01 | Payer: Medicare Other | Admitting: Internal Medicine

## 2013-03-01 ENCOUNTER — Encounter (HOSPITAL_COMMUNITY): Payer: Self-pay

## 2013-03-01 SURGERY — BI-VENTRICULAR IMPLANTABLE CARDIOVERTER DEFIBRILLATOR UPGRADE
Anesthesia: LOCAL

## 2013-03-06 ENCOUNTER — Telehealth: Payer: Self-pay | Admitting: *Deleted

## 2013-03-06 ENCOUNTER — Other Ambulatory Visit: Payer: Self-pay | Admitting: *Deleted

## 2013-03-06 DIAGNOSIS — I5022 Chronic systolic (congestive) heart failure: Secondary | ICD-10-CM

## 2013-03-06 DIAGNOSIS — I255 Ischemic cardiomyopathy: Secondary | ICD-10-CM

## 2013-03-06 NOTE — Telephone Encounter (Signed)
Called patient to inform him of need for MUGA study and that office will be contacting him to make appointment. Pt agreeable to plan.

## 2013-03-13 ENCOUNTER — Encounter: Payer: Self-pay | Admitting: Cardiovascular Disease

## 2013-03-13 ENCOUNTER — Ambulatory Visit (HOSPITAL_COMMUNITY): Payer: Medicare Other | Attending: Cardiovascular Disease | Admitting: Radiology

## 2013-03-13 DIAGNOSIS — I5022 Chronic systolic (congestive) heart failure: Secondary | ICD-10-CM | POA: Insufficient documentation

## 2013-03-13 DIAGNOSIS — I43 Cardiomyopathy in diseases classified elsewhere: Secondary | ICD-10-CM

## 2013-03-13 DIAGNOSIS — I2589 Other forms of chronic ischemic heart disease: Secondary | ICD-10-CM | POA: Insufficient documentation

## 2013-03-13 DIAGNOSIS — I255 Ischemic cardiomyopathy: Secondary | ICD-10-CM

## 2013-03-13 DIAGNOSIS — Z01810 Encounter for preprocedural cardiovascular examination: Secondary | ICD-10-CM

## 2013-03-13 DIAGNOSIS — Z95810 Presence of automatic (implantable) cardiac defibrillator: Secondary | ICD-10-CM

## 2013-03-13 MED ORDER — TECHNETIUM TC 99M-LABELED RED BLOOD CELLS IV KIT
30.8000 | PACK | Freq: Once | INTRAVENOUS | Status: AC | PRN
  Administered 2013-03-13: 30.8 via INTRAVENOUS

## 2013-03-13 NOTE — Progress Notes (Signed)
Muga Study  Referring Provider:  Deboraha Sprang, MD  Date of Procedure: 03/13/2013  Indication: Evaluation for ischemia, and pending ICD Battery Replacement by Dr. Virl Axe History of ICM, AICD for CHB, VT, Stents, and 2013 Myocardial Perfusion Imaging-, EF=42% Symptom: DOE  IV 20G angiocath (R) AC x1, tolerated well. Irven Baltimore, RN  Muga Information:  The patient's red blood cells were labeled using the Ultra Tag method with 30.8 mci of Technetium 69m Pertechnetate.  The images were reconstructed in the Anterior, Lateral and Left Anterior oblique views.  Impression: The cine images and the quantitation analysis were carefully reviewed. After review, I decided to have the data reprocessed. Initially, the EF was computed at 35%. After reprocessing twice, the EF was 40%. I am most comfortable with the 40% result.  THEREFORE,  THE EF IS COMPUTED AS 40%.  Dola Argyle, MD

## 2013-03-19 ENCOUNTER — Other Ambulatory Visit (HOSPITAL_COMMUNITY): Payer: Medicare Other

## 2013-04-04 ENCOUNTER — Other Ambulatory Visit (INDEPENDENT_AMBULATORY_CARE_PROVIDER_SITE_OTHER): Payer: Medicare Other

## 2013-04-04 ENCOUNTER — Other Ambulatory Visit: Payer: Self-pay | Admitting: *Deleted

## 2013-04-04 ENCOUNTER — Ambulatory Visit (INDEPENDENT_AMBULATORY_CARE_PROVIDER_SITE_OTHER): Payer: Medicare Other | Admitting: *Deleted

## 2013-04-04 ENCOUNTER — Telehealth: Payer: Self-pay | Admitting: *Deleted

## 2013-04-04 ENCOUNTER — Encounter: Payer: Self-pay | Admitting: *Deleted

## 2013-04-04 DIAGNOSIS — Z01812 Encounter for preprocedural laboratory examination: Secondary | ICD-10-CM

## 2013-04-04 DIAGNOSIS — I4729 Other ventricular tachycardia: Secondary | ICD-10-CM

## 2013-04-04 DIAGNOSIS — I442 Atrioventricular block, complete: Secondary | ICD-10-CM

## 2013-04-04 DIAGNOSIS — I255 Ischemic cardiomyopathy: Secondary | ICD-10-CM

## 2013-04-04 DIAGNOSIS — I2589 Other forms of chronic ischemic heart disease: Secondary | ICD-10-CM

## 2013-04-04 DIAGNOSIS — I5042 Chronic combined systolic (congestive) and diastolic (congestive) heart failure: Secondary | ICD-10-CM

## 2013-04-04 DIAGNOSIS — I472 Ventricular tachycardia: Secondary | ICD-10-CM

## 2013-04-04 LAB — MDC_IDC_ENUM_SESS_TYPE_INCLINIC
Battery Voltage: 2.61 V
Brady Statistic AP VP Percent: 23.31 %
Brady Statistic AP VS Percent: 0 %
Brady Statistic AS VP Percent: 76.67 %
Brady Statistic RA Percent Paced: 23.31 %
Brady Statistic RV Percent Paced: 99.98 %
HIGH POWER IMPEDANCE MEASURED VALUE: 39 Ohm
HIGH POWER IMPEDANCE MEASURED VALUE: 45 Ohm
Lead Channel Impedance Value: 464 Ohm
Lead Channel Setting Pacing Amplitude: 2 V
Lead Channel Setting Pacing Pulse Width: 0.4 ms
MDC IDC MSMT LEADCHNL RA SENSING INTR AMPL: 1.3997
MDC IDC MSMT LEADCHNL RV IMPEDANCE VALUE: 368 Ohm
MDC IDC SESS DTM: 20150402134351
MDC IDC SET LEADCHNL RV PACING AMPLITUDE: 2.5 V
MDC IDC SET LEADCHNL RV SENSING SENSITIVITY: 0.3 mV
MDC IDC SET ZONE DETECTION INTERVAL: 250 ms
MDC IDC SET ZONE DETECTION INTERVAL: 300 ms
MDC IDC SET ZONE DETECTION INTERVAL: 350 ms
MDC IDC SET ZONE DETECTION INTERVAL: 380 ms
MDC IDC STAT BRADY AS VS PERCENT: 0.01 %
Zone Setting Detection Interval: 400 ms

## 2013-04-04 LAB — CBC WITH DIFFERENTIAL/PLATELET
BASOS ABS: 0 10*3/uL (ref 0.0–0.1)
BASOS PCT: 0.2 % (ref 0.0–3.0)
EOS ABS: 0.1 10*3/uL (ref 0.0–0.7)
Eosinophils Relative: 1.3 % (ref 0.0–5.0)
HCT: 38.4 % — ABNORMAL LOW (ref 39.0–52.0)
HEMOGLOBIN: 12.8 g/dL — AB (ref 13.0–17.0)
LYMPHS PCT: 7.4 % — AB (ref 12.0–46.0)
Lymphs Abs: 0.6 10*3/uL — ABNORMAL LOW (ref 0.7–4.0)
MCHC: 33.4 g/dL (ref 30.0–36.0)
MCV: 93.2 fl (ref 78.0–100.0)
MONOS PCT: 9.2 % (ref 3.0–12.0)
Monocytes Absolute: 0.8 10*3/uL (ref 0.1–1.0)
Neutro Abs: 6.8 10*3/uL (ref 1.4–7.7)
Neutrophils Relative %: 81.9 % — ABNORMAL HIGH (ref 43.0–77.0)
Platelets: 184 10*3/uL (ref 150.0–400.0)
RBC: 4.12 Mil/uL — AB (ref 4.22–5.81)
RDW: 13.5 % (ref 11.5–14.6)
WBC: 8.3 10*3/uL (ref 4.5–10.5)

## 2013-04-04 LAB — BASIC METABOLIC PANEL
BUN: 17 mg/dL (ref 6–23)
CALCIUM: 9 mg/dL (ref 8.4–10.5)
CO2: 26 mEq/L (ref 19–32)
Chloride: 102 mEq/L (ref 96–112)
Creatinine, Ser: 1 mg/dL (ref 0.4–1.5)
GFR: 93.25 mL/min (ref >60.00)
Glucose, Bld: 78 mg/dL (ref 70–99)
Potassium: 4.1 mEq/L (ref 3.5–5.1)
SODIUM: 136 meq/L (ref 135–145)

## 2013-04-04 NOTE — Progress Notes (Signed)
Interrogation only for battery longevity.  Battery 2.61v EOS.  Patient to be scheduled for change out.

## 2013-04-04 NOTE — Telephone Encounter (Signed)
Called pt to have him come into office to have his device battery checked and schedule his upgrade to [place] ICD. Pt will come in today about 1pm.

## 2013-04-04 NOTE — Patient Instructions (Signed)
Pt scheduled for CRT-D upgrade 4/8. Pre procedure labs today while in office. Letter on instructions given to patient. Wound check scheduled for 4/20

## 2013-04-09 ENCOUNTER — Encounter (HOSPITAL_COMMUNITY): Payer: Self-pay | Admitting: Pharmacy Technician

## 2013-04-09 DIAGNOSIS — I2589 Other forms of chronic ischemic heart disease: Secondary | ICD-10-CM | POA: Diagnosis not present

## 2013-04-09 DIAGNOSIS — I472 Ventricular tachycardia: Secondary | ICD-10-CM | POA: Diagnosis not present

## 2013-04-09 DIAGNOSIS — Z4502 Encounter for adjustment and management of automatic implantable cardiac defibrillator: Secondary | ICD-10-CM | POA: Diagnosis not present

## 2013-04-09 DIAGNOSIS — I2510 Atherosclerotic heart disease of native coronary artery without angina pectoris: Secondary | ICD-10-CM | POA: Diagnosis not present

## 2013-04-09 DIAGNOSIS — M549 Dorsalgia, unspecified: Secondary | ICD-10-CM | POA: Diagnosis not present

## 2013-04-09 DIAGNOSIS — Z9861 Coronary angioplasty status: Secondary | ICD-10-CM | POA: Diagnosis not present

## 2013-04-09 DIAGNOSIS — I5022 Chronic systolic (congestive) heart failure: Secondary | ICD-10-CM | POA: Diagnosis not present

## 2013-04-09 DIAGNOSIS — E785 Hyperlipidemia, unspecified: Secondary | ICD-10-CM | POA: Diagnosis not present

## 2013-04-09 DIAGNOSIS — G8929 Other chronic pain: Secondary | ICD-10-CM | POA: Diagnosis not present

## 2013-04-09 DIAGNOSIS — I509 Heart failure, unspecified: Secondary | ICD-10-CM | POA: Diagnosis not present

## 2013-04-09 DIAGNOSIS — I4729 Other ventricular tachycardia: Secondary | ICD-10-CM | POA: Diagnosis not present

## 2013-04-09 DIAGNOSIS — K219 Gastro-esophageal reflux disease without esophagitis: Secondary | ICD-10-CM | POA: Diagnosis not present

## 2013-04-09 MED ORDER — CEFAZOLIN SODIUM-DEXTROSE 2-3 GM-% IV SOLR: 2.0000 g | INTRAVENOUS | Status: DC

## 2013-04-09 MED ORDER — SODIUM CHLORIDE 0.9 % IR SOLN
80.0000 mg | Status: DC
  Filled 2013-04-09: qty 2

## 2013-04-10 ENCOUNTER — Ambulatory Visit (HOSPITAL_COMMUNITY)
Admission: RE | Admit: 2013-04-10 | Discharge: 2013-04-10 | Disposition: A | Discharge status: Home / Self Care | Payer: Medicare Other | Source: Ambulatory Visit | Attending: Internal Medicine | Admitting: Internal Medicine

## 2013-04-10 ENCOUNTER — Encounter (HOSPITAL_COMMUNITY)
Admission: RE | Disposition: A | Discharge status: Home / Self Care | Payer: Self-pay | Source: Ambulatory Visit | Attending: Internal Medicine

## 2013-04-10 DIAGNOSIS — I509 Heart failure, unspecified: Secondary | ICD-10-CM | POA: Insufficient documentation

## 2013-04-10 DIAGNOSIS — I5022 Chronic systolic (congestive) heart failure: Secondary | ICD-10-CM

## 2013-04-10 DIAGNOSIS — K219 Gastro-esophageal reflux disease without esophagitis: Secondary | ICD-10-CM | POA: Insufficient documentation

## 2013-04-10 DIAGNOSIS — I2510 Atherosclerotic heart disease of native coronary artery without angina pectoris: Secondary | ICD-10-CM | POA: Insufficient documentation

## 2013-04-10 DIAGNOSIS — I2589 Other forms of chronic ischemic heart disease: Secondary | ICD-10-CM | POA: Insufficient documentation

## 2013-04-10 DIAGNOSIS — I472 Ventricular tachycardia, unspecified: Secondary | ICD-10-CM | POA: Insufficient documentation

## 2013-04-10 DIAGNOSIS — I4729 Other ventricular tachycardia: Secondary | ICD-10-CM

## 2013-04-10 DIAGNOSIS — I442 Atrioventricular block, complete: Secondary | ICD-10-CM

## 2013-04-10 DIAGNOSIS — E785 Hyperlipidemia, unspecified: Secondary | ICD-10-CM | POA: Insufficient documentation

## 2013-04-10 DIAGNOSIS — Z95810 Presence of automatic (implantable) cardiac defibrillator: Secondary | ICD-10-CM

## 2013-04-10 DIAGNOSIS — I429 Cardiomyopathy, unspecified: Secondary | ICD-10-CM

## 2013-04-10 DIAGNOSIS — M549 Dorsalgia, unspecified: Secondary | ICD-10-CM | POA: Insufficient documentation

## 2013-04-10 DIAGNOSIS — Z9861 Coronary angioplasty status: Secondary | ICD-10-CM | POA: Insufficient documentation

## 2013-04-10 DIAGNOSIS — Z4502 Encounter for adjustment and management of automatic implantable cardiac defibrillator: Secondary | ICD-10-CM | POA: Diagnosis not present

## 2013-04-10 DIAGNOSIS — I255 Ischemic cardiomyopathy: Secondary | ICD-10-CM

## 2013-04-10 DIAGNOSIS — Z01810 Encounter for preprocedural cardiovascular examination: Secondary | ICD-10-CM

## 2013-04-10 DIAGNOSIS — G8929 Other chronic pain: Secondary | ICD-10-CM | POA: Insufficient documentation

## 2013-04-10 HISTORY — PX: BI-VENTRICULAR IMPLANTABLE CARDIOVERTER DEFIBRILLATOR UPGRADE: SHX5461

## 2013-04-10 LAB — SURGICAL PCR SCREEN
MRSA, PCR: NEGATIVE
Staphylococcus aureus: NEGATIVE

## 2013-04-10 SURGERY — BI-VENTRICULAR IMPLANTABLE CARDIOVERTER DEFIBRILLATOR UPGRADE
Anesthesia: LOCAL

## 2013-04-10 MED ORDER — CHLORHEXIDINE GLUCONATE 4 % EX LIQD
60.0000 mL | Freq: Once | CUTANEOUS | Status: DC
  Filled 2013-04-10: qty 60

## 2013-04-10 MED ORDER — SODIUM CHLORIDE 0.9 % IV SOLN: INTRAVENOUS | Status: DC

## 2013-04-10 MED ORDER — ACETAMINOPHEN 325 MG PO TABS
325.0000 mg | ORAL_TABLET | ORAL | Status: DC | PRN
  Filled 2013-04-10: qty 2

## 2013-04-10 MED ORDER — MIDAZOLAM HCL 5 MG/5ML IJ SOLN
INTRAMUSCULAR | Status: AC
  Filled 2013-04-10: qty 5

## 2013-04-10 MED ORDER — FENTANYL CITRATE 0.05 MG/ML IJ SOLN
INTRAMUSCULAR | Status: AC
  Filled 2013-04-10: qty 2

## 2013-04-10 MED ORDER — ONDANSETRON HCL 4 MG/2ML IJ SOLN: 4.0000 mg | Freq: Four times a day (QID) | INTRAMUSCULAR | Status: DC | PRN

## 2013-04-10 MED ORDER — MUPIROCIN 2 % EX OINT
TOPICAL_OINTMENT | Freq: Two times a day (BID) | CUTANEOUS | Status: DC
  Administered 2013-04-10: 10:00:00 via NASAL
  Filled 2013-04-10 (×2): qty 22

## 2013-04-10 MED ORDER — SODIUM CHLORIDE 0.9 % IV SOLN
INTRAVENOUS | Status: DC
  Administered 2013-04-10: 10:00:00 via INTRAVENOUS

## 2013-04-10 MED ORDER — SODIUM CHLORIDE 0.9 % IV SOLN: INTRAVENOUS | Status: AC

## 2013-04-10 NOTE — H&P (Signed)
Patient Care Team: Patricia Nettle, MD as PCP - General (Cardiology)   HPI  [name] is a 63 y.o. male Admitted for ICD generator replacement  He has hx of  ventricular tachycardia in the setting of ischemic and nonischemic heart disease.    2010 because of symptoms of progressive shortness of breath, he underwent evaluation including an echo demonstrating an ejection fraction of 40% or so and his Myoview scan demonstrated no ischemia. Repeat myoview scan 8/13 demonstrated infarct with peri-infarct ischemia.catheterization February 2014 demonstrated no obstructive coronary disease.   Exercise exercise intolerance so we undertook cardiopulmonary stress testingy which showed a max heart rate of 110 beats per minute. Rate response was improved and an interval visit in August symptoms were better  Previously implanted ICD was associated with failure to cannulate the coronary sinus from the right side .  It was anticipated with CHF symptoms and moderate depression of LVEF to implant CRT  This was rejected by his insurance company  Past Medical History  Diagnosis Date  . Chronic back pain     a. s/p multiple lumbar surgeries  . GERD (gastroesophageal reflux disease)   . Hyperlipidemia   . Myasthenia gravis   . Monomorphic ventricular tachycardia     a. s/p ICD 1997;   b. 06/2007 ICD upgrade to MDT Concerto Bi-V though LV lead unable to be placed.  . Complete heart block     a. h/o transient complete heart block in April 2009  . Sciatica   . DJD (degenerative joint disease)   . Ischemic cardiomyopathy     a. 05/2009 Echo: EF 40%, mild to mod glob HK, Gr 2 dd, mild LVH, Triv AI, Mild MR, PASP 37-34mmHg  . CAD (coronary artery disease)     a. s/p PCI/BMS of prox LAD in 1997;  b. 4/10 LHC: nonobs dzs. (pRCA 50-70%);  c.   Myoview 08/23/11 : Scar with peri-infarct ischemia infecting the entire septum with a corresponding wall motion abnormality, moderate risk scan, EF 42%.     Past Surgical History  Procedure Laterality Date  . Cardiac defibrillator placement  [place]  . Coronary angioplasty with stent placement      bare metal stenting of the LAD in 1997  . Generator change  07/1999  . Generator change  11/23/2006  . Anterior lumbar interbody fusion  04/23/2007    L4-L5   . Gallbladder surgery      Current Facility-Administered Medications  Medication Dose Route Frequency Provider Last Rate Last Dose  . 0.9 %  sodium chloride infusion   Intravenous Continuous Brooke O Edmisten, PA-C      . 0.9 %  sodium chloride infusion   Intravenous Continuous Azzie Roup Edmisten, PA-C 20 mL/hr at 04/10/13 [phone]    . ceFAZolin (ANCEF) IVPB 2 g/50 mL premix  2 g Intravenous On Call [employer], PA-C      . chlorhexidine (HIBICLENS) 4 % liquid 4 application  60 mL Topical Once [employer], PA-C      . gentamicin (GARAMYCIN) 80 mg in sodium chloride irrigation 0.9 % 500 mL irrigation  80 mg Irrigation On Call Deboraha Sprang, MD      . mupirocin ointment Drue Stager) 2 %   Nasal BID Deboraha Sprang, MD        No Active Allergies  Review of Systems negative except from HPI and PMH  Physical Exam BP 131/78  Pulse 87  Temp(Src) 97.8 F (36.6 C) (Oral)  Resp 18  Ht 5\' 8"  (1.727 m)  Wt 182 lb (82.555 kg)  BMI 27.68 kg/m2  SpO2 95% Well developed and well nourished in no acute distress HENT normal E scleral and icterus clear Neck Supple JVP flat; carotids brisk and full Clear to ausculation status post Regular rate and rhythm, no murmurs gallops or rub Soft with active bowel sounds No clubbing cyanosis  Edema Alert and oriented, grossly normal motor and sensory function Skin Warm and Dry  ECG  Sinus with P-synchronous/ AV  pacing   Assessment and  Plan Ischemic and nonischemic cardiomyopathy  VT  ICD  CHF chronic systolic  Mysathenia Gravis  Will proceed with ICD generator replacement without CRT upgrade as rejected by  insurance company  We have reviewed the benefits and risks of generator replacement.  These include but are not limited to lead fracture and infection.  The patient understands, agrees and is willing to proceed.

## 2013-04-10 NOTE — Interval H&P Note (Signed)
ICD Criteria  Current LVEF:43% ;Obtained < 1 month ago.  NYHA Functional Classification: Class II  Heart Failure History:  Yes, Duration of heart failure since onset is > 9 months  Non-Ischemic Dilated Cardiomyopathy History:  Yes, timeframe is > 9 months  Atrial Fibrillation/Atrial Flutter:  No.  Ventricular Tachycardia History:  Yes, Hemodynamic status unknown. VT Type:  SVT - Monomorphic.  Cardiac Arrest History:  No  History of Syndromes with Risk of Sudden Death:  No.  Previous ICD:  Yes, ICD Type:  Dual, Reason for ICD:  Primary prevention.  LVEF is not available  Electrophysiology Study: No.  Prior MI: No.  PPM: No.  OSA:  No  Patient Life Expectancy of >=1 year: Yes.  Anticoagulation Therapy:  Patient is NOT on anticoagulation therapy.   Beta Blocker Therapy:  Yes.   Ace Inhibitor/ARB Therapy:  Yes.History and Physical Interval Note:  04/10/2013 10:18 AM  [name]  has presented today for surgery, with the diagnosis of acm  The various methods of treatment have been discussed with the patient and family. After consideration of risks, benefits and other options for treatment, the patient has consented to  Procedure(s): BI-VENTRICULAR IMPLANTABLE CARDIOVERTER DEFIBRILLATOR UPGRADE (N/A) as a surgical intervention .  The patient's history has been reviewed, patient examined, no change in status, stable for surgery.  I have reviewed the patient's chart and labs.  Questions were answered to the patient's satisfaction.     Deboraha Sprang

## 2013-04-10 NOTE — CV Procedure (Signed)
Preoperative diagnosis  VT NICM/ICM with ICD at [facility] Postoperative diagnosis same/   Procedure: Generator replacement     Following informed consent the patient was brought to the electrophysiology laboratory in place of the fluoroscopic table in the supine position after routine prep and drape lidocaine was infiltrated in the region of the previous incision and carried down to later the device pocket using sharp dissection and electrocautery. The pocket was opened the device was freed up and was explanted.  Interrogation of the previously implanted ICD ventricular lead St Jude [id number]  demonstrated an R wave of NONE  millivolts., and impedance of 323 ohms, and a pacing threshold of 0.75 volts at 0.4 msec.  The previously implanted atrial lead Medtronic  demonstrated a P-wave amplitude of 2.1 milllivolts  and impedance of  399 ohms, and a pacing threshold of 1.25 volts at @ 0.73milliseconds.  The leads were inspected. Repair was not needed. The leads were then attached to a Medtronic  pulse generator, serial number [id number]   Through the device MEASUREMENT  Were as above High voltage impedances were 45/40 ohms  The pocket was irrigated with antibiotic containing saline solution hemostasis was assured and the leads and the device were placed in the pocket. The wound was then closed in 2 layers in normal fashion. Dermaobond dressing was applied  The patient tolerated the procedure without apparent complication.  DFT testing was not performed  Virl Axe   \

## 2013-04-10 NOTE — Discharge Instructions (Signed)
Pacemaker Battery Change, Care After  °Refer to this sheet in the next few weeks. These instructions provide you with information on caring for yourself after your procedure. Your health care provider may also give you more specific instructions. Your treatment has been planned according to current medical practices, but problems sometimes occur. Call your health care provider if you have any problems or questions after your procedure. °WHAT TO EXPECT AFTER THE PROCEDURE °After your procedure, it is typical to have the following sensations: °· Soreness at the pacemaker site. °HOME CARE INSTRUCTIONS  °· Keep the incision clean and dry. °· Unless advised otherwise, you may shower beginning 48 hours after your procedure. °· For the first week after the replacement, avoid stretching motions that pull at the incision site and avoid heavy exercise with the arm on the same side as the incision. °· Only take over-the-counter or prescription medicines for pain, discomfort, or fever as directed by your health care provider. °· Your health care provider will tell you when you will need to next test your pacemaker by telephone or when to return to the office for follow up for removal of stitches. °SEEK MEDICAL CARE IF:  °· You have pain at the incision site that is not relieved by over-the-counter or prescription medicine. °· There is drainage or pus from the incision site. °· There is swelling larger than a lime at the incision site. °· You develop red streaking that extends above or below the incision site. °· You feel brief, intermittent palpitations, lightheadedness, or any symptoms that you feel might be related to your heart. °SEEK IMMEDIATE MEDICAL CARE IF:  °· You experience chest pain that is different than the pain at the pacemaker site. °· Shortness of breath. °· Palpitations or irregular heart beat. °· Lightheadedness that does not go away quickly. °· Fainting. °· You have pain that gets worse and is not relieved by  medicine. °MAKE SURE YOU:  °· Understand these instructions. °· Will watch your condition. °· Will get help right away if you are not doing well or get worse. °Document Released: 10/10/2012 Document Reviewed: 07/04/2012 °[employer]® Patient Information ©2014 [employer]. ° °

## 2013-04-11 ENCOUNTER — Encounter: Payer: Self-pay | Admitting: Internal Medicine

## 2013-04-16 ENCOUNTER — Encounter (HOSPITAL_COMMUNITY): Payer: Self-pay | Admitting: *Deleted

## 2013-04-17 ENCOUNTER — Ambulatory Visit (INDEPENDENT_AMBULATORY_CARE_PROVIDER_SITE_OTHER): Payer: Self-pay

## 2013-04-17 DIAGNOSIS — I255 Ischemic cardiomyopathy: Secondary | ICD-10-CM

## 2013-04-17 DIAGNOSIS — I442 Atrioventricular block, complete: Secondary | ICD-10-CM

## 2013-04-17 DIAGNOSIS — I472 Ventricular tachycardia: Secondary | ICD-10-CM

## 2013-04-17 DIAGNOSIS — I4729 Other ventricular tachycardia: Secondary | ICD-10-CM

## 2013-04-17 LAB — MDC_IDC_ENUM_SESS_TYPE_INCLINIC
Brady Statistic AP VP Percent: 3.6 %
Brady Statistic AS VP Percent: 96.3 %
Lead Channel Pacing Threshold Amplitude: 0.625 V
Lead Channel Pacing Threshold Pulse Width: 0.4 ms
Lead Channel Pacing Threshold Pulse Width: 0.4 ms
Lead Channel Setting Pacing Amplitude: 2.5 V
Lead Channel Setting Pacing Amplitude: 2.5 V
Lead Channel Setting Sensing Sensitivity: 0.3 mV
MDC IDC MSMT LEADCHNL LV IMPEDANCE VALUE: 3000 Ohm — AB
MDC IDC MSMT LEADCHNL RA IMPEDANCE VALUE: 399 Ohm
MDC IDC MSMT LEADCHNL RA PACING THRESHOLD AMPLITUDE: 1.5 V
MDC IDC MSMT LEADCHNL RA SENSING INTR AMPL: 1.9 mV
MDC IDC MSMT LEADCHNL RV IMPEDANCE VALUE: 380 Ohm
MDC IDC SET LEADCHNL RV PACING PULSEWIDTH: 0.4 ms
MDC IDC SET ZONE DETECTION INTERVAL: 250 ms
MDC IDC SET ZONE DETECTION INTERVAL: 300 ms
MDC IDC STAT BRADY AP VS PERCENT: 0.1 % — AB
MDC IDC STAT BRADY AS VS PERCENT: 0.1 %
Zone Setting Detection Interval: 350 ms
Zone Setting Detection Interval: 360 ms
Zone Setting Detection Interval: 380 ms

## 2013-04-22 ENCOUNTER — Ambulatory Visit (INDEPENDENT_AMBULATORY_CARE_PROVIDER_SITE_OTHER): Payer: Medicare Other | Admitting: *Deleted

## 2013-04-22 ENCOUNTER — Telehealth: Payer: Self-pay | Admitting: *Deleted

## 2013-04-22 DIAGNOSIS — I472 Ventricular tachycardia, unspecified: Secondary | ICD-10-CM

## 2013-04-22 DIAGNOSIS — E785 Hyperlipidemia, unspecified: Secondary | ICD-10-CM | POA: Insufficient documentation

## 2013-04-22 DIAGNOSIS — I5042 Chronic combined systolic (congestive) and diastolic (congestive) heart failure: Secondary | ICD-10-CM

## 2013-04-22 DIAGNOSIS — I442 Atrioventricular block, complete: Secondary | ICD-10-CM

## 2013-04-22 DIAGNOSIS — I4729 Other ventricular tachycardia: Secondary | ICD-10-CM | POA: Diagnosis not present

## 2013-04-22 DIAGNOSIS — D4989 Neoplasm of unspecified behavior of other specified sites: Secondary | ICD-10-CM

## 2013-04-22 DIAGNOSIS — I2589 Other forms of chronic ischemic heart disease: Secondary | ICD-10-CM | POA: Diagnosis not present

## 2013-04-22 DIAGNOSIS — I509 Heart failure, unspecified: Secondary | ICD-10-CM

## 2013-04-22 DIAGNOSIS — G7000 Myasthenia gravis without (acute) exacerbation: Secondary | ICD-10-CM

## 2013-04-22 DIAGNOSIS — D150 Benign neoplasm of thymus: Secondary | ICD-10-CM

## 2013-04-22 DIAGNOSIS — Z95810 Presence of automatic (implantable) cardiac defibrillator: Secondary | ICD-10-CM

## 2013-04-22 HISTORY — DX: Myasthenia gravis without (acute) exacerbation: G70.00

## 2013-04-22 HISTORY — DX: Neoplasm of unspecified behavior of other specified sites: D49.89

## 2013-04-22 HISTORY — DX: Presence of automatic (implantable) cardiac defibrillator: Z95.810

## 2013-04-22 HISTORY — DX: Heart failure, unspecified: I50.9

## 2013-04-22 LAB — MDC_IDC_ENUM_SESS_TYPE_INCLINIC
Battery Voltage: 3.11 V
Brady Statistic AP VP Percent: 7.09 %
Brady Statistic AS VP Percent: 92.81 %
Brady Statistic RA Percent Paced: 7.09 %
Brady Statistic RV Percent Paced: 99.78 %
HIGH POWER IMPEDANCE MEASURED VALUE: 190 Ohm
HIGH POWER IMPEDANCE MEASURED VALUE: 40 Ohm
HIGH POWER IMPEDANCE MEASURED VALUE: 49 Ohm
Lead Channel Impedance Value: 342 Ohm
Lead Channel Impedance Value: 437 Ohm
Lead Channel Pacing Threshold Amplitude: 0.625 V
Lead Channel Pacing Threshold Amplitude: 1.25 V
Lead Channel Pacing Threshold Amplitude: 1.75 V
Lead Channel Pacing Threshold Pulse Width: 0.4 ms
Lead Channel Pacing Threshold Pulse Width: 0.8 ms
Lead Channel Setting Pacing Amplitude: 2.5 V
Lead Channel Setting Pacing Pulse Width: 0.4 ms
Lead Channel Setting Sensing Sensitivity: 0.3 mV
MDC IDC MSMT BATTERY REMAINING LONGEVITY: 100 mo
MDC IDC MSMT LEADCHNL RA SENSING INTR AMPL: 2.375 mV
MDC IDC MSMT LEADCHNL RA SENSING INTR AMPL: 2.5 mV
MDC IDC MSMT LEADCHNL RV PACING THRESHOLD PULSEWIDTH: 0.4 ms
MDC IDC SESS DTM: 20150420105635
MDC IDC SET LEADCHNL RV PACING AMPLITUDE: 2.5 V
MDC IDC SET ZONE DETECTION INTERVAL: 350 ms
MDC IDC STAT BRADY AP VS PERCENT: 0 %
MDC IDC STAT BRADY AS VS PERCENT: 0.1 %
Zone Setting Detection Interval: 250 ms
Zone Setting Detection Interval: 300 ms
Zone Setting Detection Interval: 360 ms
Zone Setting Detection Interval: 380 ms

## 2013-04-22 NOTE — Progress Notes (Signed)
Wound check appointment. Steri-strips removed. Wound without redness or edema. Incision edges approximated, wound well healed. Normal device function. Thresholds, sensing, and impedances consistent with implant measurements. Device programmed at appropriate safety margins. Histogram distribution appropriate for patient and level of activity. No mode switches or ventricular arrhythmias noted. Patient educated about wound care, arm mobility, lifting restrictions, shock plan. ROV in 3 months with SK. 

## 2013-04-22 NOTE — Telephone Encounter (Signed)
Faxed clearance for excision of cervical lipoma, along with last OV w/ Ileene Hutchinson, PA-C.

## 2013-04-25 NOTE — Progress Notes (Signed)
CareAlert transmission. Patient checked in the office per Melton Alar, LPN. Device function WNL. Alert due to >3000 LV lead impedance (no LV in lead in LV port). Patient to set up Carelink monitor and F/U as scheduled.

## 2013-05-02 ENCOUNTER — Encounter: Payer: Self-pay | Admitting: *Deleted

## 2013-05-17 ENCOUNTER — Encounter: Payer: Self-pay | Admitting: Internal Medicine

## 2013-05-21 ENCOUNTER — Encounter: Payer: Self-pay | Admitting: Internal Medicine

## 2013-06-03 ENCOUNTER — Other Ambulatory Visit: Payer: Self-pay | Admitting: Internal Medicine

## 2013-07-01 ENCOUNTER — Other Ambulatory Visit: Payer: Self-pay | Admitting: Internal Medicine

## 2013-08-16 ENCOUNTER — Encounter: Payer: Self-pay | Admitting: *Deleted

## 2013-08-17 ENCOUNTER — Encounter (HOSPITAL_COMMUNITY): Payer: Self-pay | Admitting: Emergency Medicine

## 2013-08-17 ENCOUNTER — Emergency Department (HOSPITAL_COMMUNITY): Payer: Medicare Other

## 2013-08-17 ENCOUNTER — Emergency Department (HOSPITAL_COMMUNITY)
Admission: EM | Admit: 2013-08-17 | Discharge: 2013-08-17 | Disposition: A | Discharge status: Home / Self Care | Payer: Medicare Other | Attending: Internal Medicine | Admitting: Internal Medicine

## 2013-08-17 DIAGNOSIS — Z8739 Personal history of other diseases of the musculoskeletal system and connective tissue: Secondary | ICD-10-CM | POA: Diagnosis not present

## 2013-08-17 DIAGNOSIS — I2510 Atherosclerotic heart disease of native coronary artery without angina pectoris: Secondary | ICD-10-CM | POA: Insufficient documentation

## 2013-08-17 DIAGNOSIS — Z95810 Presence of automatic (implantable) cardiac defibrillator: Secondary | ICD-10-CM

## 2013-08-17 DIAGNOSIS — K219 Gastro-esophageal reflux disease without esophagitis: Secondary | ICD-10-CM | POA: Insufficient documentation

## 2013-08-17 DIAGNOSIS — Z7982 Long term (current) use of aspirin: Secondary | ICD-10-CM | POA: Insufficient documentation

## 2013-08-17 DIAGNOSIS — Z9861 Coronary angioplasty status: Secondary | ICD-10-CM | POA: Insufficient documentation

## 2013-08-17 DIAGNOSIS — G8929 Other chronic pain: Secondary | ICD-10-CM | POA: Diagnosis not present

## 2013-08-17 DIAGNOSIS — Z8669 Personal history of other diseases of the nervous system and sense organs: Secondary | ICD-10-CM | POA: Diagnosis not present

## 2013-08-17 DIAGNOSIS — R079 Chest pain, unspecified: Secondary | ICD-10-CM | POA: Insufficient documentation

## 2013-08-17 DIAGNOSIS — I442 Atrioventricular block, complete: Secondary | ICD-10-CM

## 2013-08-17 DIAGNOSIS — Z79899 Other long term (current) drug therapy: Secondary | ICD-10-CM | POA: Insufficient documentation

## 2013-08-17 DIAGNOSIS — E785 Hyperlipidemia, unspecified: Secondary | ICD-10-CM | POA: Insufficient documentation

## 2013-08-17 DIAGNOSIS — I5042 Chronic combined systolic (congestive) and diastolic (congestive) heart failure: Secondary | ICD-10-CM

## 2013-08-17 LAB — CBC
HCT: 38.3 % — ABNORMAL LOW (ref 39.0–52.0)
HEMOGLOBIN: 12.9 g/dL — AB (ref 13.0–17.0)
MCH: 31.2 pg (ref 26.0–34.0)
MCHC: 33.7 g/dL (ref 30.0–36.0)
MCV: 92.7 fL (ref 78.0–100.0)
PLATELETS: 193 10*3/uL (ref 150–400)
RBC: 4.13 MIL/uL — ABNORMAL LOW (ref 4.22–5.81)
RDW: 12.3 % (ref 11.5–15.5)
WBC: 4.5 10*3/uL (ref 4.0–10.5)

## 2013-08-17 LAB — URINALYSIS, ROUTINE W REFLEX MICROSCOPIC
Bilirubin Urine: NEGATIVE
Glucose, UA: NEGATIVE mg/dL
HGB URINE DIPSTICK: NEGATIVE
Ketones, ur: NEGATIVE mg/dL
Leukocytes, UA: NEGATIVE
Nitrite: NEGATIVE
PH: 8 (ref 5.0–8.0)
Protein, ur: NEGATIVE mg/dL
SPECIFIC GRAVITY, URINE: 1.012 (ref 1.005–1.030)
UROBILINOGEN UA: 1 mg/dL (ref 0.0–1.0)

## 2013-08-17 LAB — BASIC METABOLIC PANEL
ANION GAP: 7 (ref 5–15)
BUN: 13 mg/dL (ref 6–23)
CALCIUM: 8.9 mg/dL (ref 8.4–10.5)
CHLORIDE: 101 meq/L (ref 96–112)
CO2: 29 meq/L (ref 19–32)
Creatinine, Ser: 1.09 mg/dL (ref 0.50–1.35)
GFR calc Af Amer: 80 mL/min — ABNORMAL LOW (ref >90)
GFR calc non Af Amer: 69 mL/min — ABNORMAL LOW (ref >90)
Glucose, Bld: 88 mg/dL (ref 70–99)
POTASSIUM: 4.4 meq/L (ref 3.7–5.3)
SODIUM: 137 meq/L (ref 137–147)

## 2013-08-17 LAB — I-STAT TROPONIN, ED
TROPONIN I, POC: 0 ng/mL (ref 0.00–0.08)
TROPONIN I, POC: 0 ng/mL (ref 0.00–0.08)

## 2013-08-17 LAB — PRO B NATRIURETIC PEPTIDE: PRO B NATRI PEPTIDE: 198.7 pg/mL — AB (ref 0–125)

## 2013-08-17 MED ORDER — MORPHINE SULFATE 4 MG/ML IJ SOLN: 4.0000 mg | Freq: Once | INTRAMUSCULAR | Status: DC

## 2013-08-17 MED ORDER — ASPIRIN 81 MG PO CHEW
162.0000 mg | CHEWABLE_TABLET | Freq: Once | ORAL | Status: AC
  Administered 2013-08-17: 162 mg via ORAL
  Filled 2013-08-17: qty 2

## 2013-08-17 MED ORDER — ONDANSETRON HCL 4 MG/2ML IJ SOLN: 4.0000 mg | Freq: Once | INTRAMUSCULAR | Status: DC

## 2013-08-17 NOTE — ED Provider Notes (Signed)
CSN: [id number]     Arrival date & time 08/17/13  1509 History   First MD Initiated Contact with Patient 08/17/13 1530     Chief Complaint  Patient presents with  . Chest Pain     (Consider location/radiation/quality/duration/timing/severity/associated sxs/prior Treatment) HPI Comments: Patient is a 63 year old male with history of cardiomyopathy, CAD, hyperlipidemia, myasthenia gravis, complete heart block, monomorphic ventricular tachycardia who presents to the emergency department today for evaluation of chest pain since yesterday. His chest pain is an aching pain in his left chest. He has some associated shortness of breath and nausea. He has used NSAIDs and a heating pad without improvement of his symptoms. He denies aggravating/alleviating factors including activity and rest. Currently his pain feels improved. No vomiting, diaphoresis. He is very concerned that his pacemaker has stopped working.      The history is provided by the patient. No language interpreter was used.    Past Medical History  Diagnosis Date  . Chronic back pain     a. s/p multiple lumbar surgeries  . GERD (gastroesophageal reflux disease)   . Hyperlipidemia   . Myasthenia gravis   . Monomorphic ventricular tachycardia     a. s/p ICD 1997;   b. 06/2007 ICD upgrade to MDT Concerto Bi-V though LV lead unable to be placed.  . Complete heart block     a. h/o transient complete heart block in April 2009  . Sciatica   . DJD (degenerative joint disease)   . Ischemic cardiomyopathy     a. 05/2009 Echo: EF 40%, mild to mod glob HK, Gr 2 dd, mild LVH, Triv AI, Mild MR, PASP 37-41mmHg  . CAD (coronary artery disease)     a. s/p PCI/BMS of prox LAD in 1997;  b. 4/10 LHC: nonobs dzs. (pRCA 50-70%);  c.   Myoview 08/23/11 : Scar with peri-infarct ischemia infecting the entire septum with a corresponding wall motion abnormality, moderate risk scan, EF 42%.   Past Surgical History  Procedure Laterality Date  . Cardiac  defibrillator placement  2001; 2008    single chamber MDT ICD implanted for secondary prevention; gen change 2008; explantation 2009  . Coronary angioplasty with stent placement      bare metal stenting of the LAD in 1997  . Anterior lumbar interbody fusion  04/23/2007    L4-L5   . Gallbladder surgery    . Implantable cardioverter defibrillator implant  2009; 2015    MDT dual chamber right sided device implanted 2009 with gen change 2015; unable to place LV lead   Family History  Problem Relation Age of Onset  . Coronary artery disease Brother    History  Substance Use Topics  . Smoking status: Former Smoker -- 0.30 packs/day for 6 years    Types: Cigarettes    Quit date: 01/03/1982  . Smokeless tobacco: Never Used  . Alcohol Use: Yes     Comment: occasional    Review of Systems  Constitutional: Negative for fever, chills and diaphoresis.  Respiratory: Positive for shortness of breath.   Cardiovascular: Positive for chest pain.  Gastrointestinal: Positive for nausea. Negative for vomiting, abdominal pain and diarrhea.  All other systems reviewed and are negative.     Allergies  Review of patient's allergies indicates no known allergies.  Home Medications   Prior to Admission medications   Medication Sig Start Date End Date Taking? Authorizing Provider  aspirin EC 81 MG tablet Take 1 tablet (81 mg total) by mouth  daily. 01/25/11   Deboraha Sprang, MD  azaTHIOprine (IMURAN) 50 MG tablet Take 150 mg by mouth daily.     Historical Provider, MD  furosemide (LASIX) 40 MG tablet Take 60 mg by mouth daily.    Historical Provider, MD  furosemide (LASIX) 40 MG tablet TAKE ONE & ONE-HALF TABLETS BY MOUTH ONCE DAILY    Deboraha Sprang, MD  HYDROcodone-acetaminophen (NORCO) 7.5-325 MG per tablet Take 1 tablet by mouth as needed.     Historical Provider, MD  ipratropium (ATROVENT) 0.03 % nasal spray PLACE ONE TO TWO SPRAY EACH NOSTRIL TWICE DAILY AS NEEDED 07/01/13   Deneise Lever, MD   isosorbide-hydrALAZINE (BIDIL) 20-37.5 MG per tablet Take 1 tablet by mouth 2 (two) times daily.    Historical Provider, MD  magnesium oxide (MAG-OX) 400 MG tablet Take 400 mg by mouth 2 (two) times daily.    Historical Provider, MD  Nutritional Supplements (GRAPESEED EXTRACT PO) Take 2 capsules by mouth daily.    Historical Provider, MD  omeprazole (PRILOSEC) 20 MG capsule Take 20 mg by mouth daily.    Historical Provider, MD  OVER THE COUNTER MEDICATION Take 1-2 tablets by mouth daily. "blue green algae"    Historical Provider, MD  OVER THE COUNTER MEDICATION Take 2 tablets by mouth 2 (two) times daily. Health food store digest enzymes tablets.    Historical Provider, MD  potassium chloride SA (K-DUR,KLOR-CON) 20 MEQ tablet Take 20 mEq by mouth daily.    Historical Provider, MD  predniSONE (DELTASONE) 5 MG tablet Take 5 mg by mouth every other day.      Historical Provider, MD  ramipril (ALTACE) 5 MG capsule Take 1 capsule (5 mg total) by mouth daily. 03/07/12   Deboraha Sprang, MD  simvastatin (ZOCOR) 20 MG tablet Take 20 mg by mouth daily.    Historical Provider, MD  simvastatin (ZOCOR) 20 MG tablet TAKE ONE TABLET BY MOUTH ONCE DAILY AT BEDTIME    Deboraha Sprang, MD  sotalol (BETAPACE) 120 MG tablet TAKE ONE TABLET BY MOUTH TWICE DAILY    Deboraha Sprang, MD   BP 110/69  Pulse 74  Resp 18  SpO2 97% Physical Exam  Nursing note and vitals reviewed. Constitutional: He is oriented to person, place, and time. He appears well-developed and well-nourished. No distress.  HENT:  Head: Normocephalic and atraumatic.  Right Ear: External ear normal.  Left Ear: External ear normal.  Nose: Nose normal.  Eyes: Conjunctivae are normal.  Neck: Normal range of motion. No tracheal deviation present.  Cardiovascular: Normal rate, regular rhythm, normal heart sounds, intact distal pulses and normal pulses.   Pulses:      Radial pulses are 2+ on the right side, and 2+ on the left side.       Posterior  tibial pulses are 2+ on the right side, and 2+ on the left side.  Pulmonary/Chest: Effort normal and breath sounds normal. No stridor.  Abdominal: Soft. He exhibits no distension. There is no tenderness.  Musculoskeletal: Normal range of motion.  Neurological: He is alert and oriented to person, place, and time.  Skin: Skin is warm and dry. He is not diaphoretic.  Psychiatric: He has a normal mood and affect. His behavior is normal.    ED Course  Procedures (including critical care time) Labs Review Labs Reviewed  CBC - Abnormal; Notable for the following:    RBC 4.13 (*)    Hemoglobin 12.9 (*)    HCT  38.3 (*)    All other components within normal limits  BASIC METABOLIC PANEL - Abnormal; Notable for the following:    GFR calc non Af Amer 69 (*)    GFR calc Af Amer 80 (*)    All other components within normal limits  PRO B NATRIURETIC PEPTIDE - Abnormal; Notable for the following:    Pro B Natriuretic peptide (BNP) 198.7 (*)    All other components within normal limits  URINE CULTURE  URINALYSIS, ROUTINE W REFLEX MICROSCOPIC  I-STAT TROPOININ, ED  Randolm Idol, ED    Imaging Review Dg Chest 2 View  08/17/2013   CLINICAL DATA:  Chest heaviness on the left.  EXAM: CHEST  2 VIEW  COMPARISON:  PA and lateral chest 01/16/2009.  FINDINGS: There is unchanged elevation of the left hemidiaphragm. Lungs are clear. Heart size is normal. AICD is in place. Leads from prior AICD on the left noted, unchanged.  IMPRESSION: No acute disease.  Stable compared to prior exam.   Electronically Signed   By: Inge Rise M.D.   On: 08/17/2013 16:07     EKG Interpretation   Date/Time:  Saturday August 17 2013 15:16:43 EDT Ventricular Rate:  79 PR Interval:  134 QRS Duration: 196 QT Interval:  486 QTC Calculation: 557 R Axis:   -4 Text Interpretation:  AV dual-paced rhythm Abnormal ECG When compared with  ECG of 04/10/2013 No significant change was found Confirmed by [facility]  MD,   Nunzio Cory [phone]) on 08/17/2013 5:43:36 PM      MDM   Final diagnoses:  Chest pain, unspecified chest pain type  ICD (implantable cardioverter-defibrillator) in place    Patient presents to ED with chest pain. Labs and imaging are unremarkable. EKG shows AV paced rhythm. Given significant cardiac history, cardiology was consulted and evaluated patient in ED. Patient is appropriate for discharge home with outpatient follow up. Consultation is appreciated. Discussed reasons to return to ED immediately. Vital signs stable for discharge. Discussed case with Dr. Thurnell Garbe who agrees with plan. Patient / Family / Caregiver informed of clinical course, understand medical decision-making process, and agree with plan.     Elwyn Lade, PA-C 08/26/13 [place], DO 11/08/13 1151

## 2013-08-17 NOTE — ED Notes (Signed)
No complaintrs

## 2013-08-17 NOTE — ED Notes (Signed)
Pt reports central chest heaviness since yesterday with SOB, weakness and nausea. States "since yesterday I just haven't felt well." Pt in NAD. AO x4.

## 2013-08-17 NOTE — Consult Note (Signed)
Reason for Consult: chest pains Referring Physician: [name] is an 63 y.o. male.  HPI:  [name] is a pleasant 63 year old male with a history of cardiomyopathy. He has a history of coronary artery disease and had a bare metal stent to the LAD in the lat 1990's. He has a reduced EF and has an ICD. He also developed heart block and now is predominantly A-V paced. He presents today with over 24 hours of chest pain on the left side. The pain is described as an ache across the left breast area. He has tried NSAIDS and a heating pad without improvement. There is no worsening of the pain with exertion. The pain is not relieved by rest. It has lasted for almost 1 day at this point. The pain has now resolved without intervention. He also noted that his blood presssure was very high this morning. It has since returned to normal. 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 Here in the ED his lab work has been normal, with normal cardiac biomarkers. His ECg shows A-V paced rhythm with CRT.   Past Medical History  Diagnosis Date  . Chronic back pain     a. s/p multiple lumbar surgeries  . GERD (gastroesophageal reflux disease)   . Hyperlipidemia   . Myasthenia gravis   . Monomorphic ventricular tachycardia     a. s/p ICD 1997;   b. 06/2007 ICD upgrade to MDT Concerto Bi-V though LV lead unable to be placed.  . Complete heart block     a. h/o transient complete heart block in April 2009  . Sciatica   . DJD (degenerative joint disease)   . Ischemic cardiomyopathy     a. 05/2009 Echo: EF 40%, mild to mod glob HK, Gr 2 dd, mild LVH, Triv AI, Mild [name], PASP 37-29mmHg  . CAD (coronary artery disease)     a. s/p PCI/BMS of prox LAD in 1997;  b. 4/10 LHC: nonobs dzs. (pRCA 50-70%);  c.   Myoview 08/23/11 : Scar with peri-infarct ischemia infecting the entire septum with a corresponding wall motion abnormality,  moderate risk scan, EF 42%.    Past Surgical History  Procedure Laterality Date  . Cardiac defibrillator placement  2001; 2008    single chamber MDT ICD implanted for secondary prevention; gen change 2008; explantation 2009  . Coronary angioplasty with stent placement      bare metal stenting of the LAD in 1997  . Anterior lumbar interbody fusion  04/23/2007    L4-L5   . Gallbladder surgery    . Implantable cardioverter defibrillator implant  2009; 2015    MDT dual chamber right sided device implanted 2009 with gen change 2015; unable to place LV lead    Family History  Problem Relation Age of Onset  . Coronary artery disease Brother     Social History:  reports that he quit smoking about 31 years ago. His smoking use included Cigarettes. He has a 1.8 pack-year smoking history. He has never used smokeless tobacco. He reports that he drinks alcohol. He reports that he does not use illicit drugs.  Allergies:  Allergies  Allergen Reactions  . Procainamide Other (See Comments)    Allergy is from [facility] records - pt is not aware of any reaction to this medicine  . Quinidine Other (See Comments)    Allergy is from  [facility] records - pt is not aware of any reaction to this medicine    Medications: I have reviewed the patient's current medications.  Results for orders placed during the hospital encounter of 08/17/13 (from the past 48 hour(s))  CBC     Status: Abnormal   Collection Time    08/17/13  3:19 PM      Result Value Ref Range   WBC 4.5  4.0 - 10.5 K/uL   RBC 4.13 (*) 4.22 - 5.81 MIL/uL   Hemoglobin 12.9 (*) 13.0 - 17.0 g/dL   HCT 38.3 (*) 39.0 - 52.0 %   MCV 92.7  78.0 - 100.0 fL   MCH 31.2  26.0 - 34.0 pg   MCHC 33.7  30.0 - 36.0 g/dL   RDW 12.3  11.5 - 15.5 %   Platelets 193  150 - 400 K/uL  BASIC METABOLIC PANEL     Status: Abnormal   Collection Time    08/17/13  3:19 PM      Result Value Ref Range   Sodium 137  137 - 147 mEq/L   Potassium 4.4  3.7  - 5.3 mEq/L   Chloride 101  96 - 112 mEq/L   CO2 29  19 - 32 mEq/L   Glucose, Bld 88  70 - 99 mg/dL   BUN 13  6 - 23 mg/dL   Creatinine, Ser 1.09  0.50 - 1.35 mg/dL   Calcium 8.9  8.4 - 10.5 mg/dL   GFR calc non Af Amer 69 (*) >90 mL/min   GFR calc Af Amer 80 (*) >90 mL/min   Comment: (NOTE)     The eGFR has been calculated using the CKD EPI equation.     This calculation has not been validated in all clinical situations.     eGFR's persistently <90 mL/min signify possible Chronic Kidney     Disease.   Anion gap 7  5 - 15  PRO B NATRIURETIC PEPTIDE     Status: Abnormal   Collection Time    08/17/13  3:20 PM      Result Value Ref Range   Pro B Natriuretic peptide (BNP) 198.7 (*) 0 - 125 pg/mL  I-STAT TROPOININ, ED     Status: None   Collection Time    08/17/13  3:26 PM      Result Value Ref Range   Troponin i, poc 0.00  0.00 - 0.08 ng/mL   Comment 3            Comment: Due to the release kinetics of cTnI,     a negative result within the first hours     of the onset of symptoms does not rule out     myocardial infarction with certainty.     If myocardial infarction is still suspected,     repeat the test at appropriate intervals.  URINALYSIS, ROUTINE W REFLEX MICROSCOPIC     Status: None   Collection Time    08/17/13  5:02 PM      Result Value Ref Range   Color, Urine YELLOW  YELLOW   APPearance CLEAR  CLEAR   Specific Gravity, Urine 1.012  1.005 - 1.030   pH 8.0  5.0 - 8.0   Glucose, UA NEGATIVE  NEGATIVE mg/dL   Hgb urine dipstick NEGATIVE  NEGATIVE   Bilirubin Urine NEGATIVE  NEGATIVE   Ketones, ur NEGATIVE  NEGATIVE mg/dL   Protein, ur NEGATIVE  NEGATIVE mg/dL   Urobilinogen, UA  1.0  0.0 - 1.0 mg/dL   Nitrite NEGATIVE  NEGATIVE   Leukocytes, UA NEGATIVE  NEGATIVE   Comment: MICROSCOPIC NOT DONE ON URINES WITH NEGATIVE PROTEIN, BLOOD, LEUKOCYTES, NITRITE, OR GLUCOSE <1000 mg/dL.    Dg Chest 2 View  08/17/2013   CLINICAL DATA:  Chest heaviness on the left.  EXAM:  CHEST  2 VIEW  COMPARISON:  PA and lateral chest 01/16/2009.  FINDINGS: There is unchanged elevation of the left hemidiaphragm. Lungs are clear. Heart size is normal. AICD is in place. Leads from prior AICD on the left noted, unchanged.  IMPRESSION: No acute disease.  Stable compared to prior exam.   Electronically Signed   By: Inge Rise M.D.   On: 08/17/2013 16:07    Review of Systems  Constitutional: Negative.   HENT: Negative.   Eyes: Negative.   Respiratory: Negative.  Negative for shortness of breath.   Cardiovascular: Positive for chest pain.  Gastrointestinal: Negative.   Genitourinary: Negative.   Musculoskeletal: Negative.   Skin: Negative.   Neurological: Negative.  Negative for dizziness.  Endo/Heme/Allergies: Negative.   Psychiatric/Behavioral: Negative.   All other systems reviewed and are negative.  Blood pressure 108/61, pulse 74, resp. rate 18, SpO2 96.00%. Physical Exam  Nursing note and vitals reviewed. Constitutional: He is oriented to person, place, and time. He appears well-developed. No distress.  HENT:  Head: Normocephalic.  Eyes: Pupils are equal, round, and reactive to light.  Neck: No JVD present.  Cardiovascular: Normal rate and regular rhythm.  Exam reveals no friction rub.   No murmur heard. There is an ICD generator in the left upper chest  Respiratory: Effort normal and breath sounds normal. He has no rales.  GI: Soft. Bowel sounds are normal.  Musculoskeletal: He exhibits no edema.  Neurological: He is alert and oriented to person, place, and time.  Skin: Skin is warm and dry. He is not diaphoretic.   ECG shows A-V paced rhythm  Assessment/Plan: [name] is a pleasant 63 year old male with history of cardiomyopathy. He has prior PTCA of the LAD. He has complete heart block and has an AICD. He is A-V paced. Today he presents with chest pain that is non-cardiac in nature. His cardiac biomarkers are negative at this point. His ECG is paced,  so is not useful for assess for ischemia. Given the length of his symptoms he should have some elevation in troponin if the pain was secondary to ischemia. I would check another troponin and if negative would be safe discharging him home.  Chest pain- noncardiac. Would check second troponin, if negative would discharfge  Cardiomyopathy- appears euvolemic. Would not change current medical therapy  Complete heart block- he is being paced appropriately  Skylynne Schlechter C 08/17/2013, 6:00 PM

## 2013-08-17 NOTE — ED Notes (Signed)
The pt reports that his bp has been high for the past 2 days.  He denies any chest pain.  Alert  Skin warm and dry comfortable watching tv

## 2013-08-17 NOTE — Discharge Instructions (Signed)

## 2013-08-19 LAB — URINE CULTURE
Colony Count: NO GROWTH
Culture: NO GROWTH

## 2013-08-27 NOTE — ED Notes (Signed)
Medical screening examination/treatment/procedure(s) were performed by non-physician practitioner and as supervising physician I was immediately available for consultation/collaboration.   EKG Interpretation   Date/Time:  Saturday August 17 2013 15:16:43 EDT Ventricular Rate:  79 PR Interval:  134 QRS Duration: 196 QT Interval:  486 QTC Calculation: 557 R Axis:   -4 Text Interpretation:  AV dual-paced rhythm Abnormal ECG When compared with  ECG of 04/10/2013 No significant change was found Confirmed by [facility]  MD,  Nunzio Cory [phone]) on 08/17/2013 5:43:36 PM        Francine Graven, DO 08/27/13 1740

## 2013-09-24 ENCOUNTER — Ambulatory Visit (INDEPENDENT_AMBULATORY_CARE_PROVIDER_SITE_OTHER): Payer: Medicare Other | Admitting: Internal Medicine

## 2013-09-24 ENCOUNTER — Encounter: Payer: Self-pay | Admitting: Internal Medicine

## 2013-09-24 VITALS — BP 112/62 | HR 89 | Ht 68.0 in | Wt 189.4 lb

## 2013-09-24 DIAGNOSIS — R0602 Shortness of breath: Secondary | ICD-10-CM

## 2013-09-24 DIAGNOSIS — I5042 Chronic combined systolic (congestive) and diastolic (congestive) heart failure: Secondary | ICD-10-CM

## 2013-09-24 MED ORDER — CARVEDILOL 3.125 MG PO TABS: 3.1250 mg | ORAL_TABLET | Freq: Two times a day (BID) | ORAL | Status: DC

## 2013-09-24 NOTE — Progress Notes (Signed)
seborrheic keratosis include Coumadin) forf Patient Care Team: Patricia Nettle, MD as PCP - General (Cardiology)   HPI  [name] is a 63 y.o. male is seen in followup for ventricular tachycardia in the setting of ischemic and nonischemic heart disease. He is status post ICD implantation.  2010 because of symptoms of progressive shortness of breath, he underwent evaluation including an echo demonstrating an ejection fraction of 40% or so and his Myoview scan demonstrated no ischemia. Repeat myoview scan 3/15 demonstrated infarct with peri-infarct ischemia with an estimated ejection fraction of 40%.   As such, we did not pursue CRT implantation and not withstanding heart failure symptoms. We could not get preapproval.  He has complaints of nearly daily hour-long chest discomfort. It has been accompanied by persistent but not acutely worsening dyspnea on exertion. He has PND  he has no orthopnea or significant peripheral edema     Previously implanted ICD was associated with failure   to cannulate the coronary sinus from the right side . The comment from the procedure and, interestingly, suggests that the left subclavian vein was patent.   Past Medical History  Diagnosis Date  . Chronic back pain     a. s/p multiple lumbar surgeries  . GERD (gastroesophageal reflux disease)   . Hyperlipidemia   . Myasthenia gravis   . Monomorphic ventricular tachycardia     a. s/p ICD 1997;   b. 06/2007 ICD upgrade to MDT Concerto Bi-V though LV lead unable to be placed.  . Complete heart block     a. h/o transient complete heart block in April 2009  . Sciatica   . DJD (degenerative joint disease)   . Ischemic cardiomyopathy     a. 05/2009 Echo: EF 40%, mild to mod glob HK, Gr 2 dd, mild LVH, Triv AI, Mild MR, PASP 37-64mmHg  . CAD (coronary artery disease)     a. s/p PCI/BMS of prox LAD in 1997;  b. 4/10 LHC: nonobs dzs. (pRCA 50-70%);  c.   Myoview 08/23/11 : Scar with peri-infarct ischemia  infecting the entire septum with a corresponding wall motion abnormality, moderate risk scan, EF 42%.    Past Surgical History  Procedure Laterality Date  . Cardiac defibrillator placement  2001; 2008    single chamber MDT ICD implanted for secondary prevention; gen change 2008; explantation 2009  . Coronary angioplasty with stent placement      bare metal stenting of the LAD in 1997  . Anterior lumbar interbody fusion  04/23/2007    L4-L5   . Gallbladder surgery    . Implantable cardioverter defibrillator implant  2009; 2015    MDT dual chamber right sided device implanted 2009 with gen change 2015; unable to place LV lead    Current Outpatient Prescriptions  Medication Sig Dispense Refill  . aspirin EC 81 MG tablet Take 1 tablet (81 mg total) by mouth daily.      Marland Kitchen azaTHIOprine (IMURAN) 50 MG tablet Take 150 mg by mouth daily.       . furosemide (LASIX) 40 MG tablet Take 60 mg by mouth daily.      Marland Kitchen HYDROcodone-acetaminophen (NORCO) 7.5-325 MG per tablet Take 1 tablet by mouth daily as needed (pain).       Marland Kitchen ipratropium (ATROVENT) 0.03 % nasal spray Place 2 sprays into both nostrils 3 (three) times daily as needed for rhinitis.      Marland Kitchen isosorbide-hydrALAZINE (BIDIL) 20-37.5 MG per tablet Take 1 tablet by mouth  2 (two) times daily.      . magnesium oxide (MAG-OX) 400 MG tablet Take 400 mg by mouth 2 (two) times daily.      . Nutritional Supplements (GRAPESEED EXTRACT PO) Take 2 capsules by mouth daily.      Marland Kitchen omeprazole (PRILOSEC) 20 MG capsule Take 20 mg by mouth daily.      Marland Kitchen OVER THE COUNTER MEDICATION Take 1 tablet by mouth 3 (three) times a week. "blue green algae" (Days of the week vary)      . OVER THE COUNTER MEDICATION Take 2 tablets by mouth 3 (three) times a week. Health food store digestive enzymes tablets. (Days of the week vary)      . Polyvinyl Alcohol-Povidone (REFRESH OP) Place 1 drop into both eyes daily as needed (red eyes).      . potassium chloride SA  (K-DUR,KLOR-CON) 20 MEQ tablet Take 20 mEq by mouth daily at 12 noon.       . predniSONE (DELTASONE) 5 MG tablet Take 5 mg by mouth every other day.        . ramipril (ALTACE) 5 MG capsule Take 1 capsule (5 mg total) by mouth daily.  90 capsule  2  . simvastatin (ZOCOR) 20 MG tablet Take 20 mg by mouth at bedtime.       . sotalol (BETAPACE) 120 MG tablet Take 120 mg by mouth 2 (two) times daily.       No current facility-administered medications for this visit.    Allergies  Allergen Reactions  . Procainamide Other (See Comments)    Allergy is from [facility] - pt is not aware of any reaction to this medicine  . Quinidine Other (See Comments)    Allergy is from [facility] records - pt is not aware of any reaction to this medicine    Review of Systems negative except from HPI and PMH  Physical Exam BP 112/62  Pulse 89  Ht 5\' 8"  (1.727 m)  Wt 189 lb 6.4 oz (85.911 kg)  BMI 28.80 kg/m2 Well developed and nourished in no acute distress HENT normal Neck supple with JVP-flat Clear Regular rate and rhythm, no murmurs or gallops Abd-soft with active BS No Clubbing cyanosis edema Skin-warm and dry A & Oriented  Grossly normal sensory and motor function  Skin Warm and Dry    Assessment and  Plan  Ischemic and nonischemic heart disease  Congestive heart failure-class IIIB  Complete heart block-device dependent  Implanted defibrillator-St. Jude-dual-chamber  Myasthenia gravis S./P. Thymectomy  He has significant symptoms of exercise intolerance without manifesting volume overload. There is concerning. We will recheck an echo to see whether his ejection fraction is in the range that would invite CRT upgrade.  We'll also begin him on carvedilol adjunctively to sotalol in addition to his ACE inhibitor his hydralazine/nitrates.  I think he needs right and left heart catheterization; I will discuss with the heart failure team as to whether they would like to see  him before after these data are back.  We will be a candidate for Ivabradine with his relative sinus tachycardia

## 2013-09-24 NOTE — Patient Instructions (Addendum)
Your physician has recommended you make the following change in your medication:  1) START Coreg  You have been referred to [place] physician has requested that you have an echocardiogram. Echocardiography is a painless test that uses sound waves to create images of your heart. It provides your doctor with information about the size and shape of your heart and how well your heart's chambers and valves are working. This procedure takes approximately one hour. There are no restrictions for this procedure.  We will determine follow up after Dr. Caryl Comes had spoke with heart failure clinic and obtained echo results.

## 2013-09-25 LAB — MDC_IDC_ENUM_SESS_TYPE_INCLINIC
Battery Remaining Longevity: 96 mo
Battery Voltage: 3.01 V
Brady Statistic AP VS Percent: 0 %
Brady Statistic AS VP Percent: 90.71 %
Brady Statistic RA Percent Paced: 9.15 %
Date Time Interrogation Session: 20150922191113
HIGH POWER IMPEDANCE MEASURED VALUE: 48 Ohm
HighPow Impedance: 190 Ohm
HighPow Impedance: 40 Ohm
Lead Channel Impedance Value: 380 Ohm
Lead Channel Impedance Value: 437 Ohm
Lead Channel Pacing Threshold Amplitude: 0.625 V
Lead Channel Pacing Threshold Amplitude: 1.75 V
Lead Channel Pacing Threshold Pulse Width: 0.4 ms
Lead Channel Pacing Threshold Pulse Width: 0.4 ms
Lead Channel Sensing Intrinsic Amplitude: 1.5 mV
Lead Channel Sensing Intrinsic Amplitude: 13.125 mV
Lead Channel Sensing Intrinsic Amplitude: 2 mV
Lead Channel Setting Pacing Amplitude: 2.5 V
Lead Channel Setting Pacing Pulse Width: 0.4 ms
Lead Channel Setting Sensing Sensitivity: 0.3 mV
MDC IDC SET LEADCHNL RA PACING AMPLITUDE: 2.5 V
MDC IDC SET ZONE DETECTION INTERVAL: 380 ms
MDC IDC STAT BRADY AP VP PERCENT: 9.15 %
MDC IDC STAT BRADY AS VS PERCENT: 0.14 %
MDC IDC STAT BRADY RV PERCENT PACED: 99.77 %
Zone Setting Detection Interval: 250 ms
Zone Setting Detection Interval: 300 ms
Zone Setting Detection Interval: 350 ms
Zone Setting Detection Interval: 360 ms

## 2013-09-30 ENCOUNTER — Other Ambulatory Visit (HOSPITAL_COMMUNITY): Payer: Medicare Other | Admitting: *Deleted

## 2013-09-30 ENCOUNTER — Ambulatory Visit (HOSPITAL_COMMUNITY): Payer: Medicare Other | Attending: Internal Medicine | Admitting: Radiology

## 2013-09-30 DIAGNOSIS — I2589 Other forms of chronic ischemic heart disease: Secondary | ICD-10-CM

## 2013-09-30 DIAGNOSIS — E785 Hyperlipidemia, unspecified: Secondary | ICD-10-CM | POA: Insufficient documentation

## 2013-09-30 DIAGNOSIS — Z95810 Presence of automatic (implantable) cardiac defibrillator: Secondary | ICD-10-CM

## 2013-09-30 DIAGNOSIS — I442 Atrioventricular block, complete: Secondary | ICD-10-CM | POA: Diagnosis not present

## 2013-09-30 DIAGNOSIS — R0602 Shortness of breath: Secondary | ICD-10-CM

## 2013-09-30 DIAGNOSIS — I5042 Chronic combined systolic (congestive) and diastolic (congestive) heart failure: Secondary | ICD-10-CM

## 2013-09-30 DIAGNOSIS — I2510 Atherosclerotic heart disease of native coronary artery without angina pectoris: Secondary | ICD-10-CM

## 2013-09-30 MED ORDER — PERFLUTREN LIPID MICROSPHERE
1.0000 mL | INTRAVENOUS | Status: AC | PRN
  Administered 2013-09-30: 2 mL via INTRAVENOUS

## 2013-09-30 NOTE — Progress Notes (Signed)
Echocardiogram performed with Definity.  

## 2013-10-01 ENCOUNTER — Encounter: Payer: Self-pay | Admitting: *Deleted

## 2013-10-09 ENCOUNTER — Inpatient Hospital Stay (HOSPITAL_COMMUNITY): Admission: RE | Admit: 2013-10-09 | Payer: Medicare Other | Source: Ambulatory Visit

## 2013-10-09 ENCOUNTER — Encounter (HOSPITAL_COMMUNITY): Payer: Self-pay

## 2013-10-09 ENCOUNTER — Telehealth: Payer: Self-pay | Admitting: *Deleted

## 2013-10-09 ENCOUNTER — Encounter: Payer: Self-pay | Admitting: *Deleted

## 2013-10-09 DIAGNOSIS — Z01812 Encounter for preprocedural laboratory examination: Secondary | ICD-10-CM

## 2013-10-09 DIAGNOSIS — I5042 Chronic combined systolic (congestive) and diastolic (congestive) heart failure: Secondary | ICD-10-CM

## 2013-10-09 NOTE — Telephone Encounter (Signed)
Called patient to schedule CRT-D upgrade (secondary to echo results). Procedure scheduled for 10/12.  Pre procedure labs 10/9. Wound check 10/22. Reviewed letter of instructions with patient and left at front desk for his pick up on Friday when he is in office for labs. Patient verbalized understanding and agreeable to plan.

## 2013-10-11 ENCOUNTER — Other Ambulatory Visit (INDEPENDENT_AMBULATORY_CARE_PROVIDER_SITE_OTHER): Payer: Medicare Other | Admitting: *Deleted

## 2013-10-11 DIAGNOSIS — Z01812 Encounter for preprocedural laboratory examination: Secondary | ICD-10-CM

## 2013-10-11 DIAGNOSIS — I5042 Chronic combined systolic (congestive) and diastolic (congestive) heart failure: Secondary | ICD-10-CM

## 2013-10-11 LAB — BASIC METABOLIC PANEL WITH GFR
BUN: 17 mg/dL (ref 6–23)
CO2: 27 meq/L (ref 19–32)
Calcium: 9 mg/dL (ref 8.4–10.5)
Chloride: 104 meq/L (ref 96–112)
Creatinine, Ser: 1.1 mg/dL (ref 0.4–1.5)
GFR: 87.21 mL/min
Glucose, Bld: 87 mg/dL (ref 70–99)
Potassium: 4 meq/L (ref 3.5–5.1)
Sodium: 137 meq/L (ref 135–145)

## 2013-10-11 LAB — CBC WITH DIFFERENTIAL/PLATELET
Basophils Absolute: 0 K/uL (ref 0.0–0.1)
Basophils Relative: 0.5 % (ref 0.0–3.0)
Eosinophils Absolute: 0.2 K/uL (ref 0.0–0.7)
Eosinophils Relative: 4.4 % (ref 0.0–5.0)
HCT: 43.2 % (ref 39.0–52.0)
Hemoglobin: 14.2 g/dL (ref 13.0–17.0)
Lymphocytes Relative: 25.3 % (ref 12.0–46.0)
Lymphs Abs: 1.3 K/uL (ref 0.7–4.0)
MCHC: 32.8 g/dL (ref 30.0–36.0)
MCV: 93.9 fl (ref 78.0–100.0)
Monocytes Absolute: 0.5 K/uL (ref 0.1–1.0)
Monocytes Relative: 10.5 % (ref 3.0–12.0)
Neutro Abs: 3.1 K/uL (ref 1.4–7.7)
Neutrophils Relative %: 59.3 % (ref 43.0–77.0)
Platelets: 215 K/uL (ref 150.0–400.0)
RBC: 4.6 Mil/uL (ref 4.22–5.81)
RDW: 13.1 % (ref 11.5–15.5)
WBC: 5.2 K/uL (ref 4.0–10.5)

## 2013-10-14 ENCOUNTER — Encounter (HOSPITAL_COMMUNITY)
Admission: RE | Disposition: A | Discharge status: Home / Self Care | Payer: Medicare Other | Source: Ambulatory Visit | Attending: Internal Medicine

## 2013-10-14 ENCOUNTER — Ambulatory Visit (HOSPITAL_COMMUNITY)
Admission: RE | Admit: 2013-10-14 | Discharge: 2013-10-14 | Disposition: A | Discharge status: Home / Self Care | Payer: Medicare Other | Source: Ambulatory Visit | Attending: Internal Medicine | Admitting: Internal Medicine

## 2013-10-14 DIAGNOSIS — I442 Atrioventricular block, complete: Secondary | ICD-10-CM | POA: Diagnosis not present

## 2013-10-14 DIAGNOSIS — E785 Hyperlipidemia, unspecified: Secondary | ICD-10-CM | POA: Diagnosis not present

## 2013-10-14 DIAGNOSIS — G7000 Myasthenia gravis without (acute) exacerbation: Secondary | ICD-10-CM | POA: Diagnosis not present

## 2013-10-14 DIAGNOSIS — Z7952 Long term (current) use of systemic steroids: Secondary | ICD-10-CM | POA: Insufficient documentation

## 2013-10-14 DIAGNOSIS — I255 Ischemic cardiomyopathy: Secondary | ICD-10-CM | POA: Insufficient documentation

## 2013-10-14 DIAGNOSIS — I2510 Atherosclerotic heart disease of native coronary artery without angina pectoris: Secondary | ICD-10-CM | POA: Diagnosis not present

## 2013-10-14 DIAGNOSIS — I472 Ventricular tachycardia: Secondary | ICD-10-CM | POA: Diagnosis present

## 2013-10-14 DIAGNOSIS — Z7982 Long term (current) use of aspirin: Secondary | ICD-10-CM | POA: Diagnosis not present

## 2013-10-14 DIAGNOSIS — I420 Dilated cardiomyopathy: Secondary | ICD-10-CM

## 2013-10-14 DIAGNOSIS — K219 Gastro-esophageal reflux disease without esophagitis: Secondary | ICD-10-CM | POA: Diagnosis not present

## 2013-10-14 DIAGNOSIS — Z95810 Presence of automatic (implantable) cardiac defibrillator: Secondary | ICD-10-CM | POA: Diagnosis not present

## 2013-10-14 HISTORY — PX: BI-VENTRICULAR PACEMAKER UPGRADE: SHX5464

## 2013-10-14 LAB — SURGICAL PCR SCREEN
MRSA, PCR: NEGATIVE
STAPHYLOCOCCUS AUREUS: POSITIVE — AB

## 2013-10-14 SURGERY — BI-VENTRICULAR PACEMAKER UPGRADE
Anesthesia: LOCAL

## 2013-10-14 MED ORDER — ACETAMINOPHEN 325 MG PO TABS: 325.0000 mg | ORAL_TABLET | ORAL | Status: DC | PRN

## 2013-10-14 MED ORDER — FENTANYL CITRATE 0.05 MG/ML IJ SOLN
INTRAMUSCULAR | Status: AC
  Filled 2013-10-14: qty 2

## 2013-10-14 MED ORDER — MIDAZOLAM HCL 5 MG/5ML IJ SOLN
INTRAMUSCULAR | Status: AC
  Filled 2013-10-14: qty 5

## 2013-10-14 MED ORDER — LIDOCAINE HCL (PF) 1 % IJ SOLN
INTRAMUSCULAR | Status: AC
Start: 2013-10-14 — End: 2013-10-14
  Filled 2013-10-14: qty 30

## 2013-10-14 MED ORDER — SODIUM CHLORIDE 0.9 % IV SOLN: INTRAVENOUS | Status: DC

## 2013-10-14 MED ORDER — CEFAZOLIN SODIUM-DEXTROSE 2-3 GM-% IV SOLR: 2.0000 g | INTRAVENOUS | Status: DC

## 2013-10-14 MED ORDER — SODIUM CHLORIDE 0.9 % IR SOLN
Status: DC
Start: 2013-10-14 — End: 2013-10-14
  Filled 2013-10-14: qty 2

## 2013-10-14 MED ORDER — SODIUM CHLORIDE 0.9 % IV SOLN
INTRAVENOUS | Status: DC
  Administered 2013-10-14: 06:00:00 via INTRAVENOUS

## 2013-10-14 MED ORDER — CHLORHEXIDINE GLUCONATE 4 % EX LIQD
60.0000 mL | Freq: Once | CUTANEOUS | Status: DC
  Filled 2013-10-14: qty 60

## 2013-10-14 MED ORDER — MUPIROCIN 2 % EX OINT
TOPICAL_OINTMENT | CUTANEOUS | Status: AC
  Filled 2013-10-14: qty 22

## 2013-10-14 MED ORDER — ONDANSETRON HCL 4 MG/2ML IJ SOLN: 4.0000 mg | Freq: Four times a day (QID) | INTRAMUSCULAR | Status: DC | PRN

## 2013-10-14 MED ORDER — CEFAZOLIN SODIUM-DEXTROSE 2-3 GM-% IV SOLR
INTRAVENOUS | Status: AC
  Filled 2013-10-14: qty 50

## 2013-10-14 MED ORDER — SODIUM CHLORIDE 0.9 % IR SOLN
80.0000 mg | Status: DC
  Filled 2013-10-14: qty 2

## 2013-10-14 MED ORDER — LIDOCAINE HCL (PF) 1 % IJ SOLN
INTRAMUSCULAR | Status: AC
  Filled 2013-10-14: qty 30

## 2013-10-14 MED ORDER — MUPIROCIN 2 % EX OINT
1.0000 "application " | TOPICAL_OINTMENT | Freq: Once | CUTANEOUS | Status: AC
  Administered 2013-10-14: 1 via TOPICAL
  Filled 2013-10-14: qty 22

## 2013-10-14 NOTE — Interval H&P Note (Signed)
ICD Criteria2  Current LVEF:30% ;Obtained < 1 month ago.  NYHA Functional Classification: Class III  Heart Failure History:  Yes, Duration of heart failure since onset is > 9 months  Non-Ischemic Dilated Cardiomyopathy History:  Yes, timeframe is > 9 months  Atrial Fibrillation/Atrial Flutter:  No.  Ventricular Tachycardia History:  Yes, Hemodynamic status unknown. VT Type:  SVT - Monomorphic.  Cardiac Arrest History:  No  History of Syndromes with Risk of Sudden Death:  No.  Previous ICD:  Yes, ICD Type:  Dual, Reason for ICD:  Secondary, reason for secondary prevention:  Ventricular Tachycardia  Electrophysiology Study: No.  Prior MI: Yes, Most recent MI timeframe is > 40 days.  PPM: No.  OSA:  No  Patient Life Expectancy of >=1 year: Yes.  Anticoagulation Therapy:  Patient is NOT on anticoagulation therapy.   Beta Blocker Therapy:  Yes.   Ace Inhibitor/ARB Therapy:  Yes.History and Physical Interval Note:  10/14/2013 7:43 AM  Sherri Rad  has presented today for surgery, with the diagnosis of Upgrade to BIV; CHG  The various methods of treatment have been discussed with the patient and family. After consideration of risks, benefits and other options for treatment, the patient has consented to  Procedure(s): BI-VENTRICULAR PACEMAKER UPGRADE (N/A) as a surgical intervention .  The patient's history has been reviewed, patient examined, no change in status, stable for surgery.  I have reviewed the patient's chart and labs.  Questions were answered to the patient's satisfaction.     [name]

## 2013-10-14 NOTE — Discharge Instructions (Signed)
Pacemaker Battery Change, Care After °Refer to this sheet in the next few weeks. These instructions provide you with information on caring for yourself after your procedure. Your health care provider may also give you more specific instructions. Your treatment has been planned according to current medical practices, but problems sometimes occur. Call your health care provider if you have any problems or questions after your procedure. °WHAT TO EXPECT AFTER THE PROCEDURE °After your procedure, it is typical to have the following sensations: °· Soreness at the pacemaker site. °HOME CARE INSTRUCTIONS  °· Keep the incision clean and dry. °· Unless advised otherwise, you may shower beginning 48 hours after your procedure. °· For the first week after the replacement, avoid stretching motions that pull at the incision site, and avoid heavy exercise with the arm that is on the same side as the incision. °· Take medicines only as directed by your health care provider. °· Keep all follow-up visits as directed by your health care provider. °SEEK MEDICAL CARE IF:  °· You have pain at the incision site that is not relieved by over-the-counter or prescription medicine. °· There is drainage or pus from the incision site. °· There is swelling larger than a lime at the incision site. °· You develop red streaking that extends above or below the incision site. °· You feel brief, intermittent palpitations, light-headedness, or any symptoms that you feel might be related to your heart. °SEEK IMMEDIATE MEDICAL CARE IF:  °· You experience chest pain that is different than the pain at the pacemaker site. °· You experience shortness of breath. °· You have palpitations or irregular heartbeat. °· You have light-headedness that does not go away quickly. °· You faint. °· You have pain that gets worse and is not relieved by medicine. °Document Released: 10/10/2012 Document Revised: 05/06/2013 Document Reviewed: 10/10/2012 °ExitCare® Patient  Information ©2015 [employer]. This information is not intended to replace advice given to you by your health care provider. Make sure you discuss any questions you have with your health care provider. ° °

## 2013-10-14 NOTE — H&P (View-Only) (Signed)
seborrheic keratosis include Coumadin) forf Patient Care Team: Patricia Nettle, MD as PCP - General (Cardiology)   HPI  [name] is a 63 y.o. male is seen in followup for ventricular tachycardia in the setting of ischemic and nonischemic heart disease. He is status post ICD implantation.  2010 because of symptoms of progressive shortness of breath, he underwent evaluation including an echo demonstrating an ejection fraction of 40% or so and his Myoview scan demonstrated no ischemia. Repeat myoview scan 3/15 demonstrated infarct with peri-infarct ischemia with an estimated ejection fraction of 40%.   As such, we did not pursue CRT implantation and not withstanding heart failure symptoms. We could not get preapproval.  He has complaints of nearly daily hour-long chest discomfort. It has been accompanied by persistent but not acutely worsening dyspnea on exertion. He has PND  he has no orthopnea or significant peripheral edema     Previously implanted ICD was associated with failure   to cannulate the coronary sinus from the right side . The comment from the procedure and, interestingly, suggests that the left subclavian vein was patent.   Past Medical History  Diagnosis Date  . Chronic back pain     a. s/p multiple lumbar surgeries  . GERD (gastroesophageal reflux disease)   . Hyperlipidemia   . Myasthenia gravis   . Monomorphic ventricular tachycardia     a. s/p ICD 1997;   b. 06/2007 ICD upgrade to MDT Concerto Bi-V though LV lead unable to be placed.  . Complete heart block     a. h/o transient complete heart block in April 2009  . Sciatica   . DJD (degenerative joint disease)   . Ischemic cardiomyopathy     a. 05/2009 Echo: EF 40%, mild to mod glob HK, Gr 2 dd, mild LVH, Triv AI, Mild MR, PASP 37-53mmHg  . CAD (coronary artery disease)     a. s/p PCI/BMS of prox LAD in 1997;  b. 4/10 LHC: nonobs dzs. (pRCA 50-70%);  c.   Myoview 08/23/11 : Scar with peri-infarct ischemia  infecting the entire septum with a corresponding wall motion abnormality, moderate risk scan, EF 42%.    Past Surgical History  Procedure Laterality Date  . Cardiac defibrillator placement  2001; 2008    single chamber MDT ICD implanted for secondary prevention; gen change 2008; explantation 2009  . Coronary angioplasty with stent placement      bare metal stenting of the LAD in 1997  . Anterior lumbar interbody fusion  04/23/2007    L4-L5   . Gallbladder surgery    . Implantable cardioverter defibrillator implant  2009; 2015    MDT dual chamber right sided device implanted 2009 with gen change 2015; unable to place LV lead    Current Outpatient Prescriptions  Medication Sig Dispense Refill  . aspirin EC 81 MG tablet Take 1 tablet (81 mg total) by mouth daily.      Marland Kitchen azaTHIOprine (IMURAN) 50 MG tablet Take 150 mg by mouth daily.       . furosemide (LASIX) 40 MG tablet Take 60 mg by mouth daily.      Marland Kitchen HYDROcodone-acetaminophen (NORCO) 7.5-325 MG per tablet Take 1 tablet by mouth daily as needed (pain).       Marland Kitchen ipratropium (ATROVENT) 0.03 % nasal spray Place 2 sprays into both nostrils 3 (three) times daily as needed for rhinitis.      Marland Kitchen isosorbide-hydrALAZINE (BIDIL) 20-37.5 MG per tablet Take 1 tablet by mouth  2 (two) times daily.      . magnesium oxide (MAG-OX) 400 MG tablet Take 400 mg by mouth 2 (two) times daily.      . Nutritional Supplements (GRAPESEED EXTRACT PO) Take 2 capsules by mouth daily.      Marland Kitchen omeprazole (PRILOSEC) 20 MG capsule Take 20 mg by mouth daily.      Marland Kitchen OVER THE COUNTER MEDICATION Take 1 tablet by mouth 3 (three) times a week. "blue green algae" (Days of the week vary)      . OVER THE COUNTER MEDICATION Take 2 tablets by mouth 3 (three) times a week. Health food store digestive enzymes tablets. (Days of the week vary)      . Polyvinyl Alcohol-Povidone (REFRESH OP) Place 1 drop into both eyes daily as needed (red eyes).      . potassium chloride SA  (K-DUR,KLOR-CON) 20 MEQ tablet Take 20 mEq by mouth daily at 12 noon.       . predniSONE (DELTASONE) 5 MG tablet Take 5 mg by mouth every other day.        . ramipril (ALTACE) 5 MG capsule Take 1 capsule (5 mg total) by mouth daily.  90 capsule  2  . simvastatin (ZOCOR) 20 MG tablet Take 20 mg by mouth at bedtime.       . sotalol (BETAPACE) 120 MG tablet Take 120 mg by mouth 2 (two) times daily.       No current facility-administered medications for this visit.    Allergies  Allergen Reactions  . Procainamide Other (See Comments)    Allergy is from [facility] records - pt is not aware of any reaction to this medicine  . Quinidine Other (See Comments)    Allergy is from [facility] records - pt is not aware of any reaction to this medicine    Review of Systems negative except from HPI and PMH  Physical Exam BP 112/62  Pulse 89  Ht 5\' 8"  (1.727 m)  Wt 189 lb 6.4 oz (85.911 kg)  BMI 28.80 kg/m2 Well developed and nourished in no acute distress HENT normal Neck supple with JVP-flat Clear Regular rate and rhythm, no murmurs or gallops Abd-soft with active BS No Clubbing cyanosis edema Skin-warm and dry A & Oriented  Grossly normal sensory and motor function  Skin Warm and Dry    Assessment and  Plan  Ischemic and nonischemic heart disease  Congestive heart failure-class IIIB  Complete heart block-device dependent  Implanted defibrillator-St. Jude-dual-chamber  Myasthenia gravis S./P. Thymectomy  He has significant symptoms of exercise intolerance without manifesting volume overload. There is concerning. We will recheck an echo to see whether his ejection fraction is in the range that would invite CRT upgrade.  We'll also begin him on carvedilol adjunctively to sotalol in addition to his ACE inhibitor his hydralazine/nitrates.  I think he needs right and left heart catheterization; I will discuss with the heart failure team as to whether they would like to see  him before after these data are back.  We will be a candidate for Ivabradine with his relative sinus tachycardia

## 2013-10-15 ENCOUNTER — Telehealth: Payer: Self-pay | Admitting: Internal Medicine

## 2013-10-15 NOTE — Telephone Encounter (Signed)
New message    Procedure on yesterday . C/O nose burning feels like it's on fire.  Was given cream to help.

## 2013-10-15 NOTE — Op Note (Signed)
NAME:  [name], [name] NO.:  [id number]  MEDICAL RECORD NO.:  [id number]  LOCATION:  [facility]                         FACILITY:  [place]  PHYSICIAN:  Deboraha Sprang, MD, FACCDATE OF BIRTH:  07/22/1950  DATE OF PROCEDURE:  10/14/2013 DATE OF DISCHARGE:  10/14/2013                              OPERATIVE REPORT   PREOPERATIVE DIAGNOSIS:  Congestive cardiomyopathy with nonischemic heart disease, coronary artery disease, previously implanted defibrillator with complete heart block.  POSTOPERATIVE DIAGNOSIS:  Congestive cardiomyopathy with nonischemic heart disease, coronary artery disease, previously implanted defibrillator with complete heart block; significant scarring in the left extrathoracic subclavian vein and innominate vein.  PROCEDURE:  Contrast venogram x2, aborted left ventricular lead placement.  SURGEON:  Deboraha Sprang, MD, [facility].  OPERATIVE DESCRIPTION:  Following obtaining informed consent, the patient was brought to the Electrophysiology laboratory and placed on the fluoroscopic table in supine position.  Contrast sonography of the left side demonstrated a stenotic area but a patent tract that had a sharp angle in it.  Venography of the right side demonstrated significant collateralization without clear evidence of dye that had been able to move from the lateral to the medial right subclavian vein.  This having been accomplished, we decided to pursue venous access on the left side.  After routine prep and drape, lidocaine was infiltrated cephalad to the prior incision and carried down to layer of the prepectoral fascia.  Scar tissue overlying the abandoned leads was avoided.  We were able to get access to the left subclavian vein and we were able to pass a small micropuncture wire through the first stenosis and tunnel down to what turned out to be an acute bend.  However, through the micropuncture system, we advanced a Glidewire to the right atrium  and this allowed for passage of the 5-French sheath just a little bit more distal, and we then placed a Worley wire through this down into the right atrium.  Efforts to place a 7-French sheath were challenging, and so we attempted to place an Amplatz Super Stiff wire through the same area; however, because of the acute angle, it could not pass the second region which was at the left aspect of the innominate vein.  We then replaced the system with a Worley wire and used a 5-French long PV catheter to access the superior vena cava and through this then passed the Amplatz Super Stiff wire.  We then dilated with the 7-French long dilator all the way to the subclavian vein.  We attempted to do the same with a 9-French but were not able to pass the 9-French sheath pass the acute angle.  This was despite the retained Amplatz Super Stiff wire. We then attempted to pass a CS sheath directly; a St. Jude sheath was too stiff and did not make that first bend.  The Medtronic sheath was much more pliable, did make the bend, then made another acute angle bend at the junction between the innominate and the SVC and passed.  It was able to be passed into the right atrium.  Manipulations within the right atrium were challenging.  I was not able to get except very infrequently with great difficulty  the curve of the MB-2 sheath the point anteriorly to allow it to pass it over the tricuspid valve.  We then attempted to use the Attain II sub-selective sheaths to allow rotation within the right atrium in the hope that we could pass a wire and then the Attain II system.  This turned out to be not successful.  I then attempted to use a 9-French long sheath to pass it from the left subclavian access site to the superior vena cava that would hopefully then give [place] the ability to manipulate sheath within it.  Unfortunately, however, I was not able to pass it past the first acute angle at the left aspect of  the innominate vein.  We then passed an MB-2 catheter back through this and we were able to get into the right atrium.  Using a D'Amato Curve catheter, we were finally able to cannulate the coronary sinus.  This occurred at about 48 minutes of fluoroscopy.  What happened next was that I was unable to advance the coronary sinus sheath over the EP catheter into the coronary sinus os.  Venography confirmed that we were in the right atrium and the efforts to cannulate the coronary sinus, given our knowledge of its location with a sub-selecting catheter, turned out to be unsuccessful.  After this last effort and now 49 minutes of fluoroscopy, I elected to abandon.  I should note that I had made the effort to try to remove the 9-French sheath in which the Medtronic CS sheath was placed.  It was very difficult even to remove this sheath.  Perhaps, venoplasty would have been effective adjunct along the way from the left subclavian vein to the superior vena cava.  The patient tolerated the procedure without apparent complication. Estimated blood loss was probably 20 mL to 40 mL.  We will watch the patient for a few hours and anticipate discharge with consideration of referral for epicardial lead placement.     Deboraha Sprang, MD, [facility]     SCK/MEDQ  D:  10/14/2013  T:  10/15/2013  Job:  [id number]

## 2013-10-15 NOTE — Telephone Encounter (Signed)
Cream given to patient, in hospital, was Mupirocin. I checked his lab results and explained to patient why he was given this in the hospital. I explained to him that he did receive oxygen for the procedure, but that it should not have irritated his nasal passages for the short time it was applied. Patient states that he is not sure but has been using the cream to try and help - advised to stop the cream secondary to possible allergic reaction. Advised him to contact PCP for further guidance on nose complaint, this is not related to surgery. Patient verbalized understanding and agreeable to plan.

## 2013-10-22 ENCOUNTER — Ambulatory Visit (INDEPENDENT_AMBULATORY_CARE_PROVIDER_SITE_OTHER): Payer: Medicare Other | Admitting: *Deleted

## 2013-10-22 DIAGNOSIS — I5042 Chronic combined systolic (congestive) and diastolic (congestive) heart failure: Secondary | ICD-10-CM

## 2013-10-22 DIAGNOSIS — I442 Atrioventricular block, complete: Secondary | ICD-10-CM

## 2013-10-22 LAB — MDC_IDC_ENUM_SESS_TYPE_INCLINIC
Battery Remaining Longevity: 96 mo
Battery Voltage: 3.01 V
Brady Statistic AS VP Percent: 90.55 %
Brady Statistic RA Percent Paced: 9.28 %
HighPow Impedance: 209 Ohm
HighPow Impedance: 41 Ohm
HighPow Impedance: 49 Ohm
Lead Channel Impedance Value: 399 Ohm
Lead Channel Impedance Value: 399 Ohm
Lead Channel Pacing Threshold Amplitude: 0.625 V
Lead Channel Pacing Threshold Amplitude: 1.75 V
Lead Channel Pacing Threshold Pulse Width: 0.4 ms
Lead Channel Sensing Intrinsic Amplitude: 2.75 mV
Lead Channel Setting Pacing Amplitude: 2.75 V
Lead Channel Setting Pacing Pulse Width: 0.4 ms
Lead Channel Setting Sensing Sensitivity: 0.3 mV
MDC IDC MSMT LEADCHNL RA SENSING INTR AMPL: 1.375 mV
MDC IDC MSMT LEADCHNL RV PACING THRESHOLD PULSEWIDTH: 0.4 ms
MDC IDC MSMT LEADCHNL RV SENSING INTR AMPL: 4.375 mV
MDC IDC SESS DTM: 20151020155335
MDC IDC SET LEADCHNL RV PACING AMPLITUDE: 2.5 V
MDC IDC SET ZONE DETECTION INTERVAL: 300 ms
MDC IDC SET ZONE DETECTION INTERVAL: 360 ms
MDC IDC STAT BRADY AP VP PERCENT: 9.27 %
MDC IDC STAT BRADY AP VS PERCENT: 0 %
MDC IDC STAT BRADY AS VS PERCENT: 0.17 %
MDC IDC STAT BRADY RV PERCENT PACED: 99.64 %
Zone Setting Detection Interval: 250 ms
Zone Setting Detection Interval: 350 ms
Zone Setting Detection Interval: 380 ms

## 2013-10-22 NOTE — Progress Notes (Signed)
Wound check appointment.  Wound without redness or edema. Incision edges approximated, wound well healed. Normal device function. Thresholds, sensing, and impedances consistent with implant measurements.  Histogram distribution appropriate for patient and level of activity. No mode switches or ventricular arrhythmias noted. Patient educated about wound care, arm mobility, lifting restrictions, shock plan. ROV in 3 months with implanting physician.

## 2013-10-24 ENCOUNTER — Ambulatory Visit: Payer: Medicare Other

## 2013-11-11 ENCOUNTER — Encounter: Payer: Self-pay | Admitting: Internal Medicine

## 2013-12-12 ENCOUNTER — Encounter (HOSPITAL_COMMUNITY): Payer: Self-pay | Admitting: Internal Medicine

## 2014-01-17 ENCOUNTER — Other Ambulatory Visit: Payer: Self-pay | Admitting: Internal Medicine

## 2014-01-21 ENCOUNTER — Ambulatory Visit (INDEPENDENT_AMBULATORY_CARE_PROVIDER_SITE_OTHER): Payer: Medicare Other | Admitting: Internal Medicine

## 2014-01-21 ENCOUNTER — Encounter: Payer: Self-pay | Admitting: Internal Medicine

## 2014-01-21 VITALS — BP 134/80 | HR 88 | Ht 68.5 in | Wt 189.6 lb

## 2014-01-21 DIAGNOSIS — I4729 Other ventricular tachycardia: Secondary | ICD-10-CM

## 2014-01-21 DIAGNOSIS — Z95810 Presence of automatic (implantable) cardiac defibrillator: Secondary | ICD-10-CM

## 2014-01-21 DIAGNOSIS — I472 Ventricular tachycardia: Secondary | ICD-10-CM

## 2014-01-21 DIAGNOSIS — Z4502 Encounter for adjustment and management of automatic implantable cardiac defibrillator: Secondary | ICD-10-CM

## 2014-01-21 DIAGNOSIS — I5042 Chronic combined systolic (congestive) and diastolic (congestive) heart failure: Secondary | ICD-10-CM

## 2014-01-21 DIAGNOSIS — I442 Atrioventricular block, complete: Secondary | ICD-10-CM

## 2014-01-21 LAB — MDC_IDC_ENUM_SESS_TYPE_INCLINIC
Brady Statistic AP VP Percent: 9.86 %
Brady Statistic AP VS Percent: 0 %
Brady Statistic AS VP Percent: 90 %
Brady Statistic AS VS Percent: 0.13 %
Brady Statistic RA Percent Paced: 9.86 %
Date Time Interrogation Session: 20160119165126
HighPow Impedance: 190 Ohm
HighPow Impedance: 42 Ohm
HighPow Impedance: 50 Ohm
Lead Channel Pacing Threshold Amplitude: 0.625 V
Lead Channel Pacing Threshold Amplitude: 1.375 V
Lead Channel Pacing Threshold Pulse Width: 0.4 ms
Lead Channel Pacing Threshold Pulse Width: 0.4 ms
Lead Channel Sensing Intrinsic Amplitude: 2 mV
Lead Channel Sensing Intrinsic Amplitude: 2.5 mV
Lead Channel Sensing Intrinsic Amplitude: 4.375 mV
Lead Channel Setting Pacing Amplitude: 2.5 V
Lead Channel Setting Pacing Pulse Width: 0.4 ms
MDC IDC MSMT BATTERY REMAINING LONGEVITY: 94 mo
MDC IDC MSMT BATTERY VOLTAGE: 2.99 V
MDC IDC MSMT LEADCHNL RA IMPEDANCE VALUE: 456 Ohm
MDC IDC MSMT LEADCHNL RV IMPEDANCE VALUE: 399 Ohm
MDC IDC SET LEADCHNL RA PACING AMPLITUDE: 3 V
MDC IDC SET LEADCHNL RV SENSING SENSITIVITY: 0.3 mV
MDC IDC SET ZONE DETECTION INTERVAL: 300 ms
MDC IDC SET ZONE DETECTION INTERVAL: 360 ms
MDC IDC STAT BRADY RV PERCENT PACED: 99.77 %
Zone Setting Detection Interval: 250 ms
Zone Setting Detection Interval: 350 ms
Zone Setting Detection Interval: 380 ms

## 2014-01-21 MED ORDER — SACUBITRIL-VALSARTAN 49-51 MG PO TABS: 1.0000 | ORAL_TABLET | Freq: Two times a day (BID) | ORAL | Status: DC

## 2014-01-21 NOTE — Patient Instructions (Addendum)
Your physician has recommended you make the following change in your medication:  1) STOP Ramipril 2) START Entresto 49/51 mg twice daily  Labs today: Magnesium  Your physician recommends that you schedule a follow-up appointment in: 2-4 weeks with a PA/NP to follow-up with titration of medication and blood pressure.  Remote monitoring is used to monitor your Pacemaker of ICD from home. This monitoring reduces the number of office visits required to check your device to one time per year. It allows [place] to keep an eye on the functioning of your device to ensure it is working properly. You are scheduled for a device check from home on 04/22/14. You may send your transmission at any time that day. If you have a wireless device, the transmission will be sent automatically. After your physician reviews your transmission, you will receive a postcard with your next transmission date.  Your physician wants you to follow-up in: 6 months with Dr. Caryl Comes. You will receive a reminder letter in the mail two months in advance. If you don't receive a letter, please call our office to schedule the follow-up appointment.

## 2014-01-21 NOTE — Progress Notes (Signed)
seborrheic keratosis include Coumadin) forf Patient Care Team: Patricia Nettle, MD as PCP - General (Cardiology)   HPI  [name] is a 64 y.o. male is seen in followup for ventricular tachycardia in the setting of ischemic and nonischemic heart disease. He is status post ICD implantation.  2010 because of symptoms of progressive shortness of breath, he underwent evaluation including an echo demonstrating an ejection fraction of 40% or so and his Myoview scan demonstrated no ischemia. Repeat myoview scan 8/13 demonstrated infarct with peri-infarct ischemia.catheterization February 2014 demonstrated no obstructive coronary disease.   Exercise exercise intolerance so we undertook cardiopulmonary stress testingy which showed a max heart rate of 110 beats per minute. Rate response was improved and an interval visit in August symptoms were better   There was worsening symptoms in October we undertook a reattempt at LV lead placement from the left side. During that there is left subclavian venous stenosis which were finally able to navigate. However, this wasn't sufficiently done so that when we got access to the coronary sinus was unable to deliver the sheath. We ultimately were unable to place an LV lead.  At this point he is doing better. He  Has no edema. He is sleeping flat. He is able to climb a flight of stairs.   Past Medical History  Diagnosis Date  . Chronic back pain     a. s/p multiple lumbar surgeries  . GERD (gastroesophageal reflux disease)   . Hyperlipidemia   . Myasthenia gravis   . Monomorphic ventricular tachycardia     a. s/p ICD 1997;   b. 06/2007 ICD upgrade to MDT Concerto Bi-V though LV lead unable to be placed.  . Complete heart block     a. h/o transient complete heart block in April 2009  . Sciatica   . DJD (degenerative joint disease)   . Ischemic cardiomyopathy     a. 05/2009 Echo: EF 40%, mild to mod glob HK, Gr 2 dd, mild LVH, Triv AI, Mild MR, PASP  37-9mmHg  . CAD (coronary artery disease)     a. s/p PCI/BMS of prox LAD in 1997;  b. 4/10 LHC: nonobs dzs. (pRCA 50-70%);  c.   Myoview 08/23/11 : Scar with peri-infarct ischemia infecting the entire septum with a corresponding wall motion abnormality, moderate risk scan, EF 42%.    Past Surgical History  Procedure Laterality Date  . Cardiac defibrillator placement  2001; 2008    single chamber MDT ICD implanted for secondary prevention; gen change 2008; explantation 2009  . Coronary angioplasty with stent placement      bare metal stenting of the LAD in 1997  . Anterior lumbar interbody fusion  04/23/2007    L4-L5   . Gallbladder surgery    . Implantable cardioverter defibrillator implant  2009; 2015    MDT dual chamber right sided device implanted 2009 with gen change 2015; unable to place LV lead  . Bi-ventricular implantable cardioverter defibrillator upgrade N/A 04/10/2013    Procedure: BI-VENTRICULAR IMPLANTABLE CARDIOVERTER DEFIBRILLATOR UPGRADE;  Surgeon: Deboraha Sprang, MD;  Location: [facility] CATH LAB;  Service: Cardiovascular;  Laterality: N/A;  . Bi-ventricular pacemaker upgrade N/A 10/14/2013    Procedure: BI-VENTRICULAR PACEMAKER UPGRADE;  Surgeon: Deboraha Sprang, MD;  Location: [facility] CATH LAB;  Service: Cardiovascular;  Laterality: N/A;    Current Outpatient Prescriptions  Medication Sig Dispense Refill  . aspirin EC 81 MG tablet Take 1 tablet (81 mg total) by mouth daily.    Marland Kitchen  azaTHIOprine (IMURAN) 50 MG tablet Take 150 mg by mouth daily.     Marland Kitchen BIDIL 20-37.5 MG per tablet TAKE ONE TABLET BY MOUTH TWICE DAILY 180 tablet 0  . carvedilol (COREG) 3.125 MG tablet Take 1 tablet (3.125 mg total) by mouth 2 (two) times daily. 60 tablet 3  . furosemide (LASIX) 40 MG tablet Take 60 mg by mouth daily.    Marland Kitchen HYDROcodone-acetaminophen (NORCO) 10-325 MG per tablet Take 1 tablet by mouth every 6 (six) hours as needed for severe pain.    Marland Kitchen ipratropium (ATROVENT) 0.03 % nasal spray Place 2 sprays  into both nostrils 3 (three) times daily as needed for rhinitis.    Marland Kitchen isosorbide-hydrALAZINE (BIDIL) 20-37.5 MG per tablet Take 1 tablet by mouth 2 (two) times daily.    . magnesium oxide (MAG-OX) 400 MG tablet Take 400 mg by mouth daily.     . Nutritional Supplements (GRAPESEED EXTRACT PO) Take 2 capsules by mouth daily.    Marland Kitchen omeprazole (PRILOSEC) 40 MG capsule Take 40 mg by mouth daily.    Marland Kitchen OVER THE COUNTER MEDICATION Take 1 tablet by mouth daily. Blue green algae    . OVER THE COUNTER MEDICATION Take 2 tablets by mouth daily. Digestive enzymes    . Polyvinyl Alcohol-Povidone (REFRESH OP) Place 1 drop into both eyes daily as needed (red eyes).    . potassium chloride SA (K-DUR,KLOR-CON) 20 MEQ tablet Take 20 mEq by mouth daily at 12 noon.     . predniSONE (DELTASONE) 5 MG tablet Take 5 mg by mouth every other day.      . ramipril (ALTACE) 5 MG capsule Take 1 capsule (5 mg total) by mouth daily. 90 capsule 2  . simvastatin (ZOCOR) 20 MG tablet Take 20 mg by mouth at bedtime.     . sotalol (BETAPACE) 120 MG tablet Take 120 mg by mouth 2 (two) times daily.    . sotalol (BETAPACE) 120 MG tablet TAKE ONE TABLET BY MOUTH TWICE DAILY ( PATIENT NEEDS APPOINTMENT) 180 tablet 0   No current facility-administered medications for this visit.    Allergies  Allergen Reactions  . Procainamide Other (See Comments)    Allergy is from [facility] records - pt is not aware of any reaction to this medicine  . Quinidine Other (See Comments)    Allergy is from [facility] records - pt is not aware of any reaction to this medicine    Review of Systems negative except from HPI and PMH  Physical Exam BP 134/80 mmHg  Pulse 88  Ht 5' 8.5" (1.74 m)  Wt 189 lb 9.6 oz (86.002 kg)  BMI 28.41 kg/m2 Well developed and nourished in no acute distress HENT normal Neck supple with JVP-flat Clear Regular rate and rhythm, no murmurs or gallops Abd-soft with active BS No Clubbing cyanosis edema Skin-warm and  dry A & Oriented  Grossly normal sensory and motor function  Skin Warm and Dry    Assessment and  Plan   Ischemic and nonischemic cardiomyopathy  Ventricular tachycardia  Chronic systolic heart failure  Implantable defibrillator Medtronic The patient's device was interrogated.  The information was reviewed. No changes were made in the programming.     Hypertension  He is euvolemic. He is functioning on a class II level. At this point we will hold off on consideration of epicardial lead placement.  There is been no intercurrent ventricular tachycardia. We will continue him on his current dose of sotalol as his renal  function is normal (GFR 87--10/15)  We will stop his ramipril and put him on Entresto. We will have him come back per protocol to see if we can pursue up titration.

## 2014-01-22 LAB — MAGNESIUM: Magnesium: 1.9 mg/dL (ref 1.5–2.5)

## 2014-02-11 ENCOUNTER — Other Ambulatory Visit: Payer: Self-pay | Admitting: Internal Medicine

## 2014-02-17 ENCOUNTER — Encounter: Payer: Self-pay | Admitting: Physician Assistant

## 2014-02-17 ENCOUNTER — Ambulatory Visit (INDEPENDENT_AMBULATORY_CARE_PROVIDER_SITE_OTHER): Payer: Medicare Other | Admitting: Physician Assistant

## 2014-02-17 VITALS — BP 70/50 | HR 77 | Ht 68.5 in | Wt 187.0 lb

## 2014-02-17 DIAGNOSIS — I2510 Atherosclerotic heart disease of native coronary artery without angina pectoris: Secondary | ICD-10-CM

## 2014-02-17 DIAGNOSIS — I9589 Other hypotension: Secondary | ICD-10-CM

## 2014-02-17 DIAGNOSIS — I5022 Chronic systolic (congestive) heart failure: Secondary | ICD-10-CM

## 2014-02-17 DIAGNOSIS — I4729 Other ventricular tachycardia: Secondary | ICD-10-CM

## 2014-02-17 DIAGNOSIS — I472 Ventricular tachycardia: Secondary | ICD-10-CM

## 2014-02-17 DIAGNOSIS — E785 Hyperlipidemia, unspecified: Secondary | ICD-10-CM

## 2014-02-17 DIAGNOSIS — I255 Ischemic cardiomyopathy: Secondary | ICD-10-CM

## 2014-02-17 DIAGNOSIS — Z95810 Presence of automatic (implantable) cardiac defibrillator: Secondary | ICD-10-CM

## 2014-02-17 LAB — CBC WITH DIFFERENTIAL/PLATELET
Basophils Absolute: 0.1 10*3/uL (ref 0.0–0.1)
Basophils Relative: 1.4 % (ref 0.0–3.0)
Eosinophils Absolute: 0.2 10*3/uL (ref 0.0–0.7)
Eosinophils Relative: 4.6 % (ref 0.0–5.0)
HCT: 41.7 % (ref 39.0–52.0)
Hemoglobin: 13.9 g/dL (ref 13.0–17.0)
Lymphocytes Relative: 23.5 % (ref 12.0–46.0)
Lymphs Abs: 1.1 10*3/uL (ref 0.7–4.0)
MCHC: 33.5 g/dL (ref 30.0–36.0)
MCV: 91.4 fl (ref 78.0–100.0)
MONO ABS: 0.4 10*3/uL (ref 0.1–1.0)
Monocytes Relative: 9 % (ref 3.0–12.0)
Neutro Abs: 2.9 10*3/uL (ref 1.4–7.7)
Neutrophils Relative %: 61.5 % (ref 43.0–77.0)
PLATELETS: 206 10*3/uL (ref 150.0–400.0)
RBC: 4.56 Mil/uL (ref 4.22–5.81)
RDW: 14 % (ref 11.5–15.5)
WBC: 4.6 10*3/uL (ref 4.0–10.5)

## 2014-02-17 LAB — BASIC METABOLIC PANEL
BUN: 17 mg/dL (ref 6–23)
CO2: 31 mEq/L (ref 19–32)
Calcium: 8.4 mg/dL (ref 8.4–10.5)
Chloride: 103 mEq/L (ref 96–112)
Creatinine, Ser: 1.1 mg/dL (ref 0.40–1.50)
GFR: 86.2 mL/min (ref >60.00)
Glucose, Bld: 115 mg/dL — ABNORMAL HIGH (ref 70–99)
Potassium: 3.8 mEq/L (ref 3.5–5.1)
Sodium: 138 mEq/L (ref 135–145)

## 2014-02-17 MED ORDER — SACUBITRIL-VALSARTAN 24-26 MG PO TABS: 1.0000 | ORAL_TABLET | Freq: Two times a day (BID) | ORAL | Status: DC

## 2014-02-17 NOTE — Patient Instructions (Addendum)
Stop taking the Entresto 49/51 mg dose.  DO NOT TAKE ENTRESTO for 3 days.  Check BP at home over the next 3 days.  Call on Thursday morning with your BP readings.  I have given you sample of Entresto 24/26 mg.  DO NOT TAKE it until you hear back from me on Thursday morning.  If your BP machine is not working, we will arrange a BP check with the nurse on Thursday.  Schedule a follow up appointment with Richardson Dopp, PA-C ON 03/03/14 @ 2:40  Labs today:  BMET.

## 2014-02-17 NOTE — Progress Notes (Signed)
Cardiology Office Note   Date:  02/17/2014   ID:  [name], DOB 05/11/1950, MRN [id number]  PCP:  Patricia Nettle, MD  Cardiologist/Electrophysiologist:  Dr. Virl Axe   Chief Complaint  Patient presents with  . Cardiomyopathy    Follow Up  . Congestive Heart Failure    Follow up     History of Present Illness: [name] is a 64 y.o. male with a hx of CAD s/p BMS to LAD, ischemic cardiomyopathy, systolic HF, status post AICD in 1997 with attempted upgrade to CRT-D in 2009 (unable to place LV lead) and 10/2013 (unsuccessful secondary to significant scarring in the left extrathoracic subclavian vein and innominate vein - could not deliver LV lead), paroxysmal VT treated with Sotalol, HL, Myasthenia Gravis.  Last seen by Dr. Virl Axe 01/21/14.  Ramipril was stopped and Entresto was started.    The patient returns for FU.  He is doing well.  He denies chest pain, orthopnea, PND, edema.  He notes dyspnea with more extreme activities.  He is NYHA 2-2b.  He denies syncope, near syncope or lightheadedness.  He denies fatigue or significant weakness.    Studies/Reports Reviewed Today:  Echocardiogram 09/30/13 - EF 30-35% with akinesis of the inferior/inferoseptal walls.; hypokinesis of the lateral and posterior walls.  - Grade 1 diastolic dysfunction - Mild AI - Mild MR  MUGA 03/13/13 EF 40%.  Cardiac Catheterization 02/21/12 LM: Normal LAD: Diffuse 20-30% proximal disease. LCx: Less than 20 % disease proximally.  RCA: 40% stenosis in the proximal vessel. 20-30% focal disease in the mid vessel. EF 40-45%. No mitral insufficiency  Nuclear Stress Test 08/23/11 Scar with peri-infarct ischemia affecting the entire septum. There is a corresponding wall motion abnormality. This is a moderate risk scan.  LV Ejection Fraction: 42%.   Past Medical History  Diagnosis Date  . Chronic back pain     a. s/p multiple lumbar surgeries  . GERD (gastroesophageal reflux  disease)   . Hyperlipidemia   . Myasthenia gravis   . Monomorphic ventricular tachycardia     a. s/p ICD 1997;   b. 06/2007 ICD upgrade to MDT Concerto Bi-V though LV lead unable to be placed.  . Complete heart block     a. h/o transient complete heart block in April 2009  . Sciatica   . DJD (degenerative joint disease)   . Ischemic cardiomyopathy     a. 05/2009 Echo: EF 40%, mild to mod glob HK, Gr 2 dd, mild LVH, Triv AI, Mild MR, PASP 37-59mmHg  . CAD (coronary artery disease)     a. s/p PCI/BMS of prox LAD in 1997;  b. 4/10 LHC: nonobs dzs. (pRCA 50-70%);  c.   Myoview 08/23/11 : Scar with peri-infarct ischemia infecting the entire septum with a corresponding wall motion abnormality, moderate risk scan, EF 42%.    Past Surgical History  Procedure Laterality Date  . Cardiac defibrillator placement  2001; 2008    single chamber MDT ICD implanted for secondary prevention; gen change 2008; explantation 2009  . Coronary angioplasty with stent placement      bare metal stenting of the LAD in 1997  . Anterior lumbar interbody fusion  04/23/2007    L4-L5   . Gallbladder surgery    . Implantable cardioverter defibrillator implant  2009; 2015    MDT dual chamber right sided device implanted 2009 with gen change 2015; unable to place LV lead  . Bi-ventricular implantable cardioverter defibrillator upgrade N/A 04/10/2013  Procedure: BI-VENTRICULAR IMPLANTABLE CARDIOVERTER DEFIBRILLATOR UPGRADE;  Surgeon: Deboraha Sprang, MD;  Location[facility] CATH LAB;  Service: Cardiovascular;  Laterality: N/A;  . Bi-ventricular pacemaker upgrade N/A 10/14/2013    Procedure: BI-VENTRICULAR PACEMAKER UPGRADE;  Surgeon: Deboraha Sprang, MD;  Location[facility] CATH LAB;  Service: Cardiovascular;  Laterality: N/A;     Current Outpatient Prescriptions  Medication Sig Dispense Refill  . aspirin EC 81 MG tablet Take 1 tablet (81 mg total) by mouth daily.    Marland Kitchen azaTHIOprine (IMURAN) 50 MG tablet Take 150 mg by mouth daily.       Marland Kitchen BIDIL 20-37.5 MG per tablet TAKE ONE TABLET BY MOUTH TWICE DAILY 180 tablet 0  . carvedilol (COREG) 3.125 MG tablet Take 1 tablet (3.125 mg total) by mouth 2 (two) times daily. 60 tablet 3  . furosemide (LASIX) 40 MG tablet Take 60 mg by mouth daily.    Marland Kitchen HYDROcodone-acetaminophen (NORCO) 10-325 MG per tablet Take 1 tablet by mouth every 6 (six) hours as needed for severe pain.    Marland Kitchen ipratropium (ATROVENT) 0.03 % nasal spray Place 2 sprays into both nostrils 3 (three) times daily as needed for rhinitis.    Marland Kitchen isosorbide-hydrALAZINE (BIDIL) 20-37.5 MG per tablet Take 1 tablet by mouth 2 (two) times daily.    . magnesium oxide (MAG-OX) 400 MG tablet Take 400 mg by mouth daily.     . Nutritional Supplements (GRAPESEED EXTRACT PO) Take 2 capsules by mouth daily.    Marland Kitchen omeprazole (PRILOSEC) 40 MG capsule Take 40 mg by mouth daily.    Marland Kitchen OVER THE COUNTER MEDICATION Take 1 tablet by mouth daily. Blue green algae    . OVER THE COUNTER MEDICATION Take 2 tablets by mouth daily. Digestive enzymes    . Polyvinyl Alcohol-Povidone (REFRESH OP) Place 1 drop into both eyes daily as needed (red eyes).    . potassium chloride SA (K-DUR,KLOR-CON) 20 MEQ tablet Take 20 mEq by mouth daily at 12 noon.     . predniSONE (DELTASONE) 5 MG tablet Take 5 mg by mouth every other day.      . sacubitril-valsartan (ENTRESTO) 49-51 MG Take 1 tablet by mouth 2 (two) times daily. 60 tablet 0  . simvastatin (ZOCOR) 20 MG tablet Take 20 mg by mouth at bedtime.     . simvastatin (ZOCOR) 20 MG tablet TAKE ONE TABLET BY MOUTH ONCE DAILY AT BEDTIME ( NEEDS APPOINTMENT) 90 tablet 3  . sotalol (BETAPACE) 120 MG tablet Take 120 mg by mouth 2 (two) times daily.    . sotalol (BETAPACE) 120 MG tablet TAKE ONE TABLET BY MOUTH TWICE DAILY ( PATIENT NEEDS APPOINTMENT) 180 tablet 0   No current facility-administered medications for this visit.    Allergies:   Procainamide and Quinidine    Social History:  The patient  reports that he quit  smoking about 32 years ago. His smoking use included Cigarettes. He has a 1.8 pack-year smoking history. He has never used smokeless tobacco. He reports that he drinks alcohol. He reports that he does not use illicit drugs.   Family History:  The patient's family history includes Coronary artery disease in his brother; Hypertension in his brother. There is no history of Diabetes or Stroke.    ROS:  Please see the history of present illness.   Otherwise, review of systems are positive for decreased visual acuity.   All other systems are reviewed and negative.    PHYSICAL EXAM: VS:  BP 70/50 mmHg  Pulse 77  Ht 5' 8.5" (1.74 m)  Wt 187 lb (84.823 kg)  BMI 28.02 kg/m2    Wt Readings from Last 3 Encounters:  01/21/14 189 lb 9.6 oz (86.002 kg)  10/14/13 183 lb (83.008 kg)  09/24/13 189 lb 6.4 oz (85.911 kg)     GEN: Well nourished, well developed, in no acute distress HEENT: normal Neck: no JVD, no carotid bruits, no masses Cardiac:  Normal S1/S2, RRR; no murmur, no rubs or gallops, no edema  Respiratory:  clear to auscultation bilaterally, no wheezing, rhonchi or rales. GI: soft, nontender, nondistended, + BS MS: no deformity or atrophy Skin: warm and dry  Neuro:  CNs II-XII intact, Strength and sensation are intact Psych: Normal affect   EKG:  EKG is ordered today.  It demonstrates:   V paced, HR 77, no change from prior tracing.    Recent Labs: 08/17/2013: Pro B Natriuretic peptide (BNP) 198.7* 10/11/2013: BUN 17; Creatinine 1.1; Hemoglobin 14.2; Platelets 215.0; Potassium 4.0; Sodium 137 01/21/2014: Magnesium 1.9    Lipid Panel    Component Value Date/Time   CHOL 173 01/19/2011 0858   TRIG 99.0 01/19/2011 0858   HDL 49.50 01/19/2011 0858   CHOLHDL 3 01/19/2011 0858   VLDL 19.8 01/19/2011 0858   LDLCALC 104* 01/19/2011 0858      ASSESSMENT AND PLAN:  1.  Ischemic cardiomyopathy:  His BP has dropped dramatically since starting Entresto.  He is not symptomatic.  He is  unaware that his BP is so low.  I checked his BP in both arms and it is equal.      -  DC Entresto 49/51.    -  Hold Entresto for 3 days.    -  Check BP later this week and start Entresto 24/26 mg bid if BP is ok.    -  Labs today:  BMET, CBC. 2.  Chronic systolic CHF (congestive heart failure):  He is NYHA 2-2b.  Continue current dose of Coreg, BiDil.  Adjust dose of Sacubitril/Valsartan as noted.   3.  Coronary artery disease:  No angina.  Continue ASA, Coreg, nitrates, Zocor. 4.  VENTRICULAR TACHYCARDIA:  Recent K+ and Mg2+ ok.  Continue Sotalol. 5.  S/p Automatic implantable cardioverter-defibrillator:  FU with Dr. Virl Axe as planned.  6.  HLD (hyperlipidemia):  Continue Zocor.   Current medicines are reviewed at length with the patient today.  The patient does not have concerns regarding medicines.  The following changes have been made:  As above.   Labs/ tests ordered today include:  Orders Placed This Encounter  Procedures  . Basic Metabolic Panel (BMET)  . CBC with Differential/Platelet  . EKG 12-Lead     Disposition:   FU with me in 2 weeks   Signed, [name], MHS 02/17/2014 11:37 AM    [facility] [address], [place], [place]  [address] Phone: [phone]; Fax: [phone]

## 2014-02-18 ENCOUNTER — Telehealth: Payer: Self-pay | Admitting: *Deleted

## 2014-02-18 NOTE — Telephone Encounter (Signed)
pt notified about lab results with verbal understanding  

## 2014-02-20 ENCOUNTER — Telehealth: Payer: Self-pay | Admitting: Physician Assistant

## 2014-02-20 NOTE — Telephone Encounter (Signed)
New message     Pt c/o BP issue: STAT if pt c/o blurred vision, one-sided weakness or slurred speech  1. What are your last 5 BP readings? tues 117/60 HR 82; wed 130/76 HR 86; thur 132/78 HR 88 2. Are you having any other symptoms (ex. Dizziness, headache, blurred vision, passed out)?  no  3. What is your BP issue? Pt is calling to give bp readings. Pt is taking enstresto.

## 2014-02-20 NOTE — Telephone Encounter (Signed)
Called patient, patient verbalized understanding.

## 2014-02-20 NOTE — Telephone Encounter (Signed)
Blood pressure has improved on lower dose of Entresto. Continue current therapy. Richardson Dopp, PA-C   02/20/2014 5:12 PM

## 2014-02-20 NOTE — Telephone Encounter (Signed)
Left message. Will forward readings to [employer] PA.

## 2014-03-03 ENCOUNTER — Encounter: Payer: Medicare Other | Admitting: Physician Assistant

## 2014-03-03 NOTE — Progress Notes (Signed)
This encounter was created in error - please disregard.

## 2014-03-03 NOTE — Progress Notes (Deleted)
Cardiology Office Note   Date:  03/03/2014   ID:  [name], DOB [birth year], MRN [id number]  PCP:  Patricia Nettle, MD  Cardiologist/Electrophysiologist:  Dr. Virl Axe    No chief complaint on file.   History of Present Illness: [name] is a 64 y.o. male with a hx of CAD s/p BMS to LAD, ischemic cardiomyopathy, systolic HF, status post AICD in 1997 with attempted upgrade to CRT-D in 2009 (unable to place LV lead) and 10/2013 (unsuccessful secondary to significant scarring in the left extrathoracic subclavian vein and innominate vein - could not deliver LV lead), paroxysmal VT treated with Sotalol, HL, Myasthenia Gravis.  Last seen by Dr. Virl Axe 01/21/14.  Ramipril was stopped and Entresto was started.    I saw him on 02/17/13.  He was overall doing well. However, his blood pressure was significantly depressed. I changed his Entresto from 49/51 to 24/26 mg twice a day. He returns for close follow-up.  ***   Studies/Reports Reviewed Today:  Echocardiogram 09/30/13 - EF 30-35% with akinesis of the inferior/inferoseptal walls.; hypokinesis of the lateral and posterior walls.  - Grade 1 diastolic dysfunction - Mild AI - Mild MR  MUGA 03/13/13 EF 40%.  Cardiac Catheterization 02/21/12 LM: Normal LAD: Diffuse 20-30% proximal disease. LCx: Less than 20 % disease proximally.  RCA: 40% stenosis in the proximal vessel. 20-30% focal disease in the mid vessel. EF 40-45%. No mitral insufficiency  Nuclear Stress Test 08/23/11 Scar with peri-infarct ischemia affecting the entire septum. There is a corresponding wall motion abnormality. This is a moderate risk scan.  LV Ejection Fraction: 42%.   Past Medical History  Diagnosis Date  . Chronic back pain     a. s/p multiple lumbar surgeries  . GERD (gastroesophageal reflux disease)   . Hyperlipidemia   . Myasthenia gravis   . Monomorphic ventricular tachycardia     a. s/p ICD 1997;   b. 06/2007 ICD upgrade to  MDT Concerto Bi-V though LV lead unable to be placed.  . Complete heart block     a. h/o transient complete heart block in April 2009  . Sciatica   . DJD (degenerative joint disease)   . Ischemic cardiomyopathy     a. 05/2009 Echo: EF 40%, mild to mod glob HK, Gr 2 dd, mild LVH, Triv AI, Mild MR, PASP 37-58mmHg  . CAD (coronary artery disease)     a. s/p PCI/BMS of prox LAD in 1997;  b. 4/10 LHC: nonobs dzs. (pRCA 50-70%);  c.   Myoview 08/23/11 : Scar with peri-infarct ischemia infecting the entire septum with a corresponding wall motion abnormality, moderate risk scan, EF 42%.    Past Surgical History  Procedure Laterality Date  . Cardiac defibrillator placement  2001; 2008    single chamber MDT ICD implanted for secondary prevention; gen change 2008; explantation 2009  . Coronary angioplasty with stent placement      bare metal stenting of the LAD in 1997  . Anterior lumbar interbody fusion  04/23/2007    L4-L5   . Gallbladder surgery    . Implantable cardioverter defibrillator implant  2009; 2015    MDT dual chamber right sided device implanted 2009 with gen change 2015; unable to place LV lead  . Bi-ventricular implantable cardioverter defibrillator upgrade N/A 04/10/2013    Procedure: BI-VENTRICULAR IMPLANTABLE CARDIOVERTER DEFIBRILLATOR UPGRADE;  Surgeon: Deboraha Sprang, MD;  Location: [facility] CATH LAB;  Service: Cardiovascular;  Laterality: N/A;  . Bi-ventricular pacemaker  upgrade N/A 10/14/2013    Procedure: BI-VENTRICULAR PACEMAKER UPGRADE;  Surgeon: Deboraha Sprang, MD;  Location: [facility] C[facility] LAB;  Service: Cardiovascular;  Laterality: N/A;     Current Outpatient Prescriptions  Medication Sig Dispense Refill  . aspirin EC 81 MG tablet Take 1 tablet (81 mg total) by mouth daily.    Marland Kitchen azaTHIOprine (IMURAN) 50 MG tablet Take 150 mg by mouth daily.     . carvedilol (COREG) 3.125 MG tablet Take 1 tablet (3.125 mg total) by mouth 2 (two) times daily. 60 tablet 3  . furosemide (LASIX) 40 MG  tablet Take 60 mg by mouth daily.    Marland Kitchen HYDROcodone-acetaminophen (NORCO) 10-325 MG per tablet Take 1 tablet by mouth every 6 (six) hours as needed for severe pain.    Marland Kitchen ipratropium (ATROVENT) 0.03 % nasal spray Place 2 sprays into both nostrils 3 (three) times daily as needed for rhinitis.    Marland Kitchen isosorbide-hydrALAZINE (BIDIL) 20-37.5 MG per tablet Take 1 tablet by mouth 2 (two) times daily.    . magnesium oxide (MAG-OX) 400 MG tablet Take 400 mg by mouth daily.     . Nutritional Supplements (GRAPESEED EXTRACT PO) Take 2 capsules by mouth daily.    Marland Kitchen omeprazole (PRILOSEC) 40 MG capsule Take 40 mg by mouth daily.    Marland Kitchen OVER THE COUNTER MEDICATION Take 1 tablet by mouth daily. Blue green algae    . OVER THE COUNTER MEDICATION Take 2 tablets by mouth daily. Digestive enzymes    . Polyvinyl Alcohol-Povidone (REFRESH OP) Place 1 drop into both eyes daily as needed (red eyes).    . potassium chloride SA (K-DUR,KLOR-CON) 20 MEQ tablet Take 20 mEq by mouth daily at 12 noon.     . predniSONE (DELTASONE) 5 MG tablet Take 5 mg by mouth every other day.      . sacubitril-valsartan (ENTRESTO) 24-26 MG Take 1 tablet by mouth 2 (two) times daily.    . simvastatin (ZOCOR) 20 MG tablet TAKE ONE TABLET BY MOUTH ONCE DAILY AT BEDTIME ( NEEDS APPOINTMENT) 90 tablet 3  . sotalol (BETAPACE) 120 MG tablet TAKE ONE TABLET BY MOUTH TWICE DAILY ( PATIENT NEEDS APPOINTMENT) 180 tablet 0   No current facility-administered medications for this visit.    Allergies:   Procainamide and Quinidine    Social History:  The patient  reports that he quit smoking about 32 years ago. His smoking use included Cigarettes. He has a 1.8 pack-year smoking history. He has never used smokeless tobacco. He reports that he drinks alcohol. He reports that he does not use illicit drugs.   Family History:  The patient's family history includes Coronary artery disease in his brother; Hypertension in his brother. There is no history of Diabetes or  Stroke.    ROS:  Please see the history of present illness.    ROS   PHYSICAL EXAM: VS:  There were no vitals taken for this visit.    Wt Readings from Last 3 Encounters:  02/17/14 187 lb (84.823 kg)  01/21/14 189 lb 9.6 oz (86.002 kg)  10/14/13 183 lb (83.008 kg)     GEN: Well nourished, well developed, in no acute distress HEENT: normal Neck: *** JVD, no carotid bruits, no masses Cardiac:  Normal S1/S2, RRR; *** murmur, no rubs or gallops, no edema  Respiratory:  ***clear to auscultation bilaterally, no wheezing, rhonchi or rales. GI: soft, nontender, nondistended, + BS MS: no deformity or atrophy Skin: warm and dry  Neuro:  CNs II-XII intact, Strength and sensation are intact Psych: Normal affect   EKG:  EKG is ordered today.  It demonstrates:  ***   Recent Labs: 08/17/2013: Pro B Natriuretic peptide (BNP) 198.7* 01/21/2014: Magnesium 1.9 02/17/2014: BUN 17; Creatinine 1.10; Hemoglobin 13.9; Platelets 206.0; Potassium 3.8; Sodium 138    Lipid Panel    Component Value Date/Time   CHOL 173 01/19/2011 0858   TRIG 99.0 01/19/2011 0858   HDL 49.50 01/19/2011 0858   CHOLHDL 3 01/19/2011 0858   VLDL 19.8 01/19/2011 0858   LDLCALC 104* 01/19/2011 0858      ASSESSMENT AND PLAN:  1.  Ischemic cardiomyopathy:  *** 2.  Chronic systolic CHF (congestive heart failure):  He is NYHA 2-2b.  Continue current dose of Coreg, BiDil, Entresto.  *** 3.  Coronary artery disease:  No angina.  Continue ASA, Coreg, nitrates, Zocor.*** 4.  VENTRICULAR TACHYCARDIA:  Recent K+ and Mg2+ ok.  Continue Sotalol.*** 5.  S/p Automatic implantable cardioverter-defibrillator:  ***FU with Dr. Virl Axe as planned.  6.  HLD (hyperlipidemia):  Continue Zocor.***   Current medicines are reviewed at length with the patient today.  The patient *** concerns regarding medicines.  The following changes have been made: ***  Labs/ tests ordered today include: *** No orders of the defined types were  placed in this encounter.     Disposition:   FU with ***  Signed, Richardson Dopp, PA-C, MHS 03/03/2014 2:09 PM    [facility] [address], [place], [place]  [address] Phone: [phone]; Fax: [phone]

## 2014-03-28 ENCOUNTER — Other Ambulatory Visit: Payer: Self-pay | Admitting: Internal Medicine

## 2014-03-31 ENCOUNTER — Other Ambulatory Visit: Payer: Self-pay | Admitting: Internal Medicine

## 2014-04-03 ENCOUNTER — Telehealth: Payer: Self-pay | Admitting: Physician Assistant

## 2014-04-03 NOTE — Telephone Encounter (Signed)
New message     Pt c/o medication issue:  1. Name of Medication:crestor  2. How are you currently taking this medication (dosage and times per day)?  3. Are you having a reaction (difficulty breathing--STAT)?no  4. What is your medication issue? Pt took last sample of crestor last night.  His ins will not pay for it.  He want to switch to something cheaper.

## 2014-04-03 NOTE — Telephone Encounter (Signed)
lmtcb

## 2014-04-06 NOTE — Telephone Encounter (Signed)
Change to Atorvastatin 40 mg Once daily  Check Lipids and LFTs in 6-8 weeks. Richardson Dopp, PA-C   04/06/2014 5:11 PM

## 2014-04-07 NOTE — Telephone Encounter (Signed)
cb pt to let him know that he could change over to atorvastatin from crestor. Pt said his call was about Entresto not crestor. I aplologized message was taken incorrectly. I asked pt his question about the Entresto. Pt states insurance will not cover. I advised pt that I will d/w Brynda Rim. PA to see if we need to change to a different medication. I d/w Brynda Rim. PA and he advised for me to contact the Entresto rep to let them know pt has not been approved for this medication. I then sent an email today to [employer] with [employer]. Cecilie Lowers had stated recently while he had been in the office introducing Entresto to our office that if a pt's insurance does not cover the medication to let him know so that they may see what can be done about getting pt approved for Entresto. I am still waiting to hear back from Conrad on this matter.

## 2014-04-07 NOTE — Telephone Encounter (Signed)
tried tcb pt on his phone of [phone], recording states either disconnected or changed. I then lmom of pt's sister's phone to have ptcb to discuss about the change from crestor to atorvastatin.

## 2014-04-11 NOTE — Telephone Encounter (Signed)
I s/w pt to let him know I had been conversing with the [facility] rep as to why he was denied coverage. I advised pt that Entresto rep coming into our office Tuesday 4/12. Samples placed at front desk pt aware.

## 2014-04-15 NOTE — Telephone Encounter (Signed)
Delene Loll Rep Cecilie Lowers came in the office today to help see why pt's insurance denied Entresto. We decided to send in a prior auth though the pt's insurance [employer] should already have approved med though they did not. I sent in prior auth today.

## 2014-04-20 ENCOUNTER — Other Ambulatory Visit: Payer: Self-pay | Admitting: Internal Medicine

## 2014-04-22 ENCOUNTER — Telehealth: Payer: Self-pay | Admitting: Cardiology

## 2014-04-22 ENCOUNTER — Ambulatory Visit (INDEPENDENT_AMBULATORY_CARE_PROVIDER_SITE_OTHER): Payer: Medicare Other | Admitting: *Deleted

## 2014-04-22 ENCOUNTER — Telehealth: Payer: Self-pay | Admitting: Internal Medicine

## 2014-04-22 DIAGNOSIS — I5022 Chronic systolic (congestive) heart failure: Secondary | ICD-10-CM

## 2014-04-22 DIAGNOSIS — I255 Ischemic cardiomyopathy: Secondary | ICD-10-CM

## 2014-04-22 LAB — MDC_IDC_ENUM_SESS_TYPE_REMOTE
Brady Statistic AP VS Percent: 0 %
Brady Statistic AS VS Percent: 2.06 %
Brady Statistic RA Percent Paced: 0.97 %
Date Time Interrogation Session: 20160420120234
HighPow Impedance: 43 Ohm
HighPow Impedance: 49 Ohm
Lead Channel Impedance Value: 266 Ohm
Lead Channel Impedance Value: 342 Ohm
Lead Channel Impedance Value: 4047 Ohm
Lead Channel Impedance Value: 4047 Ohm
Lead Channel Impedance Value: 4047 Ohm
Lead Channel Impedance Value: 4047 Ohm
Lead Channel Impedance Value: 4047 Ohm
Lead Channel Impedance Value: 4047 Ohm
Lead Channel Pacing Threshold Amplitude: 1.75 V
Lead Channel Pacing Threshold Pulse Width: 0.4 ms
Lead Channel Sensing Intrinsic Amplitude: 2.5 mV
Lead Channel Sensing Intrinsic Amplitude: 4.375 mV
Lead Channel Setting Pacing Amplitude: 2.5 V
Lead Channel Setting Pacing Amplitude: 3.25 V
Lead Channel Setting Pacing Pulse Width: 0.4 ms
Lead Channel Setting Sensing Sensitivity: 0.3 mV
MDC IDC MSMT BATTERY REMAINING LONGEVITY: 87 mo
MDC IDC MSMT BATTERY VOLTAGE: 2.98 V
MDC IDC MSMT LEADCHNL LV IMPEDANCE VALUE: 4047 Ohm
MDC IDC MSMT LEADCHNL LV IMPEDANCE VALUE: 4047 Ohm
MDC IDC MSMT LEADCHNL LV IMPEDANCE VALUE: 4047 Ohm
MDC IDC MSMT LEADCHNL LV IMPEDANCE VALUE: 4047 Ohm
MDC IDC MSMT LEADCHNL RA IMPEDANCE VALUE: 399 Ohm
MDC IDC MSMT LEADCHNL RA PACING THRESHOLD PULSEWIDTH: 0.4 ms
MDC IDC MSMT LEADCHNL RA SENSING INTR AMPL: 2.5 mV
MDC IDC MSMT LEADCHNL RV PACING THRESHOLD AMPLITUDE: 0.625 V
MDC IDC SET ZONE DETECTION INTERVAL: 350 ms
MDC IDC SET ZONE DETECTION INTERVAL: 360 ms
MDC IDC STAT BRADY AP VP PERCENT: 0.97 %
MDC IDC STAT BRADY AS VP PERCENT: 96.97 %
MDC IDC STAT BRADY RV PERCENT PACED: 98.15 %
Zone Setting Detection Interval: 250 ms
Zone Setting Detection Interval: 300 ms
Zone Setting Detection Interval: 380 ms

## 2014-04-22 NOTE — Telephone Encounter (Signed)
Instructed pt how to send manual transmission.

## 2014-04-22 NOTE — Telephone Encounter (Signed)
New message      Did you call this pt regarding his device check?

## 2014-04-22 NOTE — Telephone Encounter (Signed)
LMOVM reminding pt to send remote transmission.   

## 2014-04-23 DIAGNOSIS — I255 Ischemic cardiomyopathy: Secondary | ICD-10-CM | POA: Diagnosis not present

## 2014-04-23 DIAGNOSIS — I5022 Chronic systolic (congestive) heart failure: Secondary | ICD-10-CM | POA: Diagnosis not present

## 2014-04-23 NOTE — Progress Notes (Signed)
Remote ICD transmission.   

## 2014-04-25 ENCOUNTER — Telehealth: Payer: Self-pay | Admitting: Cardiology

## 2014-04-25 NOTE — Telephone Encounter (Signed)
No documentation noted in chart that patient was called by anyone in triage or anywhere else.

## 2014-04-25 NOTE — Telephone Encounter (Signed)
Pt returning a call but doesn't know who to speak w/.

## 2014-05-02 MED ORDER — SACUBITRIL-VALSARTAN 24-26 MG PO TABS: 1.0000 | ORAL_TABLET | Freq: Two times a day (BID) | ORAL | Status: DC

## 2014-05-02 NOTE — Addendum Note (Signed)
Addended by: Michae Kava on: 05/02/2014 11:58 AM   Modules accepted: Orders

## 2014-05-02 NOTE — Telephone Encounter (Signed)
Pt notified today that he has been finally approved for the [facility]; that I just recieved information from [employer] he has been approved. New Rx called in today to [place] [facility].

## 2014-05-23 ENCOUNTER — Encounter: Payer: Self-pay | Admitting: Cardiology

## 2014-05-27 ENCOUNTER — Encounter: Payer: Self-pay | Admitting: Internal Medicine

## 2014-06-26 ENCOUNTER — Other Ambulatory Visit: Payer: Self-pay | Admitting: Internal Medicine

## 2014-06-30 ENCOUNTER — Other Ambulatory Visit: Payer: Self-pay | Admitting: Internal Medicine

## 2014-07-08 ENCOUNTER — Emergency Department (HOSPITAL_COMMUNITY)
Admission: EM | Admit: 2014-07-08 | Discharge: 2014-07-08 | Disposition: A | Discharge status: Home / Self Care | Payer: Medicare Other | Attending: Emergency Medicine | Admitting: Emergency Medicine

## 2014-07-08 ENCOUNTER — Encounter (HOSPITAL_COMMUNITY): Payer: Self-pay | Admitting: Cardiology

## 2014-07-08 ENCOUNTER — Emergency Department (HOSPITAL_COMMUNITY): Payer: Medicare Other

## 2014-07-08 DIAGNOSIS — K219 Gastro-esophageal reflux disease without esophagitis: Secondary | ICD-10-CM | POA: Insufficient documentation

## 2014-07-08 DIAGNOSIS — R42 Dizziness and giddiness: Secondary | ICD-10-CM | POA: Diagnosis present

## 2014-07-08 DIAGNOSIS — Z79899 Other long term (current) drug therapy: Secondary | ICD-10-CM | POA: Insufficient documentation

## 2014-07-08 DIAGNOSIS — G8929 Other chronic pain: Secondary | ICD-10-CM | POA: Insufficient documentation

## 2014-07-08 DIAGNOSIS — M1990 Unspecified osteoarthritis, unspecified site: Secondary | ICD-10-CM | POA: Diagnosis not present

## 2014-07-08 DIAGNOSIS — Z7951 Long term (current) use of inhaled steroids: Secondary | ICD-10-CM | POA: Insufficient documentation

## 2014-07-08 DIAGNOSIS — I2510 Atherosclerotic heart disease of native coronary artery without angina pectoris: Secondary | ICD-10-CM | POA: Diagnosis not present

## 2014-07-08 DIAGNOSIS — Z87891 Personal history of nicotine dependence: Secondary | ICD-10-CM | POA: Insufficient documentation

## 2014-07-08 DIAGNOSIS — G7000 Myasthenia gravis without (acute) exacerbation: Secondary | ICD-10-CM | POA: Diagnosis not present

## 2014-07-08 DIAGNOSIS — Z95810 Presence of automatic (implantable) cardiac defibrillator: Secondary | ICD-10-CM | POA: Diagnosis not present

## 2014-07-08 DIAGNOSIS — Z9861 Coronary angioplasty status: Secondary | ICD-10-CM | POA: Insufficient documentation

## 2014-07-08 DIAGNOSIS — E785 Hyperlipidemia, unspecified: Secondary | ICD-10-CM | POA: Diagnosis not present

## 2014-07-08 DIAGNOSIS — Z7982 Long term (current) use of aspirin: Secondary | ICD-10-CM | POA: Diagnosis not present

## 2014-07-08 LAB — BASIC METABOLIC PANEL
ANION GAP: 4 — AB (ref 5–15)
BUN: 15 mg/dL (ref 6–20)
CHLORIDE: 106 mmol/L (ref 101–111)
CO2: 29 mmol/L (ref 22–32)
Calcium: 8.9 mg/dL (ref 8.9–10.3)
Creatinine, Ser: 0.95 mg/dL (ref 0.61–1.24)
GFR calc non Af Amer: >60 mL/min (ref >60)
Glucose, Bld: 120 mg/dL — ABNORMAL HIGH (ref 65–99)
POTASSIUM: 3.9 mmol/L (ref 3.5–5.1)
SODIUM: 139 mmol/L (ref 135–145)

## 2014-07-08 LAB — CBC
HEMATOCRIT: 39.9 % (ref 39.0–52.0)
Hemoglobin: 13.1 g/dL (ref 13.0–17.0)
MCH: 31.3 pg (ref 26.0–34.0)
MCHC: 32.8 g/dL (ref 30.0–36.0)
MCV: 95.2 fL (ref 78.0–100.0)
Platelets: 181 10*3/uL (ref 150–400)
RBC: 4.19 MIL/uL — ABNORMAL LOW (ref 4.22–5.81)
RDW: 12.9 % (ref 11.5–15.5)
WBC: 5.9 10*3/uL (ref 4.0–10.5)

## 2014-07-08 LAB — I-STAT TROPONIN, ED: Troponin i, poc: 0 ng/mL (ref 0.00–0.08)

## 2014-07-08 MED ORDER — SODIUM CHLORIDE 0.9 % IV BOLUS (SEPSIS)
500.0000 mL | Freq: Once | INTRAVENOUS | Status: AC
  Administered 2014-07-08: 500 mL via INTRAVENOUS

## 2014-07-08 MED ORDER — LORAZEPAM 2 MG/ML IJ SOLN: 1.0000 mg | Freq: Once | INTRAMUSCULAR | Status: DC

## 2014-07-08 NOTE — ED Provider Notes (Signed)
CSN: [id number]     Arrival date & time 07/08/14  1256 History   First MD Initiated Contact with Patient 07/08/14 1302     Chief Complaint  Patient presents with  . Dizziness     (Consider location/radiation/quality/duration/timing/severity/associated sxs/prior Treatment) HPI [name] is a 64 y.o. male with history of known CAD with stents, ICD, comes in for evaluation of not feeling well. Patient states approximately one hour ago he was sitting down at a restaurant eating when he began to feel hot, light headed and sweaty. He denies any chest pain, shortness of breath, nausea or vomiting, abdominal pain, numbness or weakness. He reports associated sharp pain under "my left rib cage" that was fleeting. He states he has felt this pain before and equates it to gas pains, says it is not similar to his anginal pain. Denies any discomfort now in the ED states "I almost feel normal".  Past Medical History  Diagnosis Date  . Chronic back pain     a. s/p multiple lumbar surgeries  . GERD (gastroesophageal reflux disease)   . Hyperlipidemia   . Myasthenia gravis   . Monomorphic ventricular tachycardia     a. s/p ICD 1997;   b. 06/2007 ICD upgrade to MDT Concerto Bi-V though LV lead unable to be placed.  . Complete heart block     a. h/o transient complete heart block in April 2009  . Sciatica   . DJD (degenerative joint disease)   . Ischemic cardiomyopathy     a. 05/2009 Echo: EF 40%, mild to mod glob HK, Gr 2 dd, mild LVH, Triv AI, Mild MR, PASP 37-41mmHg  . CAD (coronary artery disease)     a. s/p PCI/BMS of prox LAD in 1997;  b. 4/10 LHC: nonobs dzs. (pRCA 50-70%);  c.   Myoview 08/23/11 : Scar with peri-infarct ischemia infecting the entire septum with a corresponding wall motion abnormality, moderate risk scan, EF 42%.   Past Surgical History  Procedure Laterality Date  . Cardiac defibrillator placement  2001; 2008    single chamber MDT ICD implanted for secondary prevention; gen  change 2008; explantation 2009  . Coronary angioplasty with stent placement      bare metal stenting of the LAD in 1997  . Anterior lumbar interbody fusion  04/23/2007    L4-L5   . Gallbladder surgery    . Implantable cardioverter defibrillator implant  2009; 2015    MDT dual chamber right sided device implanted 2009 with gen change 2015; unable to place LV lead  . Bi-ventricular implantable cardioverter defibrillator upgrade N/A 04/10/2013    Procedure: BI-VENTRICULAR IMPLANTABLE CARDIOVERTER DEFIBRILLATOR UPGRADE;  Surgeon: Deboraha Sprang, MD;  Location: [facility] CATH LAB;  Service: Cardiovascular;  Laterality: N/A;  . Bi-ventricular pacemaker upgrade N/A 10/14/2013    Procedure: BI-VENTRICULAR PACEMAKER UPGRADE;  Surgeon: Deboraha Sprang, MD;  Location: [facility] CATH LAB;  Service: Cardiovascular;  Laterality: N/A;   Family History  Problem Relation Age of Onset  . Coronary artery disease Brother   . Diabetes Neg Hx   . Stroke Neg Hx   . Hypertension Brother    History  Substance Use Topics  . Smoking status: Former Smoker -- 0.30 packs/day for 6 years    Types: Cigarettes    Quit date: 01/03/1982  . Smokeless tobacco: Never Used  . Alcohol Use: Yes     Comment: occasional    Review of Systems A 10 point review of systems was completed and was  negative except for pertinent positives and negatives as mentioned in the history of present illness     Allergies  Procainamide and Quinidine  Home Medications   Prior to Admission medications   Medication Sig Start Date End Date Taking? Authorizing Provider  aspirin EC 81 MG tablet Take 1 tablet (81 mg total) by mouth daily. 01/25/11  Yes Deboraha Sprang, MD  azaTHIOprine (IMURAN) 50 MG tablet Take 150 mg by mouth daily.    Yes Historical Provider, MD  carvedilol (COREG) 3.125 MG tablet Take 1 tablet (3.125 mg total) by mouth 2 (two) times daily. 09/24/13  Yes Deboraha Sprang, MD  diphenhydrAMINE (SOMINEX) 25 MG tablet Take 50 mg by mouth at  bedtime as needed for allergies or sleep.   Yes Historical Provider, MD  furosemide (LASIX) 40 MG tablet Take 60 mg by mouth daily.   Yes Historical Provider, MD  ipratropium (ATROVENT) 0.03 % nasal spray Place 2 sprays into both nostrils 3 (three) times daily as needed for rhinitis.   Yes Historical Provider, MD  isosorbide-hydrALAZINE (BIDIL) 20-37.5 MG per tablet Take 1 tablet by mouth 2 (two) times daily.   Yes Historical Provider, MD  magnesium oxide (MAG-OX) 400 MG tablet Take 400 mg by mouth daily.    Yes Historical Provider, MD  Nutritional Supplements (GRAPESEED EXTRACT PO) Take 2 capsules by mouth daily.   Yes Historical Provider, MD  omeprazole (PRILOSEC) 40 MG capsule Take 40 mg by mouth daily.   Yes Historical Provider, MD  OVER THE COUNTER MEDICATION Take 1 tablet by mouth daily. Blue green algae   Yes Historical Provider, MD  OVER THE COUNTER MEDICATION Take 2 tablets by mouth daily. Digestive enzymes   Yes Historical Provider, MD  Polyvinyl Alcohol-Povidone (REFRESH OP) Place 1 drop into both eyes daily as needed (red eyes).   Yes Historical Provider, MD  potassium chloride SA (K-DUR,KLOR-CON) 20 MEQ tablet Take 20 mEq by mouth daily at 12 noon.    Yes Historical Provider, MD  predniSONE (DELTASONE) 5 MG tablet Take 5 mg by mouth every other day.     Yes Historical Provider, MD  sacubitril-valsartan (ENTRESTO) 24-26 MG Take 1 tablet by mouth 2 (two) times daily. 05/02/14  Yes Scott Joylene Draft, PA-C  simvastatin (ZOCOR) 20 MG tablet Take 20 mg by mouth daily.   Yes Historical Provider, MD  sotalol (BETAPACE) 120 MG tablet Take 120 mg by mouth 2 (two) times daily.   Yes Historical Provider, MD  furosemide (LASIX) 40 MG tablet TAKE ONE & ONE-HALF TABLETS BY MOUTH ONCE DAILY ( NEEDS APPOINTMENT ) Patient not taking: Reported on 07/08/2014 04/02/14   Deboraha Sprang, MD  HYDROcodone-acetaminophen [facility]) 10-325 MG per tablet Take 1 tablet by mouth every 6 (six) hours as needed for severe pain.     Historical Provider, MD  simvastatin (ZOCOR) 20 MG tablet TAKE ONE TABLET BY MOUTH ONCE DAILY AT BEDTIME ( NEEDS APPOINTMENT) Patient not taking: Reported on 07/08/2014 02/12/14   Deboraha Sprang, MD  sotalol (BETAPACE) 120 MG tablet TAKE ONE TABLET BY MOUTH TWICE DAILY ( PATIENT NEEDS APPOINTMENT) Patient not taking: Reported on 07/08/2014 06/30/14   Deboraha Sprang, MD   BP 102/58 mmHg  Pulse 70  Temp(Src) 98.5 F (36.9 C)  Resp 16  Wt 187 lb (84.823 kg)  SpO2 99% Physical Exam  Constitutional: He is oriented to person, place, and time. He appears well-developed and well-nourished.  Patient appears very comfortable and answers questions appropriately. No  distress noted.  HENT:  Head: Normocephalic and atraumatic.  Mouth/Throat: Oropharynx is clear and moist.  Eyes: Conjunctivae are normal. Pupils are equal, round, and reactive to light. Right eye exhibits no discharge. Left eye exhibits no discharge. No scleral icterus.  Neck: Neck supple.  Cardiovascular: Normal rate, regular rhythm and normal heart sounds.   Pulmonary/Chest: Effort normal and breath sounds normal. No respiratory distress. He has no wheezes. He has no rales.  Abdominal: Soft. He exhibits no distension and no mass. There is no tenderness. There is no rebound and no guarding.  Musculoskeletal: He exhibits no edema or tenderness.  Neurological: He is alert and oriented to person, place, and time.  Cranial Nerves II-XII grossly intact  Skin: Skin is warm and dry. No rash noted.  Psychiatric: He has a normal mood and affect.  Nursing note and vitals reviewed.   ED Course  Procedures (including critical care time) Labs Review Labs Reviewed  BASIC METABOLIC PANEL - Abnormal; Notable for the following:    Glucose, Bld 120 (*)    Anion gap 4 (*)    All other components within normal limits  CBC - Abnormal; Notable for the following:    RBC 4.19 (*)    All other components within normal limits  I-STAT TROPOININ, ED     Imaging Review Dg Chest 2 View  07/08/2014   CLINICAL DATA:  Acute onset dizziness. History of cardiac arrhythmia  EXAM: CHEST  2 VIEW  COMPARISON:  August 17, 2013  FINDINGS: There is persistent elevation of the left hemidiaphragm. There is no edema or consolidation. The heart size and pulmonary vascularity are within normal limits. Pacemaker leads are attached to the right atrium and right ventricle, stable. No adenopathy. No bone lesions.  IMPRESSION: No edema or consolidation. No change in cardiac silhouette. Stable elevation of the left hemidiaphragm. No change in pacemaker lead positions.   Electronically Signed   By: Lowella Grip III M.D.   On: 07/08/2014 14:05     EKG Interpretation   Date/Time:  Tuesday July 08 2014 13:04:22 EDT Ventricular Rate:  73 PR Interval:  139 QRS Duration: 174 QT Interval:  475 QTC Calculation: 523 R Axis:   -14 Text Interpretation:  Sinus rhythm Atrial premature complex Left bundle  branch block Confirmed by DELO  MD, DOUGLAS ([id number]) on 07/08/2014 3:53:29 PM      Meds given in ED:  Medications  LORazepam (ATIVAN) injection 1 mg (not administered)  sodium chloride 0.9 % bolus 500 mL (0 mLs Intravenous Stopped 07/08/14 1533)    Discharge Medication List as of 07/08/2014  3:13 PM     Filed Vitals:   07/08/14 1308 07/08/14 1419 07/08/14 1445 07/08/14 1533  BP: 84/48 86/49 93/52  102/58  Pulse: 78 71 71 70  Temp: 98.5 F (36.9 C)     Resp: 18 17 16 16   Weight: 187 lb (84.823 kg)     SpO2: 99% 100% 99% 99%    MDM  Vitals stable - WNL -afebrile Pt resting comfortably in ED. denies any discomfort whatsoever now in the ED. Reports he feels much better and is ready to go home. PE--physical exam was not concerning shows no evidence of heart failure exacerbation. Ambulates throughout ED without any difficulty or discomfort. Labwork--troponin negative, labs Baseline, EKG unchanged from prior and not concerning. Imaging--chest x-ray shows no  acute cardiopulmonary pathology.  Patient is stable, in good condition and is appropriate for discharge. He is accompanied by his family members at  bedside who are comfortable with plan for discharge and report they will return if any sign of chest pain, shortness of breath, nausea or vomiting or diaphoresis.  I discussed all relevant lab findings and imaging results with pt and they verbalized understanding. Discussed f/u with PCP within 48 hrs and return precautions, pt very amenable to plan. Prior to patient discharge, I discussed and reviewed this case with Dr.Delo   Final diagnoses:  Lightheaded        Comer Locket, PA-C 07/08/14 [place], MD 07/09/14 [phone]

## 2014-07-08 NOTE — ED Notes (Addendum)
Pt to department via PTAR- pt reports he was sitting at a restaurant and began to feel hot and dizzy. Reports hx of pacemaker/defib. No chest pain or SOb. Reports left flank pain.  Bp-100/58 Hr-76 CBG-125

## 2014-07-08 NOTE — Discharge Instructions (Signed)
You were evaluated in the ED today for your dizziness. There is a emergent cause for her symptoms at this time.  Exam, labs, chest x-ray and EKG are all reassuring. It is important for you to follow-up with your PCP/cardiologist this week for further evaluation and management of your symptoms. Return to ED for new or worsening symptoms.  Dizziness Dizziness is a common problem. It is a feeling of unsteadiness or light-headedness. You may feel like you are about to faint. Dizziness can lead to injury if you stumble or fall. A person of any age group can suffer from dizziness, but dizziness is more common in older adults. CAUSES  Dizziness can be caused by many different things, including:  Middle ear problems.  Standing for too long.  Infections.  An allergic reaction.  Aging.  An emotional response to something, such as the sight of blood.  Side effects of medicines.  Tiredness.  Problems with circulation or blood pressure.  Excessive use of alcohol or medicines, or illegal drug use.  Breathing too fast (hyperventilation).  An irregular heart rhythm (arrhythmia).  A low red blood cell count (anemia).  Pregnancy.  Vomiting, diarrhea, fever, or other illnesses that cause body fluid loss (dehydration).  Diseases or conditions such as Parkinson's disease, high blood pressure (hypertension), diabetes, and thyroid problems.  Exposure to extreme heat. DIAGNOSIS  Your health care provider will ask about your symptoms, perform a physical exam, and perform an electrocardiogram (ECG) to record the electrical activity of your heart. Your health care provider may also perform other heart or blood tests to determine the cause of your dizziness. These may include:  Transthoracic echocardiogram (TTE). During echocardiography, sound waves are used to evaluate how blood flows through your heart.  Transesophageal echocardiogram (TEE).  Cardiac monitoring. This allows your health care  provider to monitor your heart rate and rhythm in real time.  Holter monitor. This is a portable device that records your heartbeat and can help diagnose heart arrhythmias. It allows your health care provider to track your heart activity for several days if needed.  Stress tests by exercise or by giving medicine that makes the heart beat faster. TREATMENT  Treatment of dizziness depends on the cause of your symptoms and can vary greatly. HOME CARE INSTRUCTIONS   Drink enough fluids to keep your urine clear or pale yellow. This is especially important in very hot weather. In older adults, it is also important in cold weather.  Take your medicine exactly as directed if your dizziness is caused by medicines. When taking blood pressure medicines, it is especially important to get up slowly.  Rise slowly from chairs and steady yourself until you feel okay.  In the morning, first sit up on the side of the bed. When you feel okay, stand slowly while holding onto something until you know your balance is fine.  Move your legs often if you need to stand in one place for a long time. Tighten and relax your muscles in your legs while standing.  Have someone stay with you for 1-2 days if dizziness continues to be a problem. Do this until you feel you are well enough to stay alone. Have the person call your health care provider if he or she notices changes in you that are concerning.  Do not drive or use heavy machinery if you feel dizzy.  Do not drink alcohol. SEEK IMMEDIATE MEDICAL CARE IF:   Your dizziness or light-headedness gets worse.  You feel nauseous or  vomit.  You have problems talking, walking, or using your arms, hands, or legs.  You feel weak.  You are not thinking clearly or you have trouble forming sentences. It may take a friend or family member to notice this.  You have chest pain, abdominal pain, shortness of breath, or sweating.  Your vision changes.  You notice any  bleeding.  You have side effects from medicine that seems to be getting worse rather than better. MAKE SURE YOU:   Understand these instructions.  Will watch your condition.  Will get help right away if you are not doing well or get worse. Document Released: 06/15/2000 Document Revised: 12/25/2012 Document Reviewed: 07/09/2010 [facility] Patient Information 2015 [place], [place]. This information is not intended to replace advice given to you by your health care provider. Make sure you discuss any questions you have with your health care provider.

## 2014-07-11 ENCOUNTER — Telehealth: Payer: Self-pay | Admitting: Internal Medicine

## 2014-07-11 NOTE — Telephone Encounter (Signed)
Patient scheduled to see Chanetta Marshall, NP on Monday.

## 2014-07-14 ENCOUNTER — Ambulatory Visit (INDEPENDENT_AMBULATORY_CARE_PROVIDER_SITE_OTHER): Payer: Medicare Other | Admitting: Nurse Practitioner

## 2014-07-14 ENCOUNTER — Encounter: Payer: Self-pay | Admitting: Internal Medicine

## 2014-07-14 ENCOUNTER — Encounter: Payer: Self-pay | Admitting: Nurse Practitioner

## 2014-07-14 VITALS — BP 90/60 | HR 85 | Ht 68.5 in | Wt 185.1 lb

## 2014-07-14 DIAGNOSIS — I442 Atrioventricular block, complete: Secondary | ICD-10-CM | POA: Diagnosis not present

## 2014-07-14 DIAGNOSIS — I472 Ventricular tachycardia, unspecified: Secondary | ICD-10-CM

## 2014-07-14 DIAGNOSIS — I5022 Chronic systolic (congestive) heart failure: Secondary | ICD-10-CM

## 2014-07-14 LAB — CUP PACEART INCLINIC DEVICE CHECK
Lead Channel Setting Pacing Amplitude: 2.5 V
Lead Channel Setting Pacing Amplitude: 3.25 V
Lead Channel Setting Pacing Pulse Width: 0.4 ms
Lead Channel Setting Sensing Sensitivity: 0.3 mV
MDC IDC SESS DTM: 20160711143636
MDC IDC SET ZONE DETECTION INTERVAL: 250 ms
MDC IDC SET ZONE DETECTION INTERVAL: 300 ms
Zone Setting Detection Interval: 350 ms
Zone Setting Detection Interval: 360 ms
Zone Setting Detection Interval: 380 ms

## 2014-07-14 MED ORDER — SOTALOL HCL 120 MG PO TABS: 120.0000 mg | ORAL_TABLET | Freq: Two times a day (BID) | ORAL | Status: DC

## 2014-07-14 MED ORDER — ISOSORB DINITRATE-HYDRALAZINE 20-37.5 MG PO TABS: 1.0000 | ORAL_TABLET | Freq: Two times a day (BID) | ORAL | Status: DC

## 2014-07-14 MED ORDER — SACUBITRIL-VALSARTAN 24-26 MG PO TABS: 1.0000 | ORAL_TABLET | Freq: Two times a day (BID) | ORAL | Status: DC

## 2014-07-14 MED ORDER — SIMVASTATIN 20 MG PO TABS: 20.0000 mg | ORAL_TABLET | Freq: Every day | ORAL | Status: DC

## 2014-07-14 MED ORDER — POTASSIUM CHLORIDE CRYS ER 20 MEQ PO TBCR: 20.0000 meq | EXTENDED_RELEASE_TABLET | Freq: Every day | ORAL | Status: DC

## 2014-07-14 MED ORDER — CARVEDILOL 3.125 MG PO TABS: 3.1250 mg | ORAL_TABLET | Freq: Two times a day (BID) | ORAL | Status: DC

## 2014-07-14 MED ORDER — FUROSEMIDE 40 MG PO TABS: ORAL_TABLET | ORAL | Status: DC

## 2014-07-14 NOTE — Progress Notes (Signed)
Electrophysiology Office Note Date: 07/14/2014  ID:  [name], DOB 1950/05/06, MRN [id number]  PCP: [name] Nettle, MD Electrophysiologist: [name]  CC: Follow up from recent ER visit  [name] is a 64 y.o. male seen today for Dr [name].  He presented to the ER last week with pre-syncope and was monitored and discharged.  He presents today for follow up. Since discharge from ER, the patient reports doing very well. He denies chest pain, palpitations, dyspnea, PND, orthopnea, nausea, vomiting, dizziness, syncope, edema, weight gain, or early satiety.  He has not had ICD shocks.   Device History: MDT dual chamber ICD implanted 2009 for ischemic cardiomyopathy; attempted CRTD upgrade 10/2013 with significant stenosis and inability to deploy LV lead History of appropriate therapy: Yes History of AAD therapy: Yes - Sotalol   Past Medical History  Diagnosis Date  . Chronic back pain     a. s/p multiple lumbar surgeries  . GERD (gastroesophageal reflux disease)   . Hyperlipidemia   . Myasthenia gravis   . Monomorphic ventricular tachycardia     a. s/p ICD 1997;   b. 06/2007 ICD upgrade to MDT Concerto Bi-V though LV lead unable to be placed.  . Complete heart block     a. h/o transient complete heart block in April 2009  . Sciatica   . DJD (degenerative joint disease)   . Ischemic cardiomyopathy     a. 05/2009 Echo: EF 40%, mild to mod glob HK, Gr 2 dd, mild LVH, Triv AI, Mild MR, PASP 37-91mmHg  . CAD (coronary artery disease)     a. s/p PCI/BMS of prox LAD in 1997;  b. 4/10 LHC: nonobs dzs. (pRCA 50-70%);  c.   Myoview 08/23/11 : Scar with peri-infarct ischemia infecting the entire septum with a corresponding wall motion abnormality, moderate risk scan, EF 42%.   Past Surgical History  Procedure Laterality Date  . Cardiac defibrillator placement  2001; 2008    single chamber MDT ICD implanted for secondary prevention; gen change 2008; explantation 2009  . Coronary  angioplasty with stent placement      bare metal stenting of the LAD in 1997  . Anterior lumbar interbody fusion  04/23/2007    L4-L5   . Gallbladder surgery    . Implantable cardioverter defibrillator implant  2009; 2015    MDT dual chamber right sided device implanted 2009 with gen change 2015; unable to place LV lead  . Bi-ventricular implantable cardioverter defibrillator upgrade N/A 04/10/2013    failed CRTD upgrade  . Bi-ventricular pacemaker upgrade N/A 10/14/2013    failed CRTD upgrade    Current Outpatient Prescriptions  Medication Sig Dispense Refill  . aspirin EC 81 MG tablet Take 1 tablet (81 mg total) by mouth daily.    Marland Kitchen azaTHIOprine (IMURAN) 50 MG tablet Take 150 mg by mouth daily.     . carvedilol (COREG) 3.125 MG tablet Take 1 tablet (3.125 mg total) by mouth 2 (two) times daily. 60 tablet 3  . diphenhydrAMINE (SOMINEX) 25 MG tablet Take 50 mg by mouth at bedtime as needed for allergies or sleep.    . furosemide (LASIX) 40 MG tablet Take 60 mg by mouth daily.    . furosemide (LASIX) 40 MG tablet TAKE ONE & ONE-HALF TABLETS BY MOUTH ONCE DAILY ( NEEDS APPOINTMENT ) (Patient not taking: Reported on 07/08/2014) 135 tablet 0  . HYDROcodone-acetaminophen (NORCO) 10-325 MG per tablet Take 1 tablet by mouth every 6 (six) hours as  needed for severe pain.    Marland Kitchen ipratropium (ATROVENT) 0.03 % nasal spray Place 2 sprays into both nostrils 3 (three) times daily as needed for rhinitis.    Marland Kitchen isosorbide-hydrALAZINE (BIDIL) 20-37.5 MG per tablet Take 1 tablet by mouth 2 (two) times daily.    . magnesium oxide (MAG-OX) 400 MG tablet Take 400 mg by mouth daily.     . Nutritional Supplements (GRAPESEED EXTRACT PO) Take 2 capsules by mouth daily.    Marland Kitchen omeprazole (PRILOSEC) 40 MG capsule Take 40 mg by mouth daily.    Marland Kitchen OVER THE COUNTER MEDICATION Take 1 tablet by mouth daily. Blue green algae    . OVER THE COUNTER MEDICATION Take 2 tablets by mouth daily. Digestive enzymes    . Polyvinyl  Alcohol-Povidone (REFRESH OP) Place 1 drop into both eyes daily as needed (red eyes).    . potassium chloride SA (K-DUR,KLOR-CON) 20 MEQ tablet Take 20 mEq by mouth daily at 12 noon.     . predniSONE (DELTASONE) 5 MG tablet Take 5 mg by mouth every other day.      . sacubitril-valsartan (ENTRESTO) 24-26 MG Take 1 tablet by mouth 2 (two) times daily. 60 tablet 11  . simvastatin (ZOCOR) 20 MG tablet TAKE ONE TABLET BY MOUTH ONCE DAILY AT BEDTIME ( NEEDS APPOINTMENT) (Patient not taking: Reported on 07/08/2014) 90 tablet 3  . simvastatin (ZOCOR) 20 MG tablet Take 20 mg by mouth daily.    . sotalol (BETAPACE) 120 MG tablet TAKE ONE TABLET BY MOUTH TWICE DAILY ( PATIENT NEEDS APPOINTMENT) (Patient not taking: Reported on 07/08/2014) 60 tablet 2  . sotalol (BETAPACE) 120 MG tablet Take 120 mg by mouth 2 (two) times daily.     No current facility-administered medications for this visit.    Allergies:   Procainamide and Quinidine   Social History: History   Social History  . Marital Status: Single    Spouse Name: N/A  . Number of Children: N/A  . Years of Education: N/A   Occupational History  . Not on file.   Social History Main Topics  . Smoking status: Former Smoker -- 0.30 packs/day for 6 years    Types: Cigarettes    Quit date: 01/03/1982  . Smokeless tobacco: Never Used  . Alcohol Use: Yes     Comment: occasional  . Drug Use: No  . Sexual Activity: Not on file   Other Topics Concern  . Not on file   Social History Narrative    Family History: Family History  Problem Relation Age of Onset  . Coronary artery disease Brother   . Diabetes Neg Hx   . Stroke Neg Hx   . Hypertension Brother     Review of Systems: All other systems reviewed and are otherwise negative except as noted above.   Physical Exam: VS:  BP 90/60 mmHg  Pulse 85  Ht 5' 8.5" (1.74 m)  Wt 185 lb 1.9 oz (83.97 kg)  BMI 27.73 kg/m2 , BMI Body mass index is 27.73 kg/(m^2).  GEN- The patient is well  appearing, alert and oriented x 3 today.   HEENT: normocephalic, atraumatic; sclera clear, conjunctiva pink; hearing intact; oropharynx clear; neck supple  Lungs- Clear to ausculation bilaterally, normal work of breathing.  No wheezes, rales, rhonchi Heart- Regular rate and rhythm (paced) GI- soft, non-tender, non-distended, bowel sounds present, no hepatosplenomegaly Extremities- no clubbing, cyanosis, or edema; DP/PT/radial pulses 2+ bilaterally MS- no significant deformity or atrophy Skin- warm and dry, no  rash or lesion; ICD pocket well healed Psych- euthymic mood, full affect Neuro- strength and sensation are intact  ICD interrogation- reviewed in detail today,  See PACEART report  EKG:  EKG is ordered today. The ekg ordered today shows sinus rhythm with V pacing, QTc stable  Recent Labs: 08/17/2013: Pro B Natriuretic peptide (BNP) 198.7* 01/21/2014: Magnesium 1.9 07/08/2014: BUN 15; Creatinine, Ser 0.95; Hemoglobin 13.1; Platelets 181; Potassium 3.9; Sodium 139   Wt Readings from Last 3 Encounters:  07/14/14 185 lb 1.9 oz (83.97 kg)  07/08/14 187 lb (84.823 kg)  02/17/14 187 lb (84.823 kg)     Other studies Reviewed: Additional studies/ records that were reviewed today include: Trinidad Curet notes, Dr Olin Pia notes, ER records   Assessment and Plan:  1.  Chronic systolic dysfunction euvolemic today Stable on an appropriate medical regimen Normal ICD function - LV lead was unable to be placed endocardially.  There was discussion about referral for epicardial LV lead placement, but with improvement in symptoms, this was deferred. See Pace Art report No changes today  2.  CAD/ICM No recent ischemic symptoms Continue medical therapy  3.  VT No recent device therapy Continue Sotalol Recent BMET normal  Current medicines are reviewed at length with the patient today.   The patient does not have concerns regarding his medicines.  The following changes were made today:   none  Labs/ tests ordered today include: none   Disposition:   Follow up with Dr [name] in 3 months   Signed, Chanetta Marshall, NP 07/14/2014 12:04 PM  [facility] [address] [place] [address] [phone] (office) [phone] (fax

## 2014-07-14 NOTE — Patient Instructions (Signed)
Medication Instructions:  Your physician recommends that you continue on your current medications as directed. Please refer to the Current Medication list given to you today.  Labwork: NONE  Testing/Procedures: NONE  Follow-Up: Your physician recommends that you schedule a follow-up appointment in: 3 months with Dr. Caryl Comes.  Any Other Special Instructions Will Be Listed Below (If Applicable).

## 2014-07-21 ENCOUNTER — Encounter: Payer: Medicare Other | Admitting: Nurse Practitioner

## 2014-09-11 ENCOUNTER — Encounter (HOSPITAL_COMMUNITY): Payer: Self-pay | Admitting: Emergency Medicine

## 2014-09-11 ENCOUNTER — Emergency Department (HOSPITAL_COMMUNITY)
Admission: EM | Admit: 2014-09-11 | Discharge: 2014-09-11 | Disposition: A | Discharge status: Home / Self Care | Payer: Medicare Other | Attending: Emergency Medicine | Admitting: Emergency Medicine

## 2014-09-11 DIAGNOSIS — R4182 Altered mental status, unspecified: Secondary | ICD-10-CM | POA: Diagnosis not present

## 2014-09-11 DIAGNOSIS — Z8669 Personal history of other diseases of the nervous system and sense organs: Secondary | ICD-10-CM | POA: Diagnosis not present

## 2014-09-11 DIAGNOSIS — I2510 Atherosclerotic heart disease of native coronary artery without angina pectoris: Secondary | ICD-10-CM | POA: Insufficient documentation

## 2014-09-11 DIAGNOSIS — Z8739 Personal history of other diseases of the musculoskeletal system and connective tissue: Secondary | ICD-10-CM | POA: Insufficient documentation

## 2014-09-11 DIAGNOSIS — Z7982 Long term (current) use of aspirin: Secondary | ICD-10-CM | POA: Diagnosis not present

## 2014-09-11 DIAGNOSIS — Z95810 Presence of automatic (implantable) cardiac defibrillator: Secondary | ICD-10-CM | POA: Insufficient documentation

## 2014-09-11 DIAGNOSIS — E785 Hyperlipidemia, unspecified: Secondary | ICD-10-CM | POA: Insufficient documentation

## 2014-09-11 DIAGNOSIS — Z9861 Coronary angioplasty status: Secondary | ICD-10-CM | POA: Diagnosis not present

## 2014-09-11 DIAGNOSIS — R0789 Other chest pain: Secondary | ICD-10-CM | POA: Diagnosis not present

## 2014-09-11 DIAGNOSIS — S39012A Strain of muscle, fascia and tendon of lower back, initial encounter: Secondary | ICD-10-CM | POA: Diagnosis not present

## 2014-09-11 DIAGNOSIS — Y998 Other external cause status: Secondary | ICD-10-CM | POA: Insufficient documentation

## 2014-09-11 DIAGNOSIS — G8929 Other chronic pain: Secondary | ICD-10-CM | POA: Diagnosis not present

## 2014-09-11 DIAGNOSIS — Y9389 Activity, other specified: Secondary | ICD-10-CM | POA: Insufficient documentation

## 2014-09-11 DIAGNOSIS — Z79899 Other long term (current) drug therapy: Secondary | ICD-10-CM | POA: Diagnosis not present

## 2014-09-11 DIAGNOSIS — Z87891 Personal history of nicotine dependence: Secondary | ICD-10-CM | POA: Insufficient documentation

## 2014-09-11 DIAGNOSIS — X58XXXA Exposure to other specified factors, initial encounter: Secondary | ICD-10-CM | POA: Diagnosis not present

## 2014-09-11 DIAGNOSIS — K219 Gastro-esophageal reflux disease without esophagitis: Secondary | ICD-10-CM | POA: Diagnosis not present

## 2014-09-11 DIAGNOSIS — Y9289 Other specified places as the place of occurrence of the external cause: Secondary | ICD-10-CM | POA: Diagnosis not present

## 2014-09-11 DIAGNOSIS — S3992XA Unspecified injury of lower back, initial encounter: Secondary | ICD-10-CM | POA: Diagnosis present

## 2014-09-11 LAB — I-STAT CHEM 8, ED
BUN: 21 mg/dL — ABNORMAL HIGH (ref 6–20)
Calcium, Ion: 1.17 mmol/L (ref 1.13–1.30)
Chloride: 105 mmol/L (ref 101–111)
Creatinine, Ser: 1 mg/dL (ref 0.61–1.24)
Glucose, Bld: 154 mg/dL — ABNORMAL HIGH (ref 65–99)
HEMATOCRIT: 44 % (ref 39.0–52.0)
Hemoglobin: 15 g/dL (ref 13.0–17.0)
POTASSIUM: 3.6 mmol/L (ref 3.5–5.1)
SODIUM: 141 mmol/L (ref 135–145)
TCO2: 25 mmol/L (ref 0–100)

## 2014-09-11 LAB — I-STAT TROPONIN, ED: Troponin i, poc: 0.01 ng/mL (ref 0.00–0.08)

## 2014-09-11 NOTE — ED Notes (Signed)
Per EMS, [employer] was alerted by a security alarm at an [employer]. EMS was requested to scene to remove taser probes. Patient was responsive only to pain at that time, patient has since become more alert and repeatedly requesting water. Patient intermittently admits to drug use, admits to etoh (will not quantify, "a little"). Patient with hx hypertension and diabetes. Patient with 18g R AC.

## 2014-09-11 NOTE — ED Provider Notes (Signed)
CSN: [id number]     Arrival date & time 09/11/14  0351 History   First MD Initiated Contact with Patient 09/11/14 0410     Chief Complaint  Patient presents with  . Altered Mental Status     (Consider location/radiation/quality/duration/timing/severity/associated sxs/prior Treatment) HPI Patient brought in by police after being apprehended during an attempted robbery. Please offers is report that the patient ran from officers. He was tased in the lower back. As he was being handcuffed patient became unresponsive. Continued to breathe. EMS removed Taser barbs and transported to the emergency department. Vital signs stable en route. Patient was uncooperative with nursing staff.  Patient is now awake and speaking. No known loss of consciousness. States he is having some low lumbar pain. This is acute as of tonight. Denies any focal weakness or numbness in his legs. He's had no loss of bladder or bowel function. He also is having left-sided chest pain. Patient states this is been constant for 2 weeks and unchanged. There is no radiation of the pain. No shortness of breath. Past Medical History  Diagnosis Date  . Chronic back pain     a. s/p multiple lumbar surgeries  . GERD (gastroesophageal reflux disease)   . Hyperlipidemia   . Myasthenia gravis   . Monomorphic ventricular tachycardia     a. s/p ICD 1997;   b. 06/2007 ICD upgrade to MDT Concerto Bi-V though LV lead unable to be placed.  . Complete heart block     a. h/o transient complete heart block in April 2009  . Sciatica   . DJD (degenerative joint disease)   . Ischemic cardiomyopathy     a. 05/2009 Echo: EF 40%, mild to mod glob HK, Gr 2 dd, mild LVH, Triv AI, Mild MR, PASP 37-31mmHg  . CAD (coronary artery disease)     a. s/p PCI/BMS of prox LAD in 1997;  b. 4/10 LHC: nonobs dzs. (pRCA 50-70%);  c.   Myoview 08/23/11 : Scar with peri-infarct ischemia infecting the entire septum with a corresponding wall motion abnormality, moderate  risk scan, EF 42%.   Past Surgical History  Procedure Laterality Date  . Cardiac defibrillator placement  2001; 2008    single chamber MDT ICD implanted for secondary prevention; gen change 2008; explantation 2009  . Coronary angioplasty with stent placement      bare metal stenting of the LAD in 1997  . Anterior lumbar interbody fusion  04/23/2007    L4-L5   . Gallbladder surgery    . Implantable cardioverter defibrillator implant  2009; 2015    MDT dual chamber right sided device implanted 2009 with gen change 2015; unable to place LV lead  . Bi-ventricular implantable cardioverter defibrillator upgrade N/A 04/10/2013    failed CRTD upgrade  . Bi-ventricular pacemaker upgrade N/A 10/14/2013    failed CRTD upgrade   Family History  Problem Relation Age of Onset  . Coronary artery disease Brother   . Diabetes Neg Hx   . Stroke Neg Hx   . Hypertension Brother   . Heart disease Mother   . Heart disease Father    Social History  Substance Use Topics  . Smoking status: Former Smoker -- 0.30 packs/day for 6 years    Types: Cigarettes    Quit date: 01/03/1982  . Smokeless tobacco: Never Used  . Alcohol Use: Yes     Comment: occasional    Review of Systems  Constitutional: Negative for fever and chills.  Respiratory: Negative for cough  and shortness of breath.   Cardiovascular: Positive for chest pain.  Gastrointestinal: Negative for nausea, vomiting and abdominal pain.  Genitourinary: Negative for difficulty urinating.  Musculoskeletal: Positive for myalgias and back pain. Negative for neck pain and neck stiffness.  Skin: Negative for rash and wound.  Neurological: Negative for dizziness, weakness, light-headedness, numbness and headaches.  Psychiatric/Behavioral: Positive for behavioral problems.  All other systems reviewed and are negative.     Allergies  Procainamide and Quinidine  Home Medications   Prior to Admission medications   Medication Sig Start Date End  Date Taking? Authorizing Provider  aspirin EC 81 MG tablet Take 1 tablet (81 mg total) by mouth daily. 01/25/11   Deboraha Sprang, MD  azaTHIOprine (IMURAN) 50 MG tablet Take 150 mg by mouth daily.     Historical Provider, MD  carvedilol (COREG) 3.125 MG tablet Take 1 tablet (3.125 mg total) by mouth 2 (two) times daily. 07/14/14   Amber Sena Slate, NP  diphenhydrAMINE (SOMINEX) 25 MG tablet Take 50 mg by mouth at bedtime as needed for allergies or sleep (Pt takes 50 mg once at bedtime as needed for sleep).     Historical Provider, MD  furosemide (LASIX) 40 MG tablet TAKE ONE & ONE-HALF TABLETS BY MOUTH ONCE DAILY 07/14/14   Patsey Berthold, NP  HYDROcodone-acetaminophen (NORCO) 10-325 MG per tablet Take 1 tablet by mouth every 6 (six) hours as needed for severe pain.    Historical Provider, MD  ipratropium (ATROVENT) 0.03 % nasal spray Place 2 sprays into both nostrils 3 (three) times daily as needed for rhinitis.    Historical Provider, MD  isosorbide-hydrALAZINE (BIDIL) 20-37.5 MG per tablet Take 1 tablet by mouth 2 (two) times daily. 07/14/14   Amber Sena Slate, NP  magnesium oxide (MAG-OX) 400 MG tablet Take 400 mg by mouth daily.     Historical Provider, MD  Nutritional Supplements (GRAPESEED EXTRACT PO) Take 2 capsules by mouth daily.    Historical Provider, MD  omeprazole (PRILOSEC) 40 MG capsule Take 40 mg by mouth daily.    Historical Provider, MD  OVER THE COUNTER MEDICATION Take 1 tablet by mouth daily. Blue green algae    Historical Provider, MD  OVER THE COUNTER MEDICATION Take 2 tablets by mouth daily. Digestive enzymes    Historical Provider, MD  Polyvinyl Alcohol-Povidone (REFRESH OP) Place 1 drop into both eyes daily as needed (red eyes).    Historical Provider, MD  potassium chloride SA (K-DUR,KLOR-CON) 20 MEQ tablet Take 1 tablet (20 mEq total) by mouth daily. 07/14/14   Amber Sena Slate, NP  predniSONE (DELTASONE) 5 MG tablet Take 5 mg by mouth every other day.      Historical Provider, MD   sacubitril-valsartan (ENTRESTO) 24-26 MG Take 1 tablet by mouth 2 (two) times daily. 07/14/14   Amber Sena Slate, NP  simvastatin (ZOCOR) 20 MG tablet Take 1 tablet (20 mg total) by mouth daily. 07/14/14   Amber Sena Slate, NP  sotalol (BETAPACE) 120 MG tablet Take 1 tablet (120 mg total) by mouth 2 (two) times daily. 07/14/14   Amber Sena Slate, NP   BP 131/77 mmHg  Pulse 92  Temp(Src) 97.7 F (36.5 C) (Oral)  Resp 18  SpO2 97% Physical Exam  Constitutional: He is oriented to person, place, and time. He appears well-developed and well-nourished. No distress.  HENT:  Head: Normocephalic and atraumatic.  Mouth/Throat: Oropharynx is clear and moist.  No obvious head or neck trauma. No intraoral trauma  to suggest seizure.  Eyes: EOM are normal. Pupils are equal, round, and reactive to light.  Pupils are 2 mm and reactive bilaterally.  Neck: Normal range of motion. Neck supple.  No posterior midline cervical tenderness to palpation. No meningismus.  Cardiovascular: Normal rate and regular rhythm.  Exam reveals no gallop and no friction rub.   No murmur heard. Pulmonary/Chest: Effort normal and breath sounds normal. No respiratory distress. He has no wheezes. He has no rales. He exhibits tenderness (chest pain is reproduced with palpation over the left sternal border. There is no crepitance or deformity.).  Abdominal: Soft. Bowel sounds are normal. He exhibits no distension and no mass. There is no tenderness. There is no rebound and no guarding.  Musculoskeletal: Normal range of motion. He exhibits tenderness. He exhibits no edema.  Patient does have some mild right-sided paraspinal lumbar tenderness. There is no midline thoracic or lumbar tenderness. Distal pulses are intact.  Neurological: He is alert and oriented to person, place, and time.  Patient moves all extremities without deficit. 5/5 motor. Sensation is fully intact. Patient's alert and oriented asking for water. Speaks in a clear voice.  Following commands.  Skin: Skin is warm and dry. No rash noted. No erythema.  Psychiatric: He has a normal mood and affect. His behavior is normal.  Nursing note and vitals reviewed.   ED Course  Procedures (including critical care time) Labs Review Labs Reviewed  I-STAT CHEM 8, ED - Abnormal; Notable for the following:    BUN 21 (*)    Glucose, Bld 154 (*)    All other components within normal limits  I-STAT TROPOININ, ED    Imaging Review No results found. I have personally reviewed and evaluated these images and lab results as part of my medical decision-making.   EKG Interpretation   Date/Time:  Thursday September 11 2014 04:30:38 EDT Ventricular Rate:  77 PR Interval:  177 QRS Duration: 101 QT Interval:  437 QTC Calculation: 495 R Axis:   -2 Text Interpretation:  Sinus rhythm Ventricular premature complex LAE,  consider biatrial enlargement Left ventricular hypertrophy Borderline  prolonged QT interval Confirmed by Lita Mains  MD, Kmarion Rawl ([id number]) on  09/11/2014 4:32:51 AM      MDM   Final diagnoses:  Atypical chest pain  Lumbar strain, initial encounter    Patient has had 2 weeks of constant left-sided chest pain reproduced with palpation. EKG without any ischemic findings. Troponin is negative. Patient is mentating normally with a normal neurologic exam. We'll discharge into police custody.    Julianne Rice, MD 09/11/14 [phone]

## 2014-09-11 NOTE — Discharge Instructions (Signed)
May take ibuprofen or Tylenol for back and chest pain. Follow-up with your primary physician.      Chest Pain (Nonspecific) It is often hard to give a specific diagnosis for the cause of chest pain. There is always a chance that your pain could be related to something serious, such as a heart attack or a blood clot in the lungs. You need to follow up with your health care provider for further evaluation. CAUSES   Heartburn.  Pneumonia or bronchitis.  Anxiety or stress.  Inflammation around your heart (pericarditis) or lung (pleuritis or pleurisy).  A blood clot in the lung.  A collapsed lung (pneumothorax). It can develop suddenly on its own (spontaneous pneumothorax) or from trauma to the chest.  Shingles infection (herpes zoster virus). The chest wall is composed of bones, muscles, and cartilage. Any of these can be the source of the pain.  The bones can be bruised by injury.  The muscles or cartilage can be strained by coughing or overwork.  The cartilage can be affected by inflammation and become sore (costochondritis). DIAGNOSIS  Lab tests or other studies may be needed to find the cause of your pain. Your health care provider may have you take a test called an ambulatory electrocardiogram (ECG). An ECG records your heartbeat patterns over a 24-hour period. You may also have other tests, such as:  Transthoracic echocardiogram (TTE). During echocardiography, sound waves are used to evaluate how blood flows through your heart.  Transesophageal echocardiogram (TEE).  Cardiac monitoring. This allows your health care provider to monitor your heart rate and rhythm in real time.  Holter monitor. This is a portable device that records your heartbeat and can help diagnose heart arrhythmias. It allows your health care provider to track your heart activity for several days, if needed.  Stress tests by exercise or by giving medicine that makes the heart beat faster. TREATMENT    Treatment depends on what may be causing your chest pain. Treatment may include:  Acid blockers for heartburn.  Anti-inflammatory medicine.  Pain medicine for inflammatory conditions.  Antibiotics if an infection is present.  You may be advised to change lifestyle habits. This includes stopping smoking and avoiding alcohol, caffeine, and chocolate.  You may be advised to keep your head raised (elevated) when sleeping. This reduces the chance of acid going backward from your stomach into your esophagus. Most of the time, nonspecific chest pain will improve within 2-3 days with rest and mild pain medicine.  HOME CARE INSTRUCTIONS   If antibiotics were prescribed, take them as directed. Finish them even if you start to feel better.  For the next few days, avoid physical activities that bring on chest pain. Continue physical activities as directed.  Do not use any tobacco products, including cigarettes, chewing tobacco, or electronic cigarettes.  Avoid drinking alcohol.  Only take medicine as directed by your health care provider.  Follow your health care provider's suggestions for further testing if your chest pain does not go away.  Keep any follow-up appointments you made. If you do not go to an appointment, you could develop lasting (chronic) problems with pain. If there is any problem keeping an appointment, call to reschedule. SEEK MEDICAL CARE IF:   Your chest pain does not go away, even after treatment.  You have a rash with blisters on your chest.  You have a fever. SEEK IMMEDIATE MEDICAL CARE IF:   You have increased chest pain or pain that spreads to your arm,  neck, jaw, back, or abdomen.  You have shortness of breath.  You have an increasing cough, or you cough up blood.  You have severe back or abdominal pain.  You feel nauseous or vomit.  You have severe weakness.  You faint.  You have chills. This is an emergency. Do not wait to see if the pain will  go away. Get medical help at once. Call your local emergency services (911 in [place] Do not drive yourself to the hospital. MAKE SURE YOU:   Understand these instructions.  Will watch your condition.  Will get help right away if you are not doing well or get worse. Document Released: 09/29/2004 Document Revised: 12/25/2012 Document Reviewed: 07/26/2007 [facility] Patient Information 2015 [place], [place]. This information is not intended to replace advice given to you by your health care provider. Make sure you discuss any questions you have with your health care provider.   Muscle Strain A muscle strain is an injury that occurs when a muscle is stretched beyond its normal length. Usually a small number of muscle fibers are torn when this happens. Muscle strain is rated in degrees. First-degree strains have the least amount of muscle fiber tearing and pain. Second-degree and third-degree strains have increasingly more tearing and pain.  Usually, recovery from muscle strain takes 1-2 weeks. Complete healing takes 5-6 weeks.  CAUSES  Muscle strain happens when a sudden, violent force placed on a muscle stretches it too far. This may occur with lifting, sports, or a fall.  RISK FACTORS Muscle strain is especially common in athletes.  SIGNS AND SYMPTOMS At the site of the muscle strain, there may be:  Pain.  Bruising.  Swelling.  Difficulty using the muscle due to pain or lack of normal function. DIAGNOSIS  Your health care provider will perform a physical exam and ask about your medical history. TREATMENT  Often, the best treatment for a muscle strain is resting, icing, and applying cold compresses to the injured area.  HOME CARE INSTRUCTIONS   Use the PRICE method of treatment to promote muscle healing during the first 2-3 days after your injury. The PRICE method involves:  Protecting the muscle from being injured again.  Restricting your activity and resting the injured body  part.  Icing your injury. To do this, put ice in a plastic bag. Place a towel between your skin and the bag. Then, apply the ice and leave it on from 15-20 minutes each hour. After the third day, switch to moist heat packs.  Apply compression to the injured area with a splint or elastic bandage. Be careful not to wrap it too tightly. This may interfere with blood circulation or increase swelling.  Elevate the injured body part above the level of your heart as often as you can.  Only take over-the-counter or prescription medicines for pain, discomfort, or fever as directed by your health care provider.  Warming up prior to exercise helps to prevent future muscle strains. SEEK MEDICAL CARE IF:   You have increasing pain or swelling in the injured area.  You have numbness, tingling, or a significant loss of strength in the injured area. MAKE SURE YOU:   Understand these instructions.  Will watch your condition.  Will get help right away if you are not doing well or get worse. Document Released: 12/20/2004 Document Revised: 10/10/2012 Document Reviewed: 07/19/2012 [facility] Patient Information 2015 [place], [place]. This information is not intended to replace advice given to you by your health care provider. Make  sure you discuss any questions you have with your health care provider. ° ° °

## 2014-09-11 NOTE — ED Notes (Addendum)
Patient resting quietly, eyes closed, NAD noted. Chest observed from rise and fall. [employer] remains at bedside.

## 2014-09-11 NOTE — ED Notes (Signed)
Bed: WA04 Expected date:  Expected time:  Means of arrival:  Comments: [employer] custody/unresponsive

## 2014-09-11 NOTE — ED Notes (Signed)
Handcuffs removed by police at this time.

## 2014-09-11 NOTE — ED Notes (Signed)
Patient uncooperative, unwilling to answer screening or assessment questions. Patient repeats "water, water, water". Patient advised he may not have water until cleared by MD. [employer] remains at bedside.

## 2014-09-11 NOTE — ED Notes (Signed)
Patient given water, ok by MD

## 2014-09-11 NOTE — ED Notes (Signed)
Patient questioned if he had done drugs tonight, patient denies. Patient was asked if he had been drinking alcohol, patient denies. Patient was asked about his health history to which he replies "I have everything", patient unable to expand at this time.

## 2014-10-03 ENCOUNTER — Encounter: Payer: Medicare Other | Admitting: Internal Medicine

## 2014-10-10 ENCOUNTER — Other Ambulatory Visit: Payer: Self-pay | Admitting: Internal Medicine

## 2014-10-13 ENCOUNTER — Ambulatory Visit (INDEPENDENT_AMBULATORY_CARE_PROVIDER_SITE_OTHER): Payer: Medicare Other | Admitting: *Deleted

## 2014-10-13 ENCOUNTER — Encounter: Payer: Self-pay | Admitting: Internal Medicine

## 2014-10-13 DIAGNOSIS — Z95810 Presence of automatic (implantable) cardiac defibrillator: Secondary | ICD-10-CM

## 2014-10-13 DIAGNOSIS — I442 Atrioventricular block, complete: Secondary | ICD-10-CM | POA: Diagnosis not present

## 2014-10-13 LAB — CUP PACEART INCLINIC DEVICE CHECK
Battery Remaining Longevity: 80 mo
Battery Voltage: 2.97 V
Brady Statistic AP VP Percent: 22.72 %
Brady Statistic AP VS Percent: 0 %
Brady Statistic AS VP Percent: 77.2 %
Brady Statistic AS VS Percent: 0.08 %
Brady Statistic RV Percent Paced: 99.85 %
Date Time Interrogation Session: 20161010160511
HIGH POWER IMPEDANCE MEASURED VALUE: 48 Ohm
HighPow Impedance: 190 Ohm
HighPow Impedance: 39 Ohm
Lead Channel Pacing Threshold Amplitude: 0.75 V
Lead Channel Pacing Threshold Pulse Width: 0.4 ms
Lead Channel Sensing Intrinsic Amplitude: 2 mV
Lead Channel Setting Pacing Amplitude: 2.5 V
Lead Channel Setting Pacing Pulse Width: 0.4 ms
MDC IDC MSMT LEADCHNL RA IMPEDANCE VALUE: 437 Ohm
MDC IDC MSMT LEADCHNL RA PACING THRESHOLD AMPLITUDE: 1.75 V
MDC IDC MSMT LEADCHNL RA PACING THRESHOLD PULSEWIDTH: 0.4 ms
MDC IDC MSMT LEADCHNL RV IMPEDANCE VALUE: 380 Ohm
MDC IDC SET LEADCHNL RA PACING AMPLITUDE: 3.5 V
MDC IDC SET LEADCHNL RV SENSING SENSITIVITY: 0.3 mV
MDC IDC SET ZONE DETECTION INTERVAL: 250 ms
MDC IDC SET ZONE DETECTION INTERVAL: 350 ms
MDC IDC SET ZONE DETECTION INTERVAL: 360 ms
MDC IDC STAT BRADY RA PERCENT PACED: 22.72 %
Zone Setting Detection Interval: 300 ms
Zone Setting Detection Interval: 380 ms

## 2014-10-13 NOTE — Progress Notes (Signed)
ICD check in clinic. Normal device function. Thresholds and sensing consistent with previous device measurements. Impedance trends stable over time. No evidence of any ventricular arrhythmias. No mode switches. Histogram distribution appropriate for patient and level of activity. No changes made this session. Device programmed at appropriate safety margins. Device programmed to optimize intrinsic conduction. Estimated longevity 6.6 years. Pt enrolled in remote follow-up. ROV with SK 01/06/15.

## 2015-01-06 ENCOUNTER — Ambulatory Visit (INDEPENDENT_AMBULATORY_CARE_PROVIDER_SITE_OTHER): Payer: Medicare Other | Admitting: Internal Medicine

## 2015-01-06 ENCOUNTER — Encounter: Payer: Self-pay | Admitting: Internal Medicine

## 2015-01-06 VITALS — BP 114/70 | HR 76 | Ht 68.0 in | Wt 188.0 lb

## 2015-01-06 DIAGNOSIS — I442 Atrioventricular block, complete: Secondary | ICD-10-CM | POA: Diagnosis not present

## 2015-01-06 DIAGNOSIS — I5022 Chronic systolic (congestive) heart failure: Secondary | ICD-10-CM | POA: Diagnosis not present

## 2015-01-06 DIAGNOSIS — R0602 Shortness of breath: Secondary | ICD-10-CM | POA: Diagnosis not present

## 2015-01-06 LAB — CUP PACEART INCLINIC DEVICE CHECK
Battery Remaining Longevity: 69 mo
Brady Statistic AP VS Percent: 0 %
Brady Statistic AS VS Percent: 0.01 %
Brady Statistic RV Percent Paced: 99.99 %
HighPow Impedance: 41 Ohm
HighPow Impedance: 44 Ohm
Implantable Lead Implant Date: 20090619
Implantable Lead Implant Date: 20090619
Implantable Lead Location: 753859
Implantable Lead Location: 753860
Lead Channel Impedance Value: 342 Ohm
Lead Channel Impedance Value: 4047 Ohm
Lead Channel Impedance Value: 4047 Ohm
Lead Channel Impedance Value: 4047 Ohm
Lead Channel Impedance Value: 437 Ohm
Lead Channel Pacing Threshold Pulse Width: 0.4 ms
Lead Channel Sensing Intrinsic Amplitude: 2.125 mV
Lead Channel Sensing Intrinsic Amplitude: 9.125 mV
Lead Channel Setting Pacing Amplitude: 2.5 V
Lead Channel Setting Pacing Pulse Width: 0.4 ms
MDC IDC LEAD MODEL: 7120
MDC IDC MSMT BATTERY VOLTAGE: 2.97 V
MDC IDC MSMT LEADCHNL LV IMPEDANCE VALUE: 4047 Ohm
MDC IDC MSMT LEADCHNL LV IMPEDANCE VALUE: 4047 Ohm
MDC IDC MSMT LEADCHNL LV IMPEDANCE VALUE: 4047 Ohm
MDC IDC MSMT LEADCHNL LV IMPEDANCE VALUE: 4047 Ohm
MDC IDC MSMT LEADCHNL LV IMPEDANCE VALUE: 4047 Ohm
MDC IDC MSMT LEADCHNL LV IMPEDANCE VALUE: 4047 Ohm
MDC IDC MSMT LEADCHNL LV IMPEDANCE VALUE: 4047 Ohm
MDC IDC MSMT LEADCHNL RA PACING THRESHOLD AMPLITUDE: 1.75 V
MDC IDC MSMT LEADCHNL RV IMPEDANCE VALUE: 247 Ohm
MDC IDC MSMT LEADCHNL RV PACING THRESHOLD AMPLITUDE: 1 V
MDC IDC MSMT LEADCHNL RV PACING THRESHOLD PULSEWIDTH: 0.4 ms
MDC IDC SESS DTM: 20170103125943
MDC IDC SET LEADCHNL RA PACING AMPLITUDE: 3 V
MDC IDC SET LEADCHNL RV SENSING SENSITIVITY: 0.3 mV
MDC IDC STAT BRADY AP VP PERCENT: 17.7 %
MDC IDC STAT BRADY AS VP PERCENT: 82.3 %
MDC IDC STAT BRADY RA PERCENT PACED: 17.7 %

## 2015-01-06 NOTE — Progress Notes (Signed)
seborrheic keratosis include Coumadin) forf Patient Care Team: Wallene Huh, MD as PCP - General (Cardiology)   HPI  [name] is a 65 y.o. male is seen in followup for ventricular tachycardia in the setting of ischemic and nonischemic heart disease. He is status post ICD implantation.  2010 because of symptoms of progressive shortness of breath, he underwent evaluation including an echo demonstrating an ejection fraction of 40% or so and his Myoview scan demonstrated no ischemia. Repeat myoview scan 8/13 demonstrated infarct with peri-infarct ischemia.catheterization February 2014 demonstrated no obstructive coronary disease.  Echocardiogram 9/15 demonstrated EF of 30-35%  There was worsening symptoms in October we undertook a reattempt at LV lead placement from the left side. During that there is left subclavian venous stenosis which were finally able to navigate. However, this wasn't sufficiently done so that when we got access to the coronary sinus was unable to deliver the sheath. We ultimately were unable to place an LV lead.   he continues to struggle with shortness of breath which in his mind is worsening. He has not had peripheral edema. Cardiopulmonarystress testing in the past demonstrated>>3/14 >>>Exercise testing with gas exchange demonstrates a moderate functional limitation due to a circulatory impairment. There is also evidence of chronotropic incompetence  This has not been associated with edema or nocturnal dyspnea.  Past Medical History  Diagnosis Date  . Chronic back pain     a. s/p multiple lumbar surgeries  . GERD (gastroesophageal reflux disease)   . Hyperlipidemia   . Myasthenia gravis   . Monomorphic ventricular tachycardia ([place])     a. s/p ICD 1997;   b. 06/2007 ICD upgrade to MDT Concerto Bi-V though LV lead unable to be placed.  . Complete heart block ([place])     a. h/o transient complete heart block in April 2009  . Sciatica   . DJD (degenerative  joint disease)   . Ischemic cardiomyopathy     a. 05/2009 Echo: EF 40%, mild to mod glob HK, Gr 2 dd, mild LVH, Triv AI, Mild MR, PASP 37-55mmHg  . CAD (coronary artery disease)     a. s/p PCI/BMS of prox LAD in 1997;  b. 4/10 LHC: nonobs dzs. (pRCA 50-70%);  c.   Myoview 08/23/11 : Scar with peri-infarct ischemia infecting the entire septum with a corresponding wall motion abnormality, moderate risk scan, EF 42%.    Past Surgical History  Procedure Laterality Date  . Cardiac defibrillator placement  2001; 2008    single chamber MDT ICD implanted for secondary prevention; gen change 2008; explantation 2009  . Coronary angioplasty with stent placement      bare metal stenting of the LAD in 1997  . Anterior lumbar interbody fusion  04/23/2007    L4-L5   . Gallbladder surgery    . Implantable cardioverter defibrillator implant  2009; 2015    MDT dual chamber right sided device implanted 2009 with gen change 2015; unable to place LV lead  . Bi-ventricular implantable cardioverter defibrillator upgrade N/A 04/10/2013    failed CRTD upgrade  . Bi-ventricular pacemaker upgrade N/A 10/14/2013    failed CRTD upgrade    Current Outpatient Prescriptions  Medication Sig Dispense Refill  . aspirin EC 81 MG tablet Take 1 tablet (81 mg total) by mouth daily.    Marland Kitchen azaTHIOprine (IMURAN) 50 MG tablet Take 150 mg by mouth daily.     . carvedilol (COREG) 3.125 MG tablet Take 1 tablet (3.125 mg total) by mouth 2 (two)  times daily. 60 tablet 11  . diphenhydrAMINE (SOMINEX) 25 MG tablet Take 50 mg by mouth at bedtime as needed for allergies or sleep (Pt takes 50 mg once at bedtime as needed for sleep).     . furosemide (LASIX) 40 MG tablet TAKE ONE & ONE-HALF TABLETS BY MOUTH ONCE DAILY 45 tablet 11  . HYDROcodone-acetaminophen (NORCO) 10-325 MG per tablet Take 1 tablet by mouth every 6 (six) hours as needed for severe pain.    Marland Kitchen ipratropium (ATROVENT) 0.03 % nasal spray Place 2 sprays into both nostrils 3  (three) times daily as needed for rhinitis.    Marland Kitchen isosorbide-hydrALAZINE (BIDIL) 20-37.5 MG per tablet Take 1 tablet by mouth 2 (two) times daily. 60 tablet 11  . magnesium oxide (MAG-OX) 400 MG tablet Take 400 mg by mouth daily.     . Nutritional Supplements (GRAPESEED EXTRACT PO) Take 2 capsules by mouth daily.    Marland Kitchen omeprazole (PRILOSEC) 40 MG capsule Take 40 mg by mouth daily.    Marland Kitchen OVER THE COUNTER MEDICATION Take 1 tablet by mouth daily. Blue green algae    . OVER THE COUNTER MEDICATION Take 2 tablets by mouth daily. Digestive enzymes    . Polyvinyl Alcohol-Povidone (REFRESH OP) Place 1 drop into both eyes daily as needed (red eyes).    . potassium chloride SA (K-DUR,KLOR-CON) 20 MEQ tablet Take 1 tablet (20 mEq total) by mouth daily. 30 tablet 11  . predniSONE (DELTASONE) 5 MG tablet Take 5 mg by mouth every other day.      . sacubitril-valsartan (ENTRESTO) 24-26 MG Take 1 tablet by mouth 2 (two) times daily.    . simvastatin (ZOCOR) 20 MG tablet Take 1 tablet (20 mg total) by mouth daily. 30 tablet 11  . sotalol (BETAPACE) 120 MG tablet Take 1 tablet (120 mg total) by mouth 2 (two) times daily. 60 tablet 11   No current facility-administered medications for this visit.    Allergies  Allergen Reactions  . Procainamide Other (See Comments)    Allergy is from [facility] records - pt is not aware of any reaction to this medicine  . Quinidine Other (See Comments)    Allergy is from [facility] records - pt is not aware of any reaction to this medicine    Review of Systems negative except from HPI and PMH  Physical Exam BP 114/70 mmHg  Pulse 76  Ht 5\' 8"  (1.727 m)  Wt 188 lb (85.276 kg)  BMI 28.59 kg/m2 Well developed and nourished in no acute distress HENT normal Neck supple with JVP-flat Clear Regular rate and rhythm, no murmurs or gallops Abd-soft with active BS No Clubbing cyanosis edema Skin-warm and dry A & Oriented  Grossly normal sensory and motor function  Skin  Warm and Dry  ECG demonstrates sinus rhythm with synchronous RV apical pacing and left bundle branch block. Intervals 14/21/48  Assessment and  Plan   Ischemic and nonischemic cardiomyopathy  Ventricular tachycardia  Complete heart block with RV apical pacing  Chronic systolic heart failure  Implantable defibrillator [employer] The patient's device was interrogated.  The information was reviewed. No changes were made in the programming.     Hypertension  His functional status continues to deteriorate he now has class III symptoms. He is euvolemic. We will keep him on his current medications and will keep his Entresto at its current dose. He remains on sotalol safety parameters of which are reasonable.  There has been no intercurrent ventricular tachycardia.  We will look a cardiopulmonary stress testing and a repeat echo in anticipation of referral to Dr. Cyndia Bent for consideration of LV epicardial lead placement given his to endovascular failures and his and his very broad left bundle branch block.

## 2015-01-06 NOTE — Patient Instructions (Signed)
Medication Instructions: - no changes  Labwork: - none  Procedures/Testing: - Your physician has recommended that you have a cardiopulmonary stress test (CPX). CPX testing is a non-invasive measurement of heart and lung function. It replaces a traditional treadmill stress test. This type of test provides a tremendous amount of information that relates not only to your present condition but also for future outcomes. This test combines measurements of you ventilation, respiratory gas exchange in the lungs, electrocardiogram (EKG), blood pressure and physical response before, during, and following an exercise protocol.  - Your physician has requested that you have an echocardiogram. Echocardiography is a painless test that uses sound waves to create images of your heart. It provides your doctor with information about the size and shape of your heart and how well your heart's chambers and valves are working. This procedure takes approximately one hour. There are no restrictions for this procedure.  Follow-Up: - Remote monitoring is used to monitor your Pacemaker of ICD from home. This monitoring reduces the number of office visits required to check your device to one time per year. It allows [place] to keep an eye on the functioning of your device to ensure it is working properly. You are scheduled for a device check from home on 04/07/15. You may send your transmission at any time that day. If you have a wireless device, the transmission will be sent automatically. After your physician reviews your transmission, you will receive a postcard with your next transmission date.  - Your physician wants you to follow-up in: 1 year with Dr. Caryl Comes. You will receive a reminder letter in the mail two months in advance. If you don't receive a letter, please call our office to schedule the follow-up appointment.  Any Additional Special Instructions Will Be Listed Below (If Applicable).

## 2015-01-14 ENCOUNTER — Ambulatory Visit (HOSPITAL_COMMUNITY): Payer: Medicare Other | Attending: Cardiovascular Disease

## 2015-01-14 ENCOUNTER — Other Ambulatory Visit: Payer: Self-pay

## 2015-01-14 DIAGNOSIS — R0602 Shortness of breath: Secondary | ICD-10-CM

## 2015-01-14 DIAGNOSIS — I5022 Chronic systolic (congestive) heart failure: Secondary | ICD-10-CM

## 2015-01-14 DIAGNOSIS — R0600 Dyspnea, unspecified: Secondary | ICD-10-CM | POA: Diagnosis not present

## 2015-01-14 DIAGNOSIS — Z8249 Family history of ischemic heart disease and other diseases of the circulatory system: Secondary | ICD-10-CM | POA: Insufficient documentation

## 2015-01-14 DIAGNOSIS — E785 Hyperlipidemia, unspecified: Secondary | ICD-10-CM | POA: Diagnosis not present

## 2015-01-14 DIAGNOSIS — I442 Atrioventricular block, complete: Secondary | ICD-10-CM | POA: Diagnosis not present

## 2015-01-21 ENCOUNTER — Encounter (HOSPITAL_COMMUNITY): Payer: Medicare Other

## 2015-04-07 ENCOUNTER — Ambulatory Visit (INDEPENDENT_AMBULATORY_CARE_PROVIDER_SITE_OTHER): Payer: Medicare Other | Admitting: *Deleted

## 2015-04-07 DIAGNOSIS — I472 Ventricular tachycardia, unspecified: Secondary | ICD-10-CM

## 2015-04-07 DIAGNOSIS — I442 Atrioventricular block, complete: Secondary | ICD-10-CM | POA: Diagnosis not present

## 2015-04-07 DIAGNOSIS — Z95810 Presence of automatic (implantable) cardiac defibrillator: Secondary | ICD-10-CM

## 2015-04-07 NOTE — Progress Notes (Signed)
Remote ICD transmission.   

## 2015-04-28 ENCOUNTER — Telehealth: Payer: Self-pay | Admitting: Internal Medicine

## 2015-04-28 NOTE — Telephone Encounter (Signed)
New message      Calling to get the status of his condition----what is next?

## 2015-04-28 NOTE — Telephone Encounter (Signed)
Will forward to Dr. Caryl Comes to review. Patient had an echo repeated 01/14/15 and his EF was 40-45%. CPX test was cancelled.

## 2015-05-08 LAB — CUP PACEART REMOTE DEVICE CHECK
Battery Voltage: 2.95 V
Brady Statistic AP VP Percent: 23.05 %
Brady Statistic AP VS Percent: 0 %
Brady Statistic AS VP Percent: 76.94 %
Brady Statistic AS VS Percent: 0.01 %
Brady Statistic RV Percent Paced: 99.98 %
Date Time Interrogation Session: 20170404082504
HighPow Impedance: 40 Ohm
HighPow Impedance: 46 Ohm
Implantable Lead Implant Date: 20090619
Implantable Lead Location: 753859
Implantable Lead Model: 5076
Implantable Lead Model: 7120
Lead Channel Impedance Value: 266 Ohm
Lead Channel Impedance Value: 380 Ohm
Lead Channel Impedance Value: 4047 Ohm
Lead Channel Impedance Value: 4047 Ohm
Lead Channel Impedance Value: 4047 Ohm
Lead Channel Impedance Value: 4047 Ohm
Lead Channel Impedance Value: 4047 Ohm
Lead Channel Impedance Value: 4047 Ohm
Lead Channel Impedance Value: 4047 Ohm
Lead Channel Pacing Threshold Amplitude: 2.5 V
Lead Channel Pacing Threshold Pulse Width: 0.4 ms
Lead Channel Sensing Intrinsic Amplitude: 2.125 mV
Lead Channel Sensing Intrinsic Amplitude: 9.125 mV
Lead Channel Setting Pacing Amplitude: 2.5 V
Lead Channel Setting Pacing Amplitude: 5 V
Lead Channel Setting Sensing Sensitivity: 0.3 mV
MDC IDC LEAD IMPLANT DT: 20090619
MDC IDC LEAD LOCATION: 753860
MDC IDC MSMT BATTERY REMAINING LONGEVITY: 44 mo
MDC IDC MSMT LEADCHNL LV IMPEDANCE VALUE: 4047 Ohm
MDC IDC MSMT LEADCHNL LV IMPEDANCE VALUE: 4047 Ohm
MDC IDC MSMT LEADCHNL LV IMPEDANCE VALUE: 4047 Ohm
MDC IDC MSMT LEADCHNL RA PACING THRESHOLD PULSEWIDTH: 0.4 ms
MDC IDC MSMT LEADCHNL RA SENSING INTR AMPL: 2.125 mV
MDC IDC MSMT LEADCHNL RV IMPEDANCE VALUE: 342 Ohm
MDC IDC MSMT LEADCHNL RV PACING THRESHOLD AMPLITUDE: 0.75 V
MDC IDC SET LEADCHNL RV PACING PULSEWIDTH: 0.4 ms
MDC IDC STAT BRADY RA PERCENT PACED: 23.05 %

## 2015-05-12 ENCOUNTER — Telehealth: Payer: Self-pay

## 2015-05-12 NOTE — Telephone Encounter (Signed)
See 4/25 phone note.

## 2015-05-12 NOTE — Telephone Encounter (Signed)
Michaelyn Barter, RN at 05/12/2015 1:36 PM     Status: Signed       Expand All Collapse All   Patient came in to the office today wanting some questions answered. Patient stated he wants to know what his status is, what does he need to do next, and is there going to be an implant of another wire? Patient stated he was at [facility]. [place] neurology ordered him a chest x-ray today, due to some pinching in his chest when he puts his right hand over his head. Patient wanted Dr. Caryl Comes to be aware of all that is going on and request some answers to his questions. Will forward to Dr. Caryl Comes.

## 2015-05-12 NOTE — Telephone Encounter (Signed)
Per Dr. Caryl Comes, last echo done in January 2017 showed the patient's EF to be 40-45%. With improved EF, he is not a candidate for CRT upgrade. I left a message for the patient to call.

## 2015-05-12 NOTE — Telephone Encounter (Signed)
Patient came in to the office today wanting some questions answered. Patient stated he wants to know what his status is, what does he need to do next, and is there going to be an implant of another wire? Patient stated he was at [facility]. [place] neurology ordered him a chest x-ray today, due to some pinching in his chest when he puts his right hand over his head. Patient wanted Dr. Caryl Comes to be aware of all that is going on and request some answers to his questions. Will forward to Dr. Caryl Comes.

## 2015-05-13 ENCOUNTER — Encounter: Payer: Self-pay | Admitting: Cardiology

## 2015-05-14 NOTE — Telephone Encounter (Signed)
Discussed Dr Olin Pia comments and recommendations with pt.    Pt verbalized understanding that with improved EF from echo Jan 2017 he did not need CPX and was not a candidate for ICD upgrade.

## 2015-05-14 NOTE — Telephone Encounter (Signed)
New Message  Pt called Request a call back to follow up.

## 2015-05-15 ENCOUNTER — Telehealth: Payer: Self-pay | Admitting: Cardiology

## 2015-05-15 NOTE — Telephone Encounter (Signed)
Lela w/ [employer] called and stated that pt wanted to know why he didn't have a third lead. I informed her that according to phone note from 04-28-2015 pt is not eligable for a CRT upgrade b/c his EF is 40-45 %.

## 2015-06-19 ENCOUNTER — Other Ambulatory Visit: Payer: Self-pay | Admitting: Physician Assistant

## 2015-06-26 ENCOUNTER — Telehealth: Payer: Self-pay | Admitting: *Deleted

## 2015-06-26 NOTE — Telephone Encounter (Signed)
LMTCB/sss  Patient needs appt with Device clinic.

## 2015-06-26 NOTE — Telephone Encounter (Signed)
-----   Message from Deboraha Sprang, MD sent at 05/19/2015 12:27 PM EDT ----- Remote reviewed. This remote is abnormal for Atrial output HIGH Can you bring him and turn off autocapture

## 2015-07-02 ENCOUNTER — Ambulatory Visit (INDEPENDENT_AMBULATORY_CARE_PROVIDER_SITE_OTHER): Payer: Medicare Other | Admitting: *Deleted

## 2015-07-02 DIAGNOSIS — I472 Ventricular tachycardia, unspecified: Secondary | ICD-10-CM

## 2015-07-02 DIAGNOSIS — I442 Atrioventricular block, complete: Secondary | ICD-10-CM | POA: Diagnosis not present

## 2015-07-02 DIAGNOSIS — I255 Ischemic cardiomyopathy: Secondary | ICD-10-CM | POA: Diagnosis not present

## 2015-07-02 LAB — CUP PACEART INCLINIC DEVICE CHECK
Battery Voltage: 2.93 V
Brady Statistic AP VP Percent: 32.24 %
Brady Statistic RA Percent Paced: 32.24 %
Date Time Interrogation Session: 20170629103912
HIGH POWER IMPEDANCE MEASURED VALUE: 40 Ohm
HIGH POWER IMPEDANCE MEASURED VALUE: 47 Ohm
Implantable Lead Implant Date: 20090619
Implantable Lead Location: 753859
Implantable Lead Model: 5076
Implantable Lead Model: 7120
Lead Channel Impedance Value: 380 Ohm
Lead Channel Impedance Value: 4047 Ohm
Lead Channel Impedance Value: 4047 Ohm
Lead Channel Impedance Value: 4047 Ohm
Lead Channel Impedance Value: 4047 Ohm
Lead Channel Pacing Threshold Pulse Width: 0.4 ms
Lead Channel Sensing Intrinsic Amplitude: 1.75 mV
Lead Channel Sensing Intrinsic Amplitude: 9.125 mV
Lead Channel Setting Pacing Amplitude: 2.5 V
Lead Channel Setting Pacing Amplitude: 3 V
Lead Channel Setting Sensing Sensitivity: 0.3 mV
MDC IDC LEAD IMPLANT DT: 20090619
MDC IDC LEAD LOCATION: 753860
MDC IDC MSMT BATTERY REMAINING LONGEVITY: 40 mo
MDC IDC MSMT LEADCHNL LV IMPEDANCE VALUE: 4047 Ohm
MDC IDC MSMT LEADCHNL LV IMPEDANCE VALUE: 4047 Ohm
MDC IDC MSMT LEADCHNL LV IMPEDANCE VALUE: 4047 Ohm
MDC IDC MSMT LEADCHNL LV IMPEDANCE VALUE: 4047 Ohm
MDC IDC MSMT LEADCHNL LV IMPEDANCE VALUE: 4047 Ohm
MDC IDC MSMT LEADCHNL LV IMPEDANCE VALUE: 4047 Ohm
MDC IDC MSMT LEADCHNL RA IMPEDANCE VALUE: 399 Ohm
MDC IDC MSMT LEADCHNL RA PACING THRESHOLD AMPLITUDE: 1.5 V
MDC IDC MSMT LEADCHNL RA PACING THRESHOLD AMPLITUDE: 2.5 V
MDC IDC MSMT LEADCHNL RA PACING THRESHOLD PULSEWIDTH: 1 ms
MDC IDC MSMT LEADCHNL RA SENSING INTR AMPL: 1.375 mV
MDC IDC MSMT LEADCHNL RV IMPEDANCE VALUE: 247 Ohm
MDC IDC MSMT LEADCHNL RV PACING THRESHOLD AMPLITUDE: 0.625 V
MDC IDC MSMT LEADCHNL RV PACING THRESHOLD PULSEWIDTH: 0.4 ms
MDC IDC SET LEADCHNL RV PACING PULSEWIDTH: 0.4 ms
MDC IDC STAT BRADY AP VS PERCENT: 0 %
MDC IDC STAT BRADY AS VP PERCENT: 67.74 %
MDC IDC STAT BRADY AS VS PERCENT: 0.02 %
MDC IDC STAT BRADY RV PERCENT PACED: 99.96 %

## 2015-07-02 NOTE — Progress Notes (Signed)
ICD check in clinic. Normal device function. RV threshold and sensing consistent with previous device measurements. Impedance trends stable over time. Increase in RA threshold noted--now 2.25V @ 0.28ms, 1.5V @ 1.7ms---RA output fixed at 3.0V @1 .74ms, capture management D/C'd. No evidence of any ventricular arrhythmias. No mode switches. Histogram distribution appropriate for patient and level of activity. Stable thoracic impedance. Device programmed at appropriate safety margins. Device programmed to optimize intrinsic conduction. Estimated longevity 2.7 years. Pt will follow up via [facility] on 9/28 and with SK in 04-2016. Patient education completed including shock plan. Alert tones demonstrated for patient.

## 2015-07-03 NOTE — Telephone Encounter (Signed)
Patient seen on 07/02/15.

## 2015-07-26 ENCOUNTER — Other Ambulatory Visit: Payer: Self-pay | Admitting: Nurse Practitioner

## 2015-07-30 ENCOUNTER — Other Ambulatory Visit: Payer: Self-pay | Admitting: Nurse Practitioner

## 2015-08-03 ENCOUNTER — Other Ambulatory Visit: Payer: Self-pay | Admitting: Nurse Practitioner

## 2015-08-04 ENCOUNTER — Other Ambulatory Visit: Payer: Self-pay

## 2015-08-04 NOTE — Telephone Encounter (Signed)
[name] refilled this today.

## 2015-08-11 ENCOUNTER — Other Ambulatory Visit: Payer: Self-pay | Admitting: Nurse Practitioner

## 2015-08-12 NOTE — Telephone Encounter (Signed)
Please advise on refill request as there is not a recent lipid panel in epic.

## 2015-08-13 ENCOUNTER — Other Ambulatory Visit: Payer: Self-pay | Admitting: *Deleted

## 2015-08-13 DIAGNOSIS — E785 Hyperlipidemia, unspecified: Secondary | ICD-10-CM

## 2015-08-13 NOTE — Telephone Encounter (Signed)
OK for Bi-dil. Please check with the patient to see if his PCP has checked his lipids in the last 6-12 months. If so, please forward statin RX to PCP to refill.  Thank you!

## 2015-08-13 NOTE — Telephone Encounter (Signed)
Ok thanks 

## 2015-08-13 NOTE — Telephone Encounter (Signed)
Spoke with patient and he stated that his pcp has not checked his lipids, in fact he stated that he has not seen his pcp in a while. He stated that he is willing to come to the office to have his blood drawn.

## 2015-08-13 NOTE — Telephone Encounter (Signed)
Ok, that would be the right thing to do then. If you can find out for him what day he can come for FASTING labs, I will put the order in and schedule.  Thanks!

## 2015-08-13 NOTE — Telephone Encounter (Signed)
Patient stated that he will come for labs tomorrow.

## 2015-08-14 ENCOUNTER — Other Ambulatory Visit: Payer: Medicare Other | Admitting: *Deleted

## 2015-08-14 DIAGNOSIS — E785 Hyperlipidemia, unspecified: Secondary | ICD-10-CM

## 2015-08-14 LAB — LIPID PANEL
CHOL/HDL RATIO: 2.6 ratio (ref <=5.0)
Cholesterol: 165 mg/dL (ref 125–200)
HDL: 63 mg/dL (ref >=40)
LDL CALC: 84 mg/dL (ref <130)
TRIGLYCERIDES: 88 mg/dL (ref <150)
VLDL: 18 mg/dL (ref <30)

## 2015-08-18 ENCOUNTER — Telehealth: Payer: Self-pay

## 2015-08-18 NOTE — Telephone Encounter (Signed)
pt did not answer. lmtcb

## 2015-09-08 ENCOUNTER — Other Ambulatory Visit: Payer: Self-pay | Admitting: *Deleted

## 2015-09-08 MED ORDER — CARVEDILOL 3.125 MG PO TABS: 3.1250 mg | ORAL_TABLET | Freq: Two times a day (BID) | ORAL | 1 refills | Status: DC

## 2015-10-01 ENCOUNTER — Ambulatory Visit (INDEPENDENT_AMBULATORY_CARE_PROVIDER_SITE_OTHER): Payer: Medicare Other | Admitting: *Deleted

## 2015-10-01 ENCOUNTER — Telehealth: Payer: Self-pay | Admitting: Cardiology

## 2015-10-01 DIAGNOSIS — I255 Ischemic cardiomyopathy: Secondary | ICD-10-CM | POA: Diagnosis not present

## 2015-10-01 DIAGNOSIS — Z95810 Presence of automatic (implantable) cardiac defibrillator: Secondary | ICD-10-CM

## 2015-10-01 NOTE — Telephone Encounter (Signed)
Spoke with pt and reminded pt of remote transmission that is due today. Pt verbalized understanding.   

## 2015-10-02 NOTE — Progress Notes (Signed)
Remote ICD transmission.   

## 2015-10-07 ENCOUNTER — Encounter: Payer: Self-pay | Admitting: Cardiology

## 2015-10-08 ENCOUNTER — Other Ambulatory Visit: Payer: Self-pay | Admitting: *Deleted

## 2015-10-08 MED ORDER — SIMVASTATIN 40 MG PO TABS: ORAL_TABLET | ORAL | 3 refills | Status: DC

## 2015-10-29 LAB — CUP PACEART REMOTE DEVICE CHECK
Brady Statistic AP VP Percent: 47.7 %
Brady Statistic AP VS Percent: 0.01 %
Brady Statistic RA Percent Paced: 47.71 %
Brady Statistic RV Percent Paced: 99.68 %
Date Time Interrogation Session: 20170929113830
HIGH POWER IMPEDANCE MEASURED VALUE: 42 Ohm
HighPow Impedance: 50 Ohm
Implantable Lead Implant Date: 20090619
Implantable Lead Model: 7120
Lead Channel Impedance Value: 304 Ohm
Lead Channel Impedance Value: 380 Ohm
Lead Channel Impedance Value: 4047 Ohm
Lead Channel Impedance Value: 4047 Ohm
Lead Channel Impedance Value: 4047 Ohm
Lead Channel Impedance Value: 4047 Ohm
Lead Channel Impedance Value: 4047 Ohm
Lead Channel Pacing Threshold Amplitude: 0.75 V
Lead Channel Pacing Threshold Pulse Width: 0.4 ms
Lead Channel Sensing Intrinsic Amplitude: 2.125 mV
Lead Channel Sensing Intrinsic Amplitude: 2.125 mV
Lead Channel Sensing Intrinsic Amplitude: 9.125 mV
Lead Channel Setting Pacing Amplitude: 2.5 V
Lead Channel Setting Pacing Amplitude: 3 V
Lead Channel Setting Pacing Pulse Width: 0.4 ms
Lead Channel Setting Sensing Sensitivity: 0.3 mV
MDC IDC LEAD IMPLANT DT: 20090619
MDC IDC LEAD LOCATION: 753859
MDC IDC LEAD LOCATION: 753860
MDC IDC MSMT BATTERY REMAINING LONGEVITY: 37 mo
MDC IDC MSMT BATTERY VOLTAGE: 2.94 V
MDC IDC MSMT LEADCHNL LV IMPEDANCE VALUE: 4047 Ohm
MDC IDC MSMT LEADCHNL LV IMPEDANCE VALUE: 4047 Ohm
MDC IDC MSMT LEADCHNL LV IMPEDANCE VALUE: 4047 Ohm
MDC IDC MSMT LEADCHNL LV IMPEDANCE VALUE: 4047 Ohm
MDC IDC MSMT LEADCHNL LV IMPEDANCE VALUE: 4047 Ohm
MDC IDC MSMT LEADCHNL RA IMPEDANCE VALUE: 399 Ohm
MDC IDC MSMT LEADCHNL RA PACING THRESHOLD AMPLITUDE: 2.5 V
MDC IDC MSMT LEADCHNL RA PACING THRESHOLD PULSEWIDTH: 0.4 ms
MDC IDC STAT BRADY AS VP PERCENT: 52.18 %
MDC IDC STAT BRADY AS VS PERCENT: 0.11 %

## 2015-12-31 ENCOUNTER — Ambulatory Visit (INDEPENDENT_AMBULATORY_CARE_PROVIDER_SITE_OTHER): Payer: Medicare Other | Admitting: *Deleted

## 2015-12-31 ENCOUNTER — Telehealth: Payer: Self-pay | Admitting: Cardiology

## 2015-12-31 DIAGNOSIS — I255 Ischemic cardiomyopathy: Secondary | ICD-10-CM | POA: Diagnosis not present

## 2015-12-31 NOTE — Telephone Encounter (Signed)
Attempted to confirm remote transmission with pt. No answer and was unable to leave a message.   

## 2016-01-01 ENCOUNTER — Encounter: Payer: Self-pay | Admitting: Cardiology

## 2016-01-01 NOTE — Progress Notes (Signed)
Remote ICD transmission.   

## 2016-01-08 LAB — CUP PACEART REMOTE DEVICE CHECK
Battery Remaining Longevity: 33 mo
Brady Statistic AP VS Percent: 0 %
Brady Statistic AS VP Percent: 66.05 %
Brady Statistic AS VS Percent: 0.16 %
Brady Statistic RA Percent Paced: 32.81 %
Date Time Interrogation Session: 20171228083623
HighPow Impedance: 42 Ohm
HighPow Impedance: 50 Ohm
Implantable Lead Implant Date: 20090619
Implantable Lead Implant Date: 20090619
Implantable Pulse Generator Implant Date: 20150408
Lead Channel Impedance Value: 399 Ohm
Lead Channel Impedance Value: 399 Ohm
Lead Channel Impedance Value: 4047 Ohm
Lead Channel Impedance Value: 4047 Ohm
Lead Channel Impedance Value: 4047 Ohm
Lead Channel Impedance Value: 4047 Ohm
Lead Channel Impedance Value: 4047 Ohm
Lead Channel Impedance Value: 4047 Ohm
Lead Channel Pacing Threshold Amplitude: 2.5 V
Lead Channel Pacing Threshold Pulse Width: 0.4 ms
Lead Channel Sensing Intrinsic Amplitude: 2.25 mV
Lead Channel Sensing Intrinsic Amplitude: 2.25 mV
Lead Channel Setting Sensing Sensitivity: 0.3 mV
MDC IDC LEAD LOCATION: 753859
MDC IDC LEAD LOCATION: 753860
MDC IDC MSMT BATTERY VOLTAGE: 2.95 V
MDC IDC MSMT LEADCHNL LV IMPEDANCE VALUE: 4047 Ohm
MDC IDC MSMT LEADCHNL LV IMPEDANCE VALUE: 4047 Ohm
MDC IDC MSMT LEADCHNL LV IMPEDANCE VALUE: 4047 Ohm
MDC IDC MSMT LEADCHNL LV IMPEDANCE VALUE: 4047 Ohm
MDC IDC MSMT LEADCHNL RA PACING THRESHOLD PULSEWIDTH: 0.4 ms
MDC IDC MSMT LEADCHNL RV IMPEDANCE VALUE: 266 Ohm
MDC IDC MSMT LEADCHNL RV PACING THRESHOLD AMPLITUDE: 0.75 V
MDC IDC MSMT LEADCHNL RV SENSING INTR AMPL: 9.125 mV
MDC IDC SET LEADCHNL RA PACING AMPLITUDE: 3 V
MDC IDC SET LEADCHNL RV PACING AMPLITUDE: 2.5 V
MDC IDC SET LEADCHNL RV PACING PULSEWIDTH: 0.4 ms
MDC IDC STAT BRADY AP VP PERCENT: 33.79 %
MDC IDC STAT BRADY RV PERCENT PACED: 99.65 %

## 2016-02-29 ENCOUNTER — Ambulatory Visit (INDEPENDENT_AMBULATORY_CARE_PROVIDER_SITE_OTHER): Payer: Medicare Other | Admitting: *Deleted

## 2016-02-29 DIAGNOSIS — I442 Atrioventricular block, complete: Secondary | ICD-10-CM | POA: Diagnosis not present

## 2016-02-29 LAB — CUP PACEART INCLINIC DEVICE CHECK
Date Time Interrogation Session: 20180226165402
Implantable Lead Implant Date: 20090619
Implantable Lead Location: 753859
Implantable Lead Location: 753860
MDC IDC LEAD IMPLANT DT: 20090619
MDC IDC PG IMPLANT DT: 20150408

## 2016-02-29 NOTE — Progress Notes (Signed)
Pt seen in clinic as walk in c/o ShOB with activity worsening over the past 2 weeks.   ICD check in clinic. Normal device function. Thresholds and sensing consistent with previous device measurements. Impedance trends stable over time. No evidence of any ventricular arrhythmias. No mode switches. Histogram A and V do not correlate with A histograms showing paced beats lower than the set lower rate. - sent to tech services for review. Atrial tracking recovery and V. Sense response turned off. Device programmed at appropriate safety margins. Device programmed to optimize intrinsic conduction. Estimated longevity 2.74yrs. ROV with SK 3/6.

## 2016-03-08 ENCOUNTER — Encounter: Payer: Medicare Other | Admitting: Internal Medicine

## 2016-03-11 ENCOUNTER — Encounter: Payer: Self-pay | Admitting: Internal Medicine

## 2016-03-11 ENCOUNTER — Ambulatory Visit (INDEPENDENT_AMBULATORY_CARE_PROVIDER_SITE_OTHER): Payer: Medicare Other | Admitting: Internal Medicine

## 2016-03-11 ENCOUNTER — Encounter (INDEPENDENT_AMBULATORY_CARE_PROVIDER_SITE_OTHER): Payer: Self-pay

## 2016-03-11 VITALS — BP 88/60 | HR 67 | Ht 68.0 in | Wt 189.8 lb

## 2016-03-11 DIAGNOSIS — I5022 Chronic systolic (congestive) heart failure: Secondary | ICD-10-CM | POA: Diagnosis not present

## 2016-03-11 DIAGNOSIS — Z79899 Other long term (current) drug therapy: Secondary | ICD-10-CM

## 2016-03-11 DIAGNOSIS — I255 Ischemic cardiomyopathy: Secondary | ICD-10-CM | POA: Diagnosis not present

## 2016-03-11 DIAGNOSIS — Z95810 Presence of automatic (implantable) cardiac defibrillator: Secondary | ICD-10-CM | POA: Diagnosis not present

## 2016-03-11 DIAGNOSIS — I472 Ventricular tachycardia, unspecified: Secondary | ICD-10-CM

## 2016-03-11 DIAGNOSIS — I428 Other cardiomyopathies: Secondary | ICD-10-CM | POA: Diagnosis not present

## 2016-03-11 LAB — CUP PACEART INCLINIC DEVICE CHECK
Brady Statistic AP VS Percent: 0 %
Brady Statistic AS VS Percent: 0.01 %
Brady Statistic RA Percent Paced: 26.08 %
Date Time Interrogation Session: 20180309135432
HIGH POWER IMPEDANCE MEASURED VALUE: 42 Ohm
HIGH POWER IMPEDANCE MEASURED VALUE: 47 Ohm
Implantable Lead Implant Date: 20090619
Implantable Lead Implant Date: 20090619
Implantable Lead Location: 753859
Implantable Lead Model: 5076
Lead Channel Impedance Value: 266 Ohm
Lead Channel Impedance Value: 4047 Ohm
Lead Channel Impedance Value: 4047 Ohm
Lead Channel Impedance Value: 4047 Ohm
Lead Channel Impedance Value: 4047 Ohm
Lead Channel Setting Pacing Amplitude: 2.5 V
Lead Channel Setting Pacing Amplitude: 3 V
Lead Channel Setting Pacing Pulse Width: 0.4 ms
Lead Channel Setting Sensing Sensitivity: 0.3 mV
MDC IDC LEAD LOCATION: 753860
MDC IDC MSMT BATTERY REMAINING LONGEVITY: 27 mo
MDC IDC MSMT BATTERY VOLTAGE: 2.94 V
MDC IDC MSMT LEADCHNL LV IMPEDANCE VALUE: 4047 Ohm
MDC IDC MSMT LEADCHNL LV IMPEDANCE VALUE: 4047 Ohm
MDC IDC MSMT LEADCHNL LV IMPEDANCE VALUE: 4047 Ohm
MDC IDC MSMT LEADCHNL LV IMPEDANCE VALUE: 4047 Ohm
MDC IDC MSMT LEADCHNL LV IMPEDANCE VALUE: 4047 Ohm
MDC IDC MSMT LEADCHNL LV IMPEDANCE VALUE: 4047 Ohm
MDC IDC MSMT LEADCHNL RA IMPEDANCE VALUE: 399 Ohm
MDC IDC MSMT LEADCHNL RV IMPEDANCE VALUE: 380 Ohm
MDC IDC PG IMPLANT DT: 20150408
MDC IDC STAT BRADY AP VP PERCENT: 26.79 %
MDC IDC STAT BRADY AS VP PERCENT: 73.2 %
MDC IDC STAT BRADY RV PERCENT PACED: 99.98 %

## 2016-03-11 MED ORDER — LISINOPRIL 5 MG PO TABS: 5.0000 mg | ORAL_TABLET | Freq: Every day | ORAL | 3 refills | Status: DC

## 2016-03-11 NOTE — Patient Instructions (Signed)
Medication Instructions: - Your physician has recommended you make the following change in your medication:  1) Stop entresto 2) Start lisinopril 5 mg- take one tablet by mouth once daily  Labwork: - Your physician recommends that you have lab work today: BMP/Magnesium  Procedures/Testing: - none ordered  Follow-Up: - Remote monitoring is used to monitor your Pacemaker of ICD from home. This monitoring reduces the number of office visits required to check your device to one time per year. It allows [place] to keep an eye on the functioning of your device to ensure it is working properly. You are scheduled for a device check from home on 06/13/16. You may send your transmission at any time that day. If you have a wireless device, the transmission will be sent automatically. After your physician reviews your transmission, you will receive a postcard with your next transmission date.  - Your physician wants you to follow-up in: 1 year with Dr. Caryl Comes. You will receive a reminder letter in the mail two months in advance. If you don't receive a letter, please call our office to schedule the follow-up appointment.  Any Additional Special Instructions Will Be Listed Below (If Applicable).     If you need a refill on your cardiac medications before your next appointment, please call your pharmacy.

## 2016-03-11 NOTE — Progress Notes (Signed)
seborrheic keratosis include Coumadin) forf Patient Care Team: Wallene Huh, MD as PCP - General (Cardiology)   HPI  [name] is a 66 y.o. male is seen in followup for ventricular tachycardia in the setting of ischemic and nonischemic heart disease. He is status post ICD implantation.  2010 because of symptoms of progressive shortness of breath, he underwent evaluation including an echo demonstrating an ejection fraction of 40% or so and his Myoview scan demonstrated no ischemia. Repeat myoview scan 8/13 demonstrated infarct with peri-infarct ischemia.catheterization February 2014 demonstrated no obstructive coronary disease.    DATE TEST    9/13 cATH EF 45% Nonobstructive CAs  9/15    ECHO   EF 30-35 %   1/17    ECHO   EF 40-45 %             he continues to struggle with shortness of breath which in his mind is worsening.He has a problem with post prandial dyspnea and discomfort  Some LH today.    And this has made BID entresto impossible so that he is taking it just at night  s    Past Medical History:  Diagnosis Date  . CAD (coronary artery disease)    a. s/p PCI/BMS of prox LAD in 1997;  b. 4/10 LHC: nonobs dzs. (pRCA 50-70%);  c.   Myoview 08/23/11 : Scar with peri-infarct ischemia infecting the entire septum with a corresponding wall motion abnormality, moderate risk scan, EF 42%.  . Chronic back pain    a. s/p multiple lumbar surgeries  . Complete heart block ([place])    a. h/o transient complete heart block in April 2009  . DJD (degenerative joint disease)   . GERD (gastroesophageal reflux disease)   . Hyperlipidemia   . Ischemic cardiomyopathy    a. 05/2009 Echo: EF 40%, mild to mod glob HK, Gr 2 dd, mild LVH, Triv AI, Mild MR, PASP 37-27mmHg  . 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 s/p ICD 1997;   b. 06/2007 ICD upgrade to MDT Concerto Bi-V though LV lead unable to be placed.  . Myasthenia gravis   . Sciatica     Past Surgical History:  Procedure  Laterality Date  . anterior lumbar interbody fusion  04/23/2007   L4-L5   . BI-VENTRICULAR IMPLANTABLE CARDIOVERTER DEFIBRILLATOR UPGRADE N/A 04/10/2013   failed CRTD upgrade  . BI-VENTRICULAR PACEMAKER UPGRADE N/A 10/14/2013   failed CRTD upgrade  . CARDIAC DEFIBRILLATOR PLACEMENT  2001; 2008   single chamber MDT ICD implanted for secondary prevention; gen change 2008; explantation 2009  . CORONARY ANGIOPLASTY WITH STENT PLACEMENT     bare metal stenting of the LAD in 1997  . GALLBLADDER SURGERY    . IMPLANTABLE CARDIOVERTER DEFIBRILLATOR IMPLANT  2009; 2015   MDT dual chamber right sided device implanted 2009 with gen change 2015; unable to place LV lead    Current Outpatient Prescriptions  Medication Sig Dispense Refill  . aspirin EC 81 MG tablet Take 1 tablet (81 mg total) by mouth daily.    Marland Kitchen azaTHIOprine (IMURAN) 50 MG tablet Take 150 mg by mouth daily.     Marland Kitchen BIDIL 20-37.5 MG tablet TAKE ONE TABLET BY MOUTH TWICE DAILY. 60 tablet 4  . carvedilol (COREG) 3.125 MG tablet Take 1 tablet (3.125 mg total) by mouth 2 (two) times daily. 180 tablet 1  . furosemide (LASIX) 40 MG tablet TAKE ONE & ONE-HALF TABLETS BY MOUTH ONCE DAILY. 45 tablet 5  . HYDROcodone-acetaminophen (  NORCO) 10-325 MG per tablet Take 1 tablet by mouth every 6 (six) hours as needed for severe pain.    Marland Kitchen ipratropium (ATROVENT) 0.03 % nasal spray Place 2 sprays into both nostrils 3 (three) times daily as needed for rhinitis.    Marland Kitchen KLOR-CON M20 20 MEQ tablet TAKE ONE TABLET BY MOUTH ONCE DAILY 30 tablet 6  . magnesium oxide (MAG-OX) 400 MG tablet Take 400 mg by mouth daily.     . Nutritional Supplements (GRAPESEED EXTRACT PO) Take 2 capsules by mouth daily.    Marland Kitchen omeprazole (PRILOSEC) 40 MG capsule Take 40 mg by mouth daily.    Marland Kitchen OVER THE COUNTER MEDICATION Take 1 tablet by mouth daily. Blue green algae    . OVER THE COUNTER MEDICATION Take 2 tablets by mouth daily. Digestive enzymes    . Polyvinyl Alcohol-Povidone (REFRESH  OP) Place 1 drop into both eyes daily as needed (red eyes).    . predniSONE (DELTASONE) 5 MG tablet Take 5 mg by mouth every other day.      . sacubitril-valsartan (ENTRESTO) 24-26 MG Take 1 tablet by mouth 2 (two) times daily.    . simvastatin (ZOCOR) 40 MG tablet Take one tablet (40 mg) by mouth once daily 90 tablet 3  . sotalol (BETAPACE) 120 MG tablet TAKE ONE TABLET BY MOUTH TWICE DAILY 60 tablet 6   No current facility-administered medications for this visit.     Allergies  Allergen Reactions  . Procainamide Other (See Comments)    Allergy is from [facility] records - pt is not aware of any reaction to this medicine  . Quinidine Other (See Comments)    Allergy is from [facility] records - pt is not aware of any reaction to this medicine    Review of Systems negative except from HPI and PMH  Physical Exam BP (!) 88/60   Pulse 67   Ht 5\' 8"  (1.727 m)   Wt 189 lb 12.8 oz (86.1 kg)   SpO2 95%   BMI 28.86 kg/m  Well developed and nourished in no acute distress HENT normal Neck supple with JVP-flat Clear Regular rate and rhythm, no murmurs or gallops Abd-soft with active BS No Clubbing cyanosis edema Skin-warm and dry A & Oriented  Grossly normal sensory and motor function  Skin Warm and Dry  ECG demonstrates sinus rhythm with synchronous RV apical pacing and left bundle branch block. Intervals 14/21/48  Assessment and  Plan   Ischemic and nonischemic cardiomyopathy  Ventricular tachycardia  Complete heart block with RV apical pacing  Chronic systolic heart failure  Hypotension   \High Risk Medication Surveillance  Implantable defibrillator Medtronic The patient's device was interrogated.  The information was reviewed. No changes were made in the programming.     Hypertension  GI distress/ nausea   Euvolemic continue current meds  LBBB  With LH on entresto, I spoke with DM from [facility]; daily entresto or daily ACE/ARB   Suggested the latter  No  intercurrent Ventricular tachycardia  Cardiomyopathy  40-45%   Have suggested he FU with PCP re belly pain   Needs sotalol labs

## 2016-03-12 LAB — BASIC METABOLIC PANEL
BUN / CREAT RATIO: 10 (ref 10–24)
BUN: 10 mg/dL (ref 8–27)
CHLORIDE: 97 mmol/L (ref 96–106)
CO2: 27 mmol/L (ref 18–29)
Calcium: 9.2 mg/dL (ref 8.6–10.2)
Creatinine, Ser: 1 mg/dL (ref 0.76–1.27)
GFR, EST AFRICAN AMERICAN: 90 mL/min/{1.73_m2} (ref >59)
GFR, EST NON AFRICAN AMERICAN: 78 mL/min/{1.73_m2} (ref >59)
Glucose: 99 mg/dL (ref 65–99)
Potassium: 4 mmol/L (ref 3.5–5.2)
Sodium: 141 mmol/L (ref 134–144)

## 2016-03-12 LAB — MAGNESIUM: Magnesium: 1.9 mg/dL (ref 1.6–2.3)

## 2016-03-15 ENCOUNTER — Telehealth: Payer: Self-pay

## 2016-03-15 NOTE — Telephone Encounter (Signed)
lmtcb for lab results

## 2016-03-28 ENCOUNTER — Other Ambulatory Visit: Payer: Self-pay | Admitting: Cardiology

## 2016-03-28 DIAGNOSIS — R079 Chest pain, unspecified: Secondary | ICD-10-CM

## 2016-04-11 ENCOUNTER — Encounter (HOSPITAL_COMMUNITY)
Admission: RE | Admit: 2016-04-11 | Discharge: 2016-04-11 | Disposition: A | Discharge status: Home / Self Care | Payer: Medicare Other | Source: Ambulatory Visit | Attending: Cardiology | Admitting: Cardiology

## 2016-04-11 DIAGNOSIS — R079 Chest pain, unspecified: Secondary | ICD-10-CM | POA: Diagnosis not present

## 2016-04-11 MED ORDER — REGADENOSON 0.4 MG/5ML IV SOLN
INTRAVENOUS | Status: AC
  Administered 2016-04-11: 0.4 mg via INTRAVENOUS
  Filled 2016-04-11: qty 5

## 2016-04-11 MED ORDER — TECHNETIUM TC 99M TETROFOSMIN IV KIT
10.0000 | PACK | Freq: Once | INTRAVENOUS | Status: AC | PRN
  Administered 2016-04-11: 10 via INTRAVENOUS

## 2016-04-11 MED ORDER — REGADENOSON 0.4 MG/5ML IV SOLN
0.4000 mg | Freq: Once | INTRAVENOUS | Status: AC
  Administered 2016-04-11: 0.4 mg via INTRAVENOUS

## 2016-04-11 MED ORDER — TECHNETIUM TC 99M TETROFOSMIN IV KIT
30.0000 | PACK | Freq: Once | INTRAVENOUS | Status: AC | PRN
  Administered 2016-04-11: 30 via INTRAVENOUS

## 2016-04-13 ENCOUNTER — Other Ambulatory Visit: Payer: Self-pay | Admitting: Nurse Practitioner

## 2016-04-13 ENCOUNTER — Other Ambulatory Visit: Payer: Self-pay | Admitting: Internal Medicine

## 2016-06-13 ENCOUNTER — Ambulatory Visit (INDEPENDENT_AMBULATORY_CARE_PROVIDER_SITE_OTHER): Payer: Medicare Other | Admitting: *Deleted

## 2016-06-13 DIAGNOSIS — I5022 Chronic systolic (congestive) heart failure: Secondary | ICD-10-CM

## 2016-06-13 DIAGNOSIS — I255 Ischemic cardiomyopathy: Secondary | ICD-10-CM

## 2016-06-13 NOTE — Progress Notes (Signed)
Remote ICD transmission.   

## 2016-06-14 LAB — CUP PACEART REMOTE DEVICE CHECK
Brady Statistic AP VP Percent: 32.29 %
Brady Statistic AS VP Percent: 67.69 %
Brady Statistic AS VS Percent: 0.01 %
Date Time Interrogation Session: 20180611092824
HighPow Impedance: 40 Ohm
HighPow Impedance: 45 Ohm
Implantable Lead Implant Date: 20090619
Implantable Lead Location: 753860
Implantable Lead Model: 5076
Lead Channel Impedance Value: 342 Ohm
Lead Channel Impedance Value: 4047 Ohm
Lead Channel Impedance Value: 4047 Ohm
Lead Channel Impedance Value: 4047 Ohm
Lead Channel Impedance Value: 4047 Ohm
Lead Channel Impedance Value: 4047 Ohm
Lead Channel Impedance Value: 4047 Ohm
Lead Channel Impedance Value: 4047 Ohm
Lead Channel Pacing Threshold Amplitude: 0.75 V
Lead Channel Pacing Threshold Pulse Width: 0.4 ms
Lead Channel Sensing Intrinsic Amplitude: 0.75 mV
Lead Channel Sensing Intrinsic Amplitude: 0.75 mV
Lead Channel Setting Pacing Amplitude: 2.5 V
MDC IDC LEAD IMPLANT DT: 20090619
MDC IDC LEAD LOCATION: 753859
MDC IDC MSMT BATTERY REMAINING LONGEVITY: 24 mo
MDC IDC MSMT BATTERY VOLTAGE: 2.94 V
MDC IDC MSMT LEADCHNL LV IMPEDANCE VALUE: 4047 Ohm
MDC IDC MSMT LEADCHNL LV IMPEDANCE VALUE: 4047 Ohm
MDC IDC MSMT LEADCHNL LV IMPEDANCE VALUE: 4047 Ohm
MDC IDC MSMT LEADCHNL RA IMPEDANCE VALUE: 399 Ohm
MDC IDC MSMT LEADCHNL RA PACING THRESHOLD AMPLITUDE: 2.5 V
MDC IDC MSMT LEADCHNL RV IMPEDANCE VALUE: 247 Ohm
MDC IDC MSMT LEADCHNL RV PACING THRESHOLD PULSEWIDTH: 0.4 ms
MDC IDC MSMT LEADCHNL RV SENSING INTR AMPL: 9.125 mV
MDC IDC PG IMPLANT DT: 20150408
MDC IDC SET LEADCHNL RA PACING AMPLITUDE: 3 V
MDC IDC SET LEADCHNL RV PACING PULSEWIDTH: 0.4 ms
MDC IDC SET LEADCHNL RV SENSING SENSITIVITY: 0.3 mV
MDC IDC STAT BRADY AP VS PERCENT: 0 %
MDC IDC STAT BRADY RA PERCENT PACED: 31.76 %
MDC IDC STAT BRADY RV PERCENT PACED: 99.97 %

## 2016-06-15 ENCOUNTER — Encounter: Payer: Self-pay | Admitting: Cardiology

## 2016-06-16 ENCOUNTER — Other Ambulatory Visit: Payer: Self-pay | Admitting: Nurse Practitioner

## 2016-06-29 IMAGING — DX DG CHEST 2V
2 series · 2 of 2 positions shown · non-contrast
Comparison: August 17, 2013

CLINICAL DATA: Acute onset dizziness. History of cardiac arrhythmia

EXAM:
CHEST  2 VIEW

[chest pa]
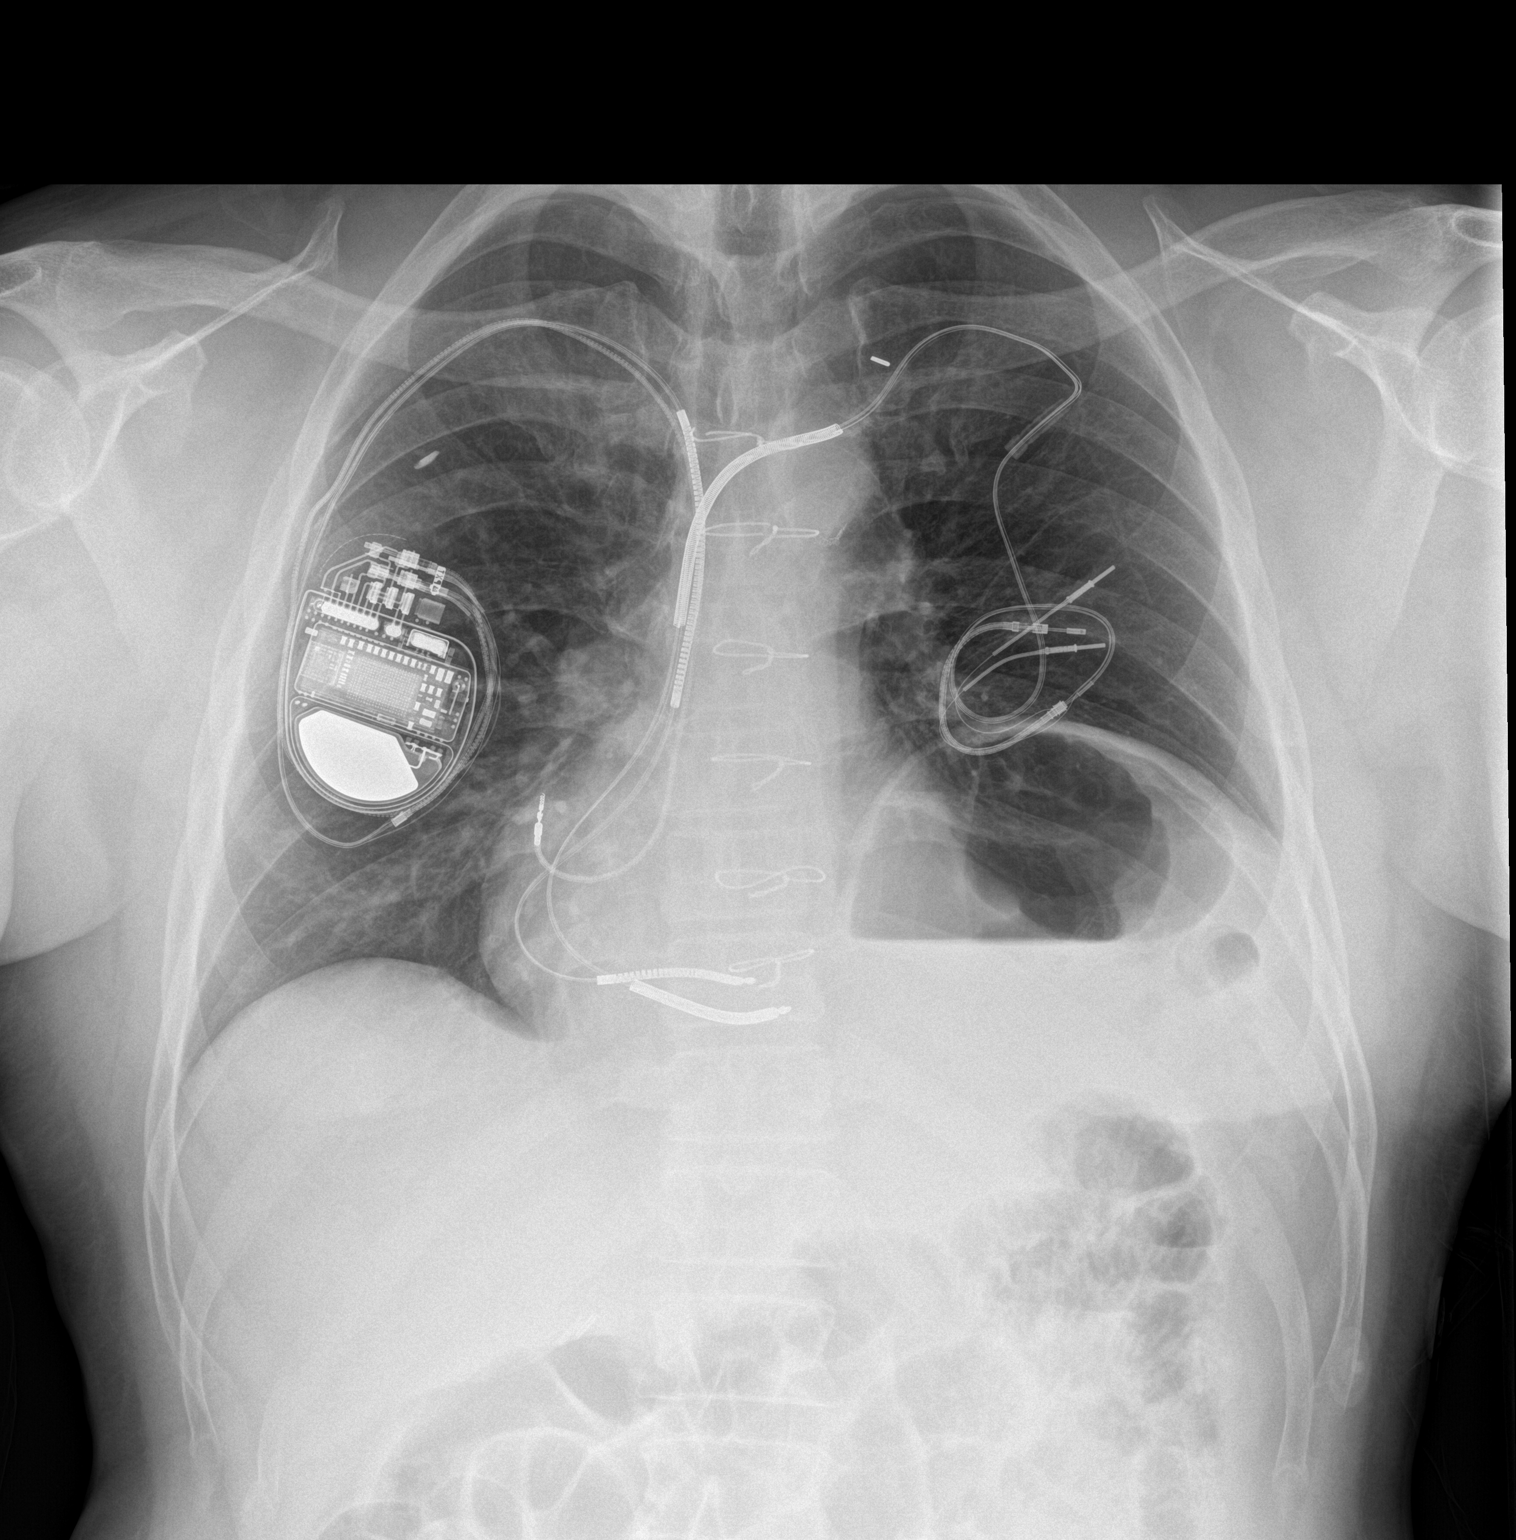

[chest lat]
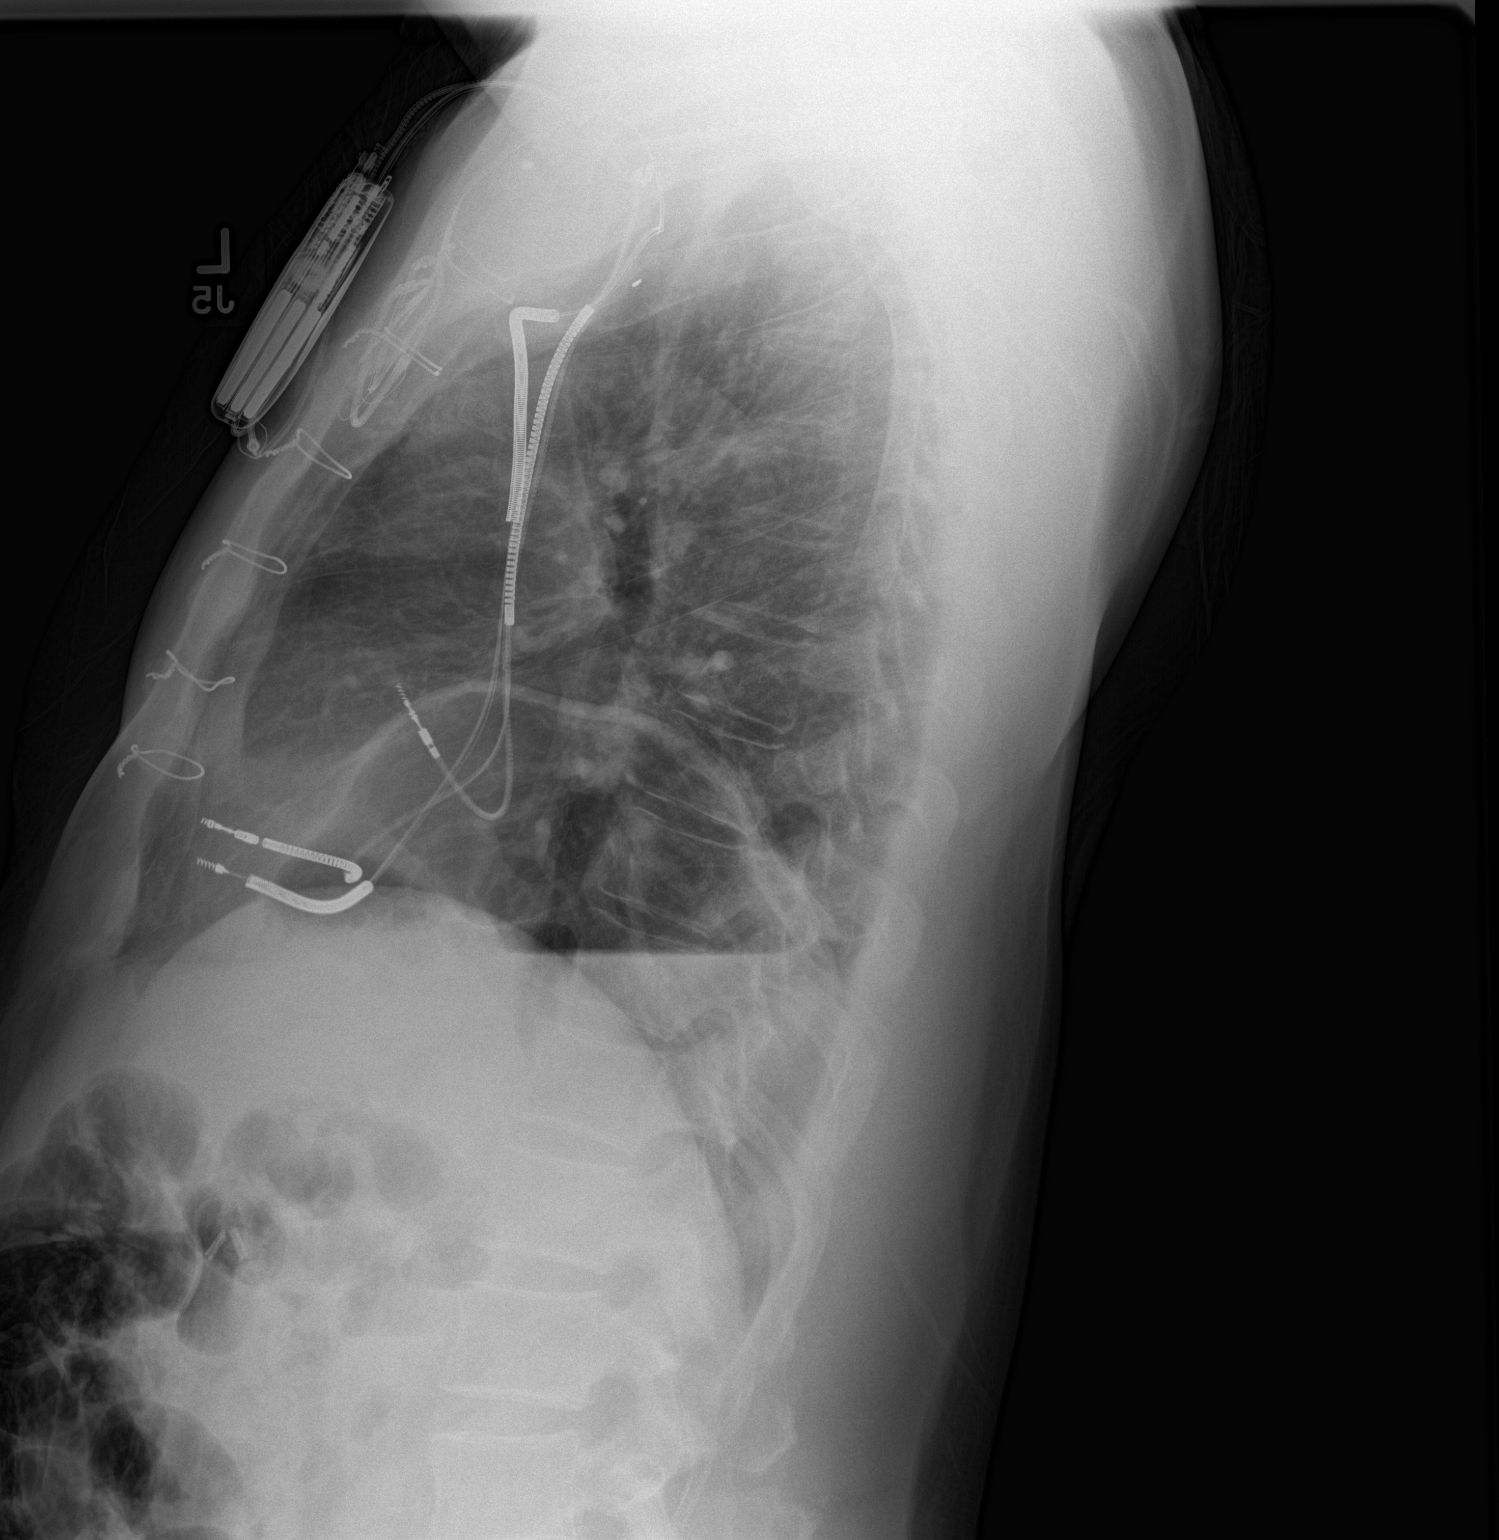

[2 of 2 positions shown; findings below may reference images not displayed]

FINDINGS: There is persistent elevation of the left hemidiaphragm. There is no
edema or consolidation. The heart size and pulmonary vascularity are
within normal limits. Pacemaker leads are attached to the right
atrium and right ventricle, stable. No adenopathy. No bone lesions.
IMPRESSION: No edema or consolidation. No change in cardiac silhouette. Stable
elevation of the left hemidiaphragm. No change in pacemaker lead
positions.

## 2016-09-12 ENCOUNTER — Ambulatory Visit (INDEPENDENT_AMBULATORY_CARE_PROVIDER_SITE_OTHER): Payer: Medicare Other | Admitting: *Deleted

## 2016-09-12 DIAGNOSIS — I255 Ischemic cardiomyopathy: Secondary | ICD-10-CM | POA: Diagnosis not present

## 2016-09-12 DIAGNOSIS — I5022 Chronic systolic (congestive) heart failure: Secondary | ICD-10-CM

## 2016-09-12 NOTE — Progress Notes (Signed)
Remote ICD transmission.   

## 2016-09-13 LAB — CUP PACEART REMOTE DEVICE CHECK
Battery Remaining Longevity: 23 mo
Brady Statistic AP VS Percent: 0 %
Brady Statistic AS VP Percent: 77.34 %
Brady Statistic AS VS Percent: 0.05 %
Brady Statistic RA Percent Paced: 22.55 %
HighPow Impedance: 40 Ohm
HighPow Impedance: 44 Ohm
Implantable Lead Implant Date: 20090619
Implantable Pulse Generator Implant Date: 20150408
Lead Channel Impedance Value: 247 Ohm
Lead Channel Impedance Value: 380 Ohm
Lead Channel Impedance Value: 399 Ohm
Lead Channel Impedance Value: 4047 Ohm
Lead Channel Impedance Value: 4047 Ohm
Lead Channel Impedance Value: 4047 Ohm
Lead Channel Impedance Value: 4047 Ohm
Lead Channel Impedance Value: 4047 Ohm
Lead Channel Impedance Value: 4047 Ohm
Lead Channel Pacing Threshold Amplitude: 0.875 V
Lead Channel Pacing Threshold Amplitude: 2.5 V
Lead Channel Pacing Threshold Pulse Width: 0.4 ms
Lead Channel Sensing Intrinsic Amplitude: 0.875 mV
Lead Channel Sensing Intrinsic Amplitude: 0.875 mV
Lead Channel Setting Pacing Amplitude: 2.5 V
Lead Channel Setting Sensing Sensitivity: 0.3 mV
MDC IDC LEAD IMPLANT DT: 20090619
MDC IDC LEAD LOCATION: 753859
MDC IDC LEAD LOCATION: 753860
MDC IDC MSMT BATTERY VOLTAGE: 2.93 V
MDC IDC MSMT LEADCHNL LV IMPEDANCE VALUE: 4047 Ohm
MDC IDC MSMT LEADCHNL LV IMPEDANCE VALUE: 4047 Ohm
MDC IDC MSMT LEADCHNL LV IMPEDANCE VALUE: 4047 Ohm
MDC IDC MSMT LEADCHNL LV IMPEDANCE VALUE: 4047 Ohm
MDC IDC MSMT LEADCHNL RA PACING THRESHOLD PULSEWIDTH: 0.4 ms
MDC IDC MSMT LEADCHNL RV SENSING INTR AMPL: 9.125 mV
MDC IDC SESS DTM: 20180910103524
MDC IDC SET LEADCHNL RA PACING AMPLITUDE: 3 V
MDC IDC SET LEADCHNL RV PACING PULSEWIDTH: 0.4 ms
MDC IDC STAT BRADY AP VP PERCENT: 22.61 %
MDC IDC STAT BRADY RV PERCENT PACED: 99.91 %

## 2016-09-14 ENCOUNTER — Encounter: Payer: Self-pay | Admitting: Cardiology

## 2016-09-18 ENCOUNTER — Other Ambulatory Visit: Payer: Self-pay | Admitting: Nurse Practitioner

## 2016-09-20 ENCOUNTER — Other Ambulatory Visit: Payer: Self-pay | Admitting: Internal Medicine

## 2016-09-20 MED ORDER — POTASSIUM CHLORIDE CRYS ER 20 MEQ PO TBCR: 20.0000 meq | EXTENDED_RELEASE_TABLET | Freq: Every day | ORAL | 5 refills | Status: DC

## 2016-09-25 ENCOUNTER — Other Ambulatory Visit: Payer: Self-pay | Admitting: Internal Medicine

## 2016-12-12 ENCOUNTER — Ambulatory Visit (INDEPENDENT_AMBULATORY_CARE_PROVIDER_SITE_OTHER): Payer: Medicare Other | Admitting: *Deleted

## 2016-12-12 DIAGNOSIS — I255 Ischemic cardiomyopathy: Secondary | ICD-10-CM | POA: Diagnosis not present

## 2016-12-12 DIAGNOSIS — I5022 Chronic systolic (congestive) heart failure: Secondary | ICD-10-CM

## 2016-12-12 NOTE — Progress Notes (Signed)
Remote ICD transmission.   

## 2016-12-14 LAB — CUP PACEART REMOTE DEVICE CHECK
Battery Remaining Longevity: 21 mo
Brady Statistic AS VP Percent: 71.45 %
Brady Statistic RA Percent Paced: 27.81 %
HIGH POWER IMPEDANCE MEASURED VALUE: 49 Ohm
HighPow Impedance: 43 Ohm
Implantable Lead Location: 753859
Implantable Lead Model: 5076
Implantable Lead Model: 7120
Implantable Pulse Generator Implant Date: 20150408
Lead Channel Impedance Value: 247 Ohm
Lead Channel Impedance Value: 4047 Ohm
Lead Channel Impedance Value: 4047 Ohm
Lead Channel Impedance Value: 4047 Ohm
Lead Channel Impedance Value: 4047 Ohm
Lead Channel Impedance Value: 4047 Ohm
Lead Channel Impedance Value: 437 Ohm
Lead Channel Pacing Threshold Pulse Width: 0.4 ms
Lead Channel Sensing Intrinsic Amplitude: 2 mV
Lead Channel Setting Pacing Amplitude: 2.5 V
Lead Channel Setting Pacing Pulse Width: 0.4 ms
MDC IDC LEAD IMPLANT DT: 20090619
MDC IDC LEAD IMPLANT DT: 20090619
MDC IDC LEAD LOCATION: 753860
MDC IDC MSMT BATTERY VOLTAGE: 2.94 V
MDC IDC MSMT LEADCHNL LV IMPEDANCE VALUE: 4047 Ohm
MDC IDC MSMT LEADCHNL LV IMPEDANCE VALUE: 4047 Ohm
MDC IDC MSMT LEADCHNL LV IMPEDANCE VALUE: 4047 Ohm
MDC IDC MSMT LEADCHNL LV IMPEDANCE VALUE: 4047 Ohm
MDC IDC MSMT LEADCHNL LV IMPEDANCE VALUE: 4047 Ohm
MDC IDC MSMT LEADCHNL RA PACING THRESHOLD AMPLITUDE: 2.5 V
MDC IDC MSMT LEADCHNL RA PACING THRESHOLD PULSEWIDTH: 0.4 ms
MDC IDC MSMT LEADCHNL RA SENSING INTR AMPL: 2 mV
MDC IDC MSMT LEADCHNL RV IMPEDANCE VALUE: 380 Ohm
MDC IDC MSMT LEADCHNL RV PACING THRESHOLD AMPLITUDE: 0.875 V
MDC IDC MSMT LEADCHNL RV SENSING INTR AMPL: 9.125 mV
MDC IDC SESS DTM: 20181210102703
MDC IDC SET LEADCHNL RA PACING AMPLITUDE: 3 V
MDC IDC SET LEADCHNL RV SENSING SENSITIVITY: 0.3 mV
MDC IDC STAT BRADY AP VP PERCENT: 28.43 %
MDC IDC STAT BRADY AP VS PERCENT: 0.01 %
MDC IDC STAT BRADY AS VS PERCENT: 0.11 %
MDC IDC STAT BRADY RV PERCENT PACED: 99.73 %

## 2016-12-16 ENCOUNTER — Encounter: Payer: Self-pay | Admitting: Cardiology

## 2017-03-13 ENCOUNTER — Ambulatory Visit (INDEPENDENT_AMBULATORY_CARE_PROVIDER_SITE_OTHER): Payer: Medicare Other | Admitting: *Deleted

## 2017-03-13 ENCOUNTER — Telehealth: Payer: Self-pay | Admitting: Cardiology

## 2017-03-13 DIAGNOSIS — I5022 Chronic systolic (congestive) heart failure: Secondary | ICD-10-CM

## 2017-03-13 DIAGNOSIS — I255 Ischemic cardiomyopathy: Secondary | ICD-10-CM

## 2017-03-13 NOTE — Telephone Encounter (Signed)
Spoke with pt and reminded pt of remote transmission that is due today. Pt verbalized understanding.   

## 2017-03-14 NOTE — Progress Notes (Signed)
Remote ICD transmission.   

## 2017-03-15 ENCOUNTER — Encounter: Payer: Self-pay | Admitting: Cardiology

## 2017-03-16 LAB — CUP PACEART REMOTE DEVICE CHECK
Battery Remaining Longevity: 23 mo
Battery Voltage: 2.93 V
Brady Statistic AP VP Percent: 10 %
Brady Statistic RA Percent Paced: 9.97 %
Brady Statistic RV Percent Paced: 99.63 %
Date Time Interrogation Session: 20190312032606
HIGH POWER IMPEDANCE MEASURED VALUE: 50 Ohm
HighPow Impedance: 42 Ohm
Implantable Lead Implant Date: 20090619
Implantable Lead Location: 753859
Implantable Lead Model: 5076
Implantable Lead Model: 7120
Implantable Pulse Generator Implant Date: 20150408
Lead Channel Impedance Value: 304 Ohm
Lead Channel Impedance Value: 399 Ohm
Lead Channel Impedance Value: 4047 Ohm
Lead Channel Impedance Value: 4047 Ohm
Lead Channel Impedance Value: 4047 Ohm
Lead Channel Impedance Value: 4047 Ohm
Lead Channel Impedance Value: 4047 Ohm
Lead Channel Impedance Value: 4047 Ohm
Lead Channel Impedance Value: 4047 Ohm
Lead Channel Impedance Value: 437 Ohm
Lead Channel Pacing Threshold Amplitude: 2.5 V
Lead Channel Pacing Threshold Pulse Width: 0.4 ms
Lead Channel Sensing Intrinsic Amplitude: 9.125 mV
Lead Channel Setting Pacing Amplitude: 3 V
Lead Channel Setting Pacing Pulse Width: 0.4 ms
Lead Channel Setting Sensing Sensitivity: 0.3 mV
MDC IDC LEAD IMPLANT DT: 20090619
MDC IDC LEAD LOCATION: 753860
MDC IDC MSMT LEADCHNL LV IMPEDANCE VALUE: 4047 Ohm
MDC IDC MSMT LEADCHNL LV IMPEDANCE VALUE: 4047 Ohm
MDC IDC MSMT LEADCHNL LV IMPEDANCE VALUE: 4047 Ohm
MDC IDC MSMT LEADCHNL RA PACING THRESHOLD PULSEWIDTH: 0.4 ms
MDC IDC MSMT LEADCHNL RA SENSING INTR AMPL: 2.125 mV
MDC IDC MSMT LEADCHNL RA SENSING INTR AMPL: 2.125 mV
MDC IDC MSMT LEADCHNL RV PACING THRESHOLD AMPLITUDE: 0.875 V
MDC IDC SET LEADCHNL RV PACING AMPLITUDE: 2.5 V
MDC IDC STAT BRADY AP VS PERCENT: 0 %
MDC IDC STAT BRADY AS VP PERCENT: 89.94 %
MDC IDC STAT BRADY AS VS PERCENT: 0.05 %

## 2017-05-31 ENCOUNTER — Other Ambulatory Visit: Payer: Self-pay | Admitting: Nurse Practitioner

## 2017-06-12 ENCOUNTER — Ambulatory Visit (INDEPENDENT_AMBULATORY_CARE_PROVIDER_SITE_OTHER): Payer: Medicare Other | Admitting: *Deleted

## 2017-06-12 DIAGNOSIS — I255 Ischemic cardiomyopathy: Secondary | ICD-10-CM | POA: Diagnosis not present

## 2017-06-12 DIAGNOSIS — I5022 Chronic systolic (congestive) heart failure: Secondary | ICD-10-CM

## 2017-06-12 LAB — CUP PACEART REMOTE DEVICE CHECK
Battery Remaining Longevity: 19 mo
Brady Statistic AP VS Percent: 0 %
Brady Statistic AS VP Percent: 75.13 %
Brady Statistic RA Percent Paced: 24.59 %
Date Time Interrogation Session: 20190610083724
HIGH POWER IMPEDANCE MEASURED VALUE: 41 Ohm
HighPow Impedance: 46 Ohm
Implantable Lead Implant Date: 20090619
Implantable Pulse Generator Implant Date: 20150408
Lead Channel Impedance Value: 209 Ohm
Lead Channel Impedance Value: 323 Ohm
Lead Channel Impedance Value: 4047 Ohm
Lead Channel Impedance Value: 4047 Ohm
Lead Channel Impedance Value: 4047 Ohm
Lead Channel Impedance Value: 4047 Ohm
Lead Channel Impedance Value: 4047 Ohm
Lead Channel Impedance Value: 4047 Ohm
Lead Channel Pacing Threshold Amplitude: 2.5 V
Lead Channel Pacing Threshold Pulse Width: 0.4 ms
Lead Channel Sensing Intrinsic Amplitude: 0.75 mV
Lead Channel Sensing Intrinsic Amplitude: 0.75 mV
Lead Channel Setting Sensing Sensitivity: 0.3 mV
MDC IDC LEAD IMPLANT DT: 20090619
MDC IDC LEAD LOCATION: 753859
MDC IDC LEAD LOCATION: 753860
MDC IDC MSMT BATTERY VOLTAGE: 2.92 V
MDC IDC MSMT LEADCHNL LV IMPEDANCE VALUE: 4047 Ohm
MDC IDC MSMT LEADCHNL LV IMPEDANCE VALUE: 4047 Ohm
MDC IDC MSMT LEADCHNL LV IMPEDANCE VALUE: 4047 Ohm
MDC IDC MSMT LEADCHNL LV IMPEDANCE VALUE: 4047 Ohm
MDC IDC MSMT LEADCHNL RA IMPEDANCE VALUE: 399 Ohm
MDC IDC MSMT LEADCHNL RA PACING THRESHOLD PULSEWIDTH: 0.4 ms
MDC IDC MSMT LEADCHNL RV PACING THRESHOLD AMPLITUDE: 1 V
MDC IDC MSMT LEADCHNL RV SENSING INTR AMPL: 9.125 mV
MDC IDC SET LEADCHNL RA PACING AMPLITUDE: 3 V
MDC IDC SET LEADCHNL RV PACING AMPLITUDE: 2.5 V
MDC IDC SET LEADCHNL RV PACING PULSEWIDTH: 0.4 ms
MDC IDC STAT BRADY AP VP PERCENT: 24.84 %
MDC IDC STAT BRADY AS VS PERCENT: 0.03 %
MDC IDC STAT BRADY RV PERCENT PACED: 99.94 %

## 2017-06-12 NOTE — Progress Notes (Signed)
Remote ICD transmission.   

## 2017-06-13 ENCOUNTER — Encounter: Payer: Self-pay | Admitting: Cardiology

## 2017-06-21 ENCOUNTER — Other Ambulatory Visit: Payer: Self-pay | Admitting: Internal Medicine

## 2017-07-10 ENCOUNTER — Other Ambulatory Visit: Payer: Self-pay | Admitting: Internal Medicine

## 2017-07-17 ENCOUNTER — Other Ambulatory Visit: Payer: Self-pay | Admitting: Internal Medicine

## 2017-07-17 ENCOUNTER — Other Ambulatory Visit: Payer: Self-pay | Admitting: Nurse Practitioner

## 2017-08-07 ENCOUNTER — Other Ambulatory Visit: Payer: Self-pay | Admitting: Internal Medicine

## 2017-08-07 ENCOUNTER — Other Ambulatory Visit: Payer: Self-pay | Admitting: Nurse Practitioner

## 2017-08-08 NOTE — Telephone Encounter (Signed)
Order Providers   Prescribing Provider Encounter Provider  Deboraha Sprang, MD Deboraha Sprang, MD  Outpatient Medication Detail    Disp Refills Start End   potassium chloride SA (K-DUR,KLOR-CON) 20 MEQ tablet 15 tablet 0 07/11/2017    Sig - Route: Take 1 tablet (20 mEq total) by mouth daily. Please make overdue appt with Dr. Caryl Comes before anymore refills. 3rd attempt - Oral   Sent to pharmacy as: potassium chloride SA (K-DUR,KLOR-CON) 20 MEQ tablet   Notes to Pharmacy: Please call our office to schedule an overdue yearly appointment with Dr. Caryl Comes before anymore refills. [phone]. Thank you 3rd attempt   E-Prescribing Status: Receipt confirmed by pharmacy (07/11/2017 2:18 PM EDT)   Pharmacy   [facility], [place] - [address]

## 2017-08-09 ENCOUNTER — Other Ambulatory Visit: Payer: Self-pay | Admitting: Nurse Practitioner

## 2017-08-09 ENCOUNTER — Other Ambulatory Visit: Payer: Self-pay | Admitting: Internal Medicine

## 2017-08-14 ENCOUNTER — Other Ambulatory Visit: Payer: Self-pay | Admitting: *Deleted

## 2017-08-14 ENCOUNTER — Telehealth: Payer: Self-pay | Admitting: Internal Medicine

## 2017-08-14 MED ORDER — POTASSIUM CHLORIDE CRYS ER 20 MEQ PO TBCR: 20.0000 meq | EXTENDED_RELEASE_TABLET | Freq: Every day | ORAL | 1 refills | Status: DC

## 2017-08-14 NOTE — Telephone Encounter (Signed)
New message   Pt states he has been out of medication for 2 weeks.   *STAT* If patient is at the pharmacy, call can be transferred to refill team.   1. Which medications need to be refilled? (please list name of each medication and dose if known) potassium   2. Which pharmacy/location (including street and city if local pharmacy) is medication to be sent to? [facility]  3. Do they need a 30 day or 90 day supply? [place]

## 2017-09-05 ENCOUNTER — Other Ambulatory Visit: Payer: Self-pay | Admitting: Internal Medicine

## 2017-09-11 ENCOUNTER — Ambulatory Visit (INDEPENDENT_AMBULATORY_CARE_PROVIDER_SITE_OTHER): Payer: Medicare Other | Admitting: *Deleted

## 2017-09-11 DIAGNOSIS — I5022 Chronic systolic (congestive) heart failure: Secondary | ICD-10-CM

## 2017-09-11 DIAGNOSIS — I255 Ischemic cardiomyopathy: Secondary | ICD-10-CM

## 2017-09-11 NOTE — Progress Notes (Signed)
Remote ICD transmission.   

## 2017-09-30 LAB — CUP PACEART REMOTE DEVICE CHECK
Battery Remaining Longevity: 16 mo
Brady Statistic AS VP Percent: 58.83 %
Date Time Interrogation Session: 20190909083825
HIGH POWER IMPEDANCE MEASURED VALUE: 42 Ohm
HighPow Impedance: 49 Ohm
Implantable Lead Implant Date: 20090619
Implantable Lead Location: 753860
Implantable Lead Model: 5076
Implantable Pulse Generator Implant Date: 20150408
Lead Channel Impedance Value: 399 Ohm
Lead Channel Impedance Value: 4047 Ohm
Lead Channel Impedance Value: 4047 Ohm
Lead Channel Impedance Value: 4047 Ohm
Lead Channel Impedance Value: 4047 Ohm
Lead Channel Impedance Value: 4047 Ohm
Lead Channel Pacing Threshold Amplitude: 1 V
Lead Channel Pacing Threshold Amplitude: 2.5 V
Lead Channel Pacing Threshold Pulse Width: 0.4 ms
Lead Channel Sensing Intrinsic Amplitude: 1.25 mV
Lead Channel Sensing Intrinsic Amplitude: 1.25 mV
Lead Channel Setting Sensing Sensitivity: 0.3 mV
MDC IDC LEAD IMPLANT DT: 20090619
MDC IDC LEAD LOCATION: 753859
MDC IDC MSMT BATTERY VOLTAGE: 2.91 V
MDC IDC MSMT LEADCHNL LV IMPEDANCE VALUE: 4047 Ohm
MDC IDC MSMT LEADCHNL LV IMPEDANCE VALUE: 4047 Ohm
MDC IDC MSMT LEADCHNL LV IMPEDANCE VALUE: 4047 Ohm
MDC IDC MSMT LEADCHNL LV IMPEDANCE VALUE: 4047 Ohm
MDC IDC MSMT LEADCHNL LV IMPEDANCE VALUE: 4047 Ohm
MDC IDC MSMT LEADCHNL RA IMPEDANCE VALUE: 437 Ohm
MDC IDC MSMT LEADCHNL RV IMPEDANCE VALUE: 304 Ohm
MDC IDC MSMT LEADCHNL RV PACING THRESHOLD PULSEWIDTH: 0.4 ms
MDC IDC MSMT LEADCHNL RV SENSING INTR AMPL: 9.125 mV
MDC IDC SET LEADCHNL RA PACING AMPLITUDE: 3 V
MDC IDC SET LEADCHNL RV PACING AMPLITUDE: 2.5 V
MDC IDC SET LEADCHNL RV PACING PULSEWIDTH: 0.4 ms
MDC IDC STAT BRADY AP VP PERCENT: 41.16 %
MDC IDC STAT BRADY AP VS PERCENT: 0 %
MDC IDC STAT BRADY AS VS PERCENT: 0.01 %
MDC IDC STAT BRADY RA PERCENT PACED: 40.15 %
MDC IDC STAT BRADY RV PERCENT PACED: 99.95 %

## 2017-10-02 ENCOUNTER — Other Ambulatory Visit: Payer: Self-pay | Admitting: Internal Medicine

## 2017-10-18 ENCOUNTER — Encounter: Payer: Self-pay | Admitting: Internal Medicine

## 2017-10-18 ENCOUNTER — Ambulatory Visit (INDEPENDENT_AMBULATORY_CARE_PROVIDER_SITE_OTHER): Payer: Medicare Other | Admitting: Internal Medicine

## 2017-10-18 VITALS — BP 124/62 | HR 79 | Ht 68.0 in | Wt 184.0 lb

## 2017-10-18 DIAGNOSIS — Z79899 Other long term (current) drug therapy: Secondary | ICD-10-CM

## 2017-10-18 DIAGNOSIS — I472 Ventricular tachycardia, unspecified: Secondary | ICD-10-CM

## 2017-10-18 DIAGNOSIS — E785 Hyperlipidemia, unspecified: Secondary | ICD-10-CM

## 2017-10-18 DIAGNOSIS — I442 Atrioventricular block, complete: Secondary | ICD-10-CM | POA: Diagnosis not present

## 2017-10-18 DIAGNOSIS — I5022 Chronic systolic (congestive) heart failure: Secondary | ICD-10-CM | POA: Diagnosis not present

## 2017-10-18 DIAGNOSIS — Z95810 Presence of automatic (implantable) cardiac defibrillator: Secondary | ICD-10-CM

## 2017-10-18 DIAGNOSIS — I255 Ischemic cardiomyopathy: Secondary | ICD-10-CM

## 2017-10-18 LAB — CBC
Hematocrit: 42.2 % (ref 37.5–51.0)
Hemoglobin: 14.4 g/dL (ref 13.0–17.7)
MCH: 32.3 pg (ref 26.6–33.0)
MCHC: 34.1 g/dL (ref 31.5–35.7)
MCV: 95 fL (ref 79–97)
PLATELETS: 226 10*3/uL (ref 150–450)
RBC: 4.46 x10E6/uL (ref 4.14–5.80)
RDW: 13.3 % (ref 12.3–15.4)
WBC: 4.2 10*3/uL (ref 3.4–10.8)

## 2017-10-18 LAB — LIPID PANEL
CHOL/HDL RATIO: 2.9 ratio (ref 0.0–5.0)
Cholesterol, Total: 149 mg/dL (ref 100–199)
HDL: 51 mg/dL (ref >39)
LDL Calculated: 86 mg/dL (ref 0–99)
Triglycerides: 62 mg/dL (ref 0–149)
VLDL Cholesterol Cal: 12 mg/dL (ref 5–40)

## 2017-10-18 LAB — HEPATIC FUNCTION PANEL
ALBUMIN: 4.2 g/dL (ref 3.6–4.8)
ALK PHOS: 87 IU/L (ref 39–117)
ALT: 22 IU/L (ref 0–44)
AST: 22 IU/L (ref 0–40)
BILIRUBIN TOTAL: 0.4 mg/dL (ref 0.0–1.2)
Bilirubin, Direct: 0.15 mg/dL (ref 0.00–0.40)
Total Protein: 7.1 g/dL (ref 6.0–8.5)

## 2017-10-18 LAB — BASIC METABOLIC PANEL
BUN/Creatinine Ratio: 12 (ref 10–24)
BUN: 11 mg/dL (ref 8–27)
CHLORIDE: 100 mmol/L (ref 96–106)
CO2: 25 mmol/L (ref 20–29)
Calcium: 8.9 mg/dL (ref 8.6–10.2)
Creatinine, Ser: 0.92 mg/dL (ref 0.76–1.27)
GFR calc Af Amer: 98 mL/min/{1.73_m2} (ref >59)
GFR, EST NON AFRICAN AMERICAN: 85 mL/min/{1.73_m2} (ref >59)
GLUCOSE: 112 mg/dL — AB (ref 65–99)
Potassium: 4.4 mmol/L (ref 3.5–5.2)
SODIUM: 141 mmol/L (ref 134–144)

## 2017-10-18 LAB — MAGNESIUM: MAGNESIUM: 1.8 mg/dL (ref 1.6–2.3)

## 2017-10-18 NOTE — Patient Instructions (Signed)
Medication Instructions:  Your physician recommends that you continue on your current medications as directed. Please refer to the Current Medication list given to you today.  Labwork: You will have labs drawn today: CBC, BMP, Mg, LFT, Lipids  Testing/Procedures: None ordered.  Follow-Up:  Your physician recommends that you schedule a follow-up appointment in:   6 months with Dr Caryl Comes  Remote monitoring is used to monitor your ICD from home. This monitoring reduces the number of office visits required to check your device to one time per year. It allows [place] to keep an eye on the functioning of your device to ensure it is working properly. You are scheduled for a device check from home on 12/11/2017. You may send your transmission at any time that day. If you have a wireless device, the transmission will be sent automatically. After your physician reviews your transmission, you will receive a postcard with your next transmission date.    Any Other Special Instructions Will Be Listed Below (If Applicable).     If you need a refill on your cardiac medications before your next appointment, please call your pharmacy.

## 2017-10-18 NOTE — Progress Notes (Signed)
seborrheic keratosis include Coumadin) forf Patient Care Team: Charolette Forward, MD as PCP - General (Cardiology)   HPI  [name] is a 67 y.o. male is seen in followup for ventricular tachycardia in the setting of ischemic and nonischemic heart disease. He is status post ICD implantation.  He has multiple failed attempts at left ventricular lead placement    2010 because of symptoms of progressive shortness of breath, he underwent evaluation including an echo demonstrating an ejection fraction of 40% or so and his Myoview scan demonstrated no ischemia. Repeat myoview scan 8/13 demonstrated infarct with peri-infarct ischemia.catheterization February 2014 demonstrated no obstructive coronary disease.    DATE TEST EF   9/13 CATH  45% Nonobstructive CAs  9/15    ECHO  30-35 %   1/17    ECHO  40-45 %            Date Cr K Hgb  3/18 1.0 4.0 15         The patient denies chest pain, shortness of breath, nocturnal dyspnea, orthopnea or peripheral edema.  There have been no palpitations, lightheadedness or syncope.   MG followed at [facility]   Past Medical History:  Diagnosis Date  . CAD (coronary artery disease)    a. s/p PCI/BMS of prox LAD in 1997;  b. 4/10 LHC: nonobs dzs. (pRCA 50-70%);  c.   Myoview 08/23/11 : Scar with peri-infarct ischemia infecting the entire septum with a corresponding wall motion abnormality, moderate risk scan, EF 42%.  . Chronic back pain    a. s/p multiple lumbar surgeries  . Complete heart block ([place])    a. h/o transient complete heart block in April 2009  . DJD (degenerative joint disease)   . GERD (gastroesophageal reflux disease)   . Hyperlipidemia   . Ischemic cardiomyopathy    a. 05/2009 Echo: EF 40%, mild to mod glob HK, Gr 2 dd, mild LVH, Triv AI, Mild MR, PASP 37-54mmHg  . 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 s/p ICD 1997;   b. 06/2007 ICD upgrade to MDT Concerto Bi-V though LV lead unable to be placed.  . Myasthenia gravis   .  Sciatica     Past Surgical History:  Procedure Laterality Date  . anterior lumbar interbody fusion  04/23/2007   L4-L5   . BI-VENTRICULAR IMPLANTABLE CARDIOVERTER DEFIBRILLATOR UPGRADE N/A 04/10/2013   failed CRTD upgrade  . BI-VENTRICULAR PACEMAKER UPGRADE N/A 10/14/2013   failed CRTD upgrade  . CARDIAC DEFIBRILLATOR PLACEMENT  2001; 2008   single chamber MDT ICD implanted for secondary prevention; gen change 2008; explantation 2009  . CORONARY ANGIOPLASTY WITH STENT PLACEMENT     bare metal stenting of the LAD in 1997  . GALLBLADDER SURGERY    . IMPLANTABLE CARDIOVERTER DEFIBRILLATOR IMPLANT  2009; 2015   MDT dual chamber right sided device implanted 2009 with gen change 2015; unable to place LV lead    Current Outpatient Medications  Medication Sig Dispense Refill  . aspirin EC 81 MG tablet Take 1 tablet (81 mg total) by mouth daily.    Marland Kitchen azaTHIOprine (IMURAN) 50 MG tablet Take 150 mg by mouth daily.     . carvedilol (COREG) 3.125 MG tablet Take 1 tablet (3.125 mg total) by mouth 2 (two) times daily with a meal. Please make overdue appt with Dr. Caryl Comes. 2nd attempt 30 tablet 0  . furosemide (LASIX) 40 MG tablet TAKE 1 & 1/2 (ONE & ONE-HALF) TABLETS BY MOUTH ONCE DAILY 45 tablet  0  . HYDROcodone-acetaminophen (NORCO) 10-325 MG per tablet Take 1 tablet by mouth every 6 (six) hours as needed for severe pain.    Marland Kitchen ipratropium (ATROVENT) 0.03 % nasal spray Place 2 sprays into both nostrils 3 (three) times daily as needed for rhinitis.    Marland Kitchen lisinopril (PRINIVIL,ZESTRIL) 5 MG tablet Take 1 tablet (5 mg total) by mouth daily. 90 tablet 3  . Nutritional Supplements (GRAPESEED EXTRACT PO) Take 2 capsules by mouth daily.    Marland Kitchen omeprazole (PRILOSEC) 40 MG capsule Take 40 mg by mouth daily.    Marland Kitchen OVER THE COUNTER MEDICATION Take 1 tablet by mouth daily. Blue green algae    . OVER THE COUNTER MEDICATION Take 2 tablets by mouth daily. Digestive enzymes    . Polyvinyl Alcohol-Povidone (REFRESH OP)  Place 1 drop into both eyes daily as needed (red eyes).    . potassium chloride SA (K-DUR,KLOR-CON) 20 MEQ tablet Take 1 tablet (20 mEq total) by mouth daily. Patient must keep upcoming appt for further refills. 30 tablet 1  . predniSONE (DELTASONE) 5 MG tablet Take 5 mg by mouth every other day.      . simvastatin (ZOCOR) 40 MG tablet TAKE ONE TABLET BY MOUTH ONCE DAILY 90 tablet 1  . sotalol (BETAPACE) 120 MG tablet Take 1 tablet by mouth two times daily. Patient is overdue for an appointment and needs to call and schedule for further refills 2nd attempt 30 tablet 0   No current facility-administered medications for this visit.     Allergies  Allergen Reactions  . Procainamide Other (See Comments)    Allergy is from [facility] records - pt is not aware of any reaction to this medicine  . Quinidine Other (See Comments)    Allergy is from [facility] records - pt is not aware of any reaction to this medicine    Review of Systems negative except from HPI and PMH  Physical Exam BP 124/62   Pulse 79   Ht 5\' 8"  (1.727 m)   Wt 184 lb (83.5 kg)   BMI 27.98 kg/m  Well developed and nourished in no acute distress HENT normal Neck supple with JVP-flat Clear Device pocket well healed; without hematoma or erythema.  There is no tethering  Regular rate and rhythm, no murmurs or gallops Abd-soft with active BS No Clubbing cyanosis edema Skin-warm and dry A & Oriented  Grossly normal sensory and motor function    ECG demonstrates  Sinus with atrial bigeminy and paced bigeminy P-synchronous/ AV  pacing   Assessment and  Plan   Ischemic and nonischemic cardiomyopathy  Ventricular tachycardia  Complete heart block with RV apical pacing  Chronic systolic heart failure  \High Risk Medication Surveillance  Implantable defibrillator Medtronic The patient's device was interrogated and the information was fully reviewed.  The device was reprogrammed to decrease RA pacing 2/2  elevated RA threshold .     Hypertension      BP well controlled  Euvolemic continue current meds  Without symptoms of ischemia  No intercurrent Ventricular tachycardia  Need to check lipid on simva  Surveillance labs on sotalol   We spent more than 50% of our >25 min visit in face to face counseling regarding the above

## 2017-10-29 ENCOUNTER — Other Ambulatory Visit: Payer: Self-pay | Admitting: Internal Medicine

## 2017-11-22 ENCOUNTER — Other Ambulatory Visit: Payer: Self-pay | Admitting: Internal Medicine

## 2017-12-11 ENCOUNTER — Ambulatory Visit (INDEPENDENT_AMBULATORY_CARE_PROVIDER_SITE_OTHER): Payer: Medicare Other

## 2017-12-11 DIAGNOSIS — I5022 Chronic systolic (congestive) heart failure: Secondary | ICD-10-CM

## 2017-12-11 DIAGNOSIS — I472 Ventricular tachycardia, unspecified: Secondary | ICD-10-CM

## 2017-12-12 NOTE — Progress Notes (Signed)
Remote ICD transmission.   

## 2018-01-24 LAB — CUP PACEART REMOTE DEVICE CHECK
Battery Remaining Longevity: 18 mo
Brady Statistic AP VS Percent: 0 %
Brady Statistic AS VP Percent: 99.16 %
Brady Statistic AS VS Percent: 0.01 %
Date Time Interrogation Session: 20191209072704
HighPow Impedance: 43 Ohm
HighPow Impedance: 50 Ohm
Implantable Lead Implant Date: 20090619
Implantable Lead Implant Date: 20090619
Implantable Lead Model: 5076
Implantable Pulse Generator Implant Date: 20150408
Lead Channel Impedance Value: 399 Ohm
Lead Channel Impedance Value: 4047 Ohm
Lead Channel Impedance Value: 4047 Ohm
Lead Channel Impedance Value: 4047 Ohm
Lead Channel Impedance Value: 4047 Ohm
Lead Channel Impedance Value: 4047 Ohm
Lead Channel Impedance Value: 4047 Ohm
Lead Channel Pacing Threshold Amplitude: 2.5 V
Lead Channel Pacing Threshold Pulse Width: 0.4 ms
Lead Channel Sensing Intrinsic Amplitude: 1 mV
Lead Channel Sensing Intrinsic Amplitude: 1 mV
Lead Channel Setting Pacing Amplitude: 2.5 V
Lead Channel Setting Sensing Sensitivity: 0.3 mV
MDC IDC LEAD LOCATION: 753859
MDC IDC LEAD LOCATION: 753860
MDC IDC MSMT BATTERY VOLTAGE: 2.91 V
MDC IDC MSMT LEADCHNL LV IMPEDANCE VALUE: 4047 Ohm
MDC IDC MSMT LEADCHNL LV IMPEDANCE VALUE: 4047 Ohm
MDC IDC MSMT LEADCHNL LV IMPEDANCE VALUE: 4047 Ohm
MDC IDC MSMT LEADCHNL LV IMPEDANCE VALUE: 4047 Ohm
MDC IDC MSMT LEADCHNL RA IMPEDANCE VALUE: 437 Ohm
MDC IDC MSMT LEADCHNL RA PACING THRESHOLD PULSEWIDTH: 0.4 ms
MDC IDC MSMT LEADCHNL RV IMPEDANCE VALUE: 304 Ohm
MDC IDC MSMT LEADCHNL RV PACING THRESHOLD AMPLITUDE: 1.125 V
MDC IDC MSMT LEADCHNL RV SENSING INTR AMPL: 9.125 mV
MDC IDC SET LEADCHNL RA PACING AMPLITUDE: 3 V
MDC IDC SET LEADCHNL RV PACING PULSEWIDTH: 0.4 ms
MDC IDC STAT BRADY AP VP PERCENT: 0.83 %
MDC IDC STAT BRADY RA PERCENT PACED: 0.83 %
MDC IDC STAT BRADY RV PERCENT PACED: 99.95 %

## 2018-03-12 ENCOUNTER — Ambulatory Visit (INDEPENDENT_AMBULATORY_CARE_PROVIDER_SITE_OTHER): Payer: Medicare Other | Admitting: *Deleted

## 2018-03-12 DIAGNOSIS — I472 Ventricular tachycardia, unspecified: Secondary | ICD-10-CM

## 2018-03-12 DIAGNOSIS — I5022 Chronic systolic (congestive) heart failure: Secondary | ICD-10-CM

## 2018-03-13 LAB — CUP PACEART REMOTE DEVICE CHECK
Battery Remaining Longevity: 20 mo
Battery Voltage: 2.9 V
Brady Statistic AP VP Percent: 0.27 %
Brady Statistic AS VS Percent: 0.05 %
Brady Statistic RA Percent Paced: 0.27 %
Brady Statistic RV Percent Paced: 99.88 %
HIGH POWER IMPEDANCE MEASURED VALUE: 47 Ohm
HighPow Impedance: 41 Ohm
Implantable Lead Implant Date: 20090619
Implantable Lead Location: 753859
Implantable Lead Model: 7120
Implantable Pulse Generator Implant Date: 20150408
Lead Channel Impedance Value: 266 Ohm
Lead Channel Impedance Value: 399 Ohm
Lead Channel Impedance Value: 4047 Ohm
Lead Channel Impedance Value: 4047 Ohm
Lead Channel Impedance Value: 4047 Ohm
Lead Channel Impedance Value: 4047 Ohm
Lead Channel Impedance Value: 4047 Ohm
Lead Channel Impedance Value: 4047 Ohm
Lead Channel Impedance Value: 437 Ohm
Lead Channel Pacing Threshold Amplitude: 2.5 V
Lead Channel Pacing Threshold Pulse Width: 0.4 ms
Lead Channel Sensing Intrinsic Amplitude: 9.125 mV
Lead Channel Setting Pacing Amplitude: 3 V
Lead Channel Setting Pacing Pulse Width: 0.4 ms
Lead Channel Setting Sensing Sensitivity: 0.3 mV
MDC IDC LEAD IMPLANT DT: 20090619
MDC IDC LEAD LOCATION: 753860
MDC IDC MSMT LEADCHNL LV IMPEDANCE VALUE: 4047 Ohm
MDC IDC MSMT LEADCHNL LV IMPEDANCE VALUE: 4047 Ohm
MDC IDC MSMT LEADCHNL LV IMPEDANCE VALUE: 4047 Ohm
MDC IDC MSMT LEADCHNL LV IMPEDANCE VALUE: 4047 Ohm
MDC IDC MSMT LEADCHNL RA PACING THRESHOLD PULSEWIDTH: 0.4 ms
MDC IDC MSMT LEADCHNL RA SENSING INTR AMPL: 1.25 mV
MDC IDC MSMT LEADCHNL RA SENSING INTR AMPL: 1.25 mV
MDC IDC MSMT LEADCHNL RV PACING THRESHOLD AMPLITUDE: 1.125 V
MDC IDC SESS DTM: 20200310032504
MDC IDC SET LEADCHNL RV PACING AMPLITUDE: 2.5 V
MDC IDC STAT BRADY AP VS PERCENT: 0 %
MDC IDC STAT BRADY AS VP PERCENT: 99.69 %

## 2018-03-20 NOTE — Progress Notes (Signed)
Remote ICD transmission.   

## 2018-04-20 ENCOUNTER — Telehealth: Payer: Self-pay

## 2018-04-20 NOTE — Telephone Encounter (Signed)
I tried to call patient about his appointment on Tuesday, there was no answer and mailbox was full.

## 2018-04-24 ENCOUNTER — Other Ambulatory Visit: Payer: Self-pay

## 2018-04-24 ENCOUNTER — Telehealth: Payer: Medicare Other | Admitting: Internal Medicine

## 2018-04-25 NOTE — Telephone Encounter (Signed)
Could not reach patient for Telehealth visit with Dr. Caryl Comes on 04/24/18.

## 2018-06-11 ENCOUNTER — Ambulatory Visit (INDEPENDENT_AMBULATORY_CARE_PROVIDER_SITE_OTHER): Payer: Medicare Other | Admitting: *Deleted

## 2018-06-11 DIAGNOSIS — I472 Ventricular tachycardia, unspecified: Secondary | ICD-10-CM

## 2018-06-11 DIAGNOSIS — I5022 Chronic systolic (congestive) heart failure: Secondary | ICD-10-CM

## 2018-06-11 LAB — CUP PACEART REMOTE DEVICE CHECK
Battery Remaining Longevity: 17 mo
Battery Voltage: 2.89 V
Brady Statistic AP VP Percent: 0.65 %
Brady Statistic AP VS Percent: 0 %
Brady Statistic AS VP Percent: 99.26 %
Brady Statistic AS VS Percent: 0.09 %
Brady Statistic RA Percent Paced: 0.65 %
Brady Statistic RV Percent Paced: 99.78 %
Date Time Interrogation Session: 20200608062726
HighPow Impedance: 40 Ohm
HighPow Impedance: 48 Ohm
Implantable Lead Implant Date: 20090619
Implantable Lead Implant Date: 20090619
Implantable Lead Location: 753859
Implantable Lead Location: 753860
Implantable Lead Model: 5076
Implantable Lead Model: 7120
Implantable Pulse Generator Implant Date: 20150408
Lead Channel Impedance Value: 266 Ohm
Lead Channel Impedance Value: 380 Ohm
Lead Channel Impedance Value: 399 Ohm
Lead Channel Impedance Value: 4047 Ohm
Lead Channel Impedance Value: 4047 Ohm
Lead Channel Impedance Value: 4047 Ohm
Lead Channel Impedance Value: 4047 Ohm
Lead Channel Impedance Value: 4047 Ohm
Lead Channel Impedance Value: 4047 Ohm
Lead Channel Impedance Value: 4047 Ohm
Lead Channel Impedance Value: 4047 Ohm
Lead Channel Impedance Value: 4047 Ohm
Lead Channel Impedance Value: 4047 Ohm
Lead Channel Pacing Threshold Amplitude: 1.125 V
Lead Channel Pacing Threshold Amplitude: 2.5 V
Lead Channel Pacing Threshold Pulse Width: 0.4 ms
Lead Channel Pacing Threshold Pulse Width: 0.4 ms
Lead Channel Sensing Intrinsic Amplitude: 1.25 mV
Lead Channel Sensing Intrinsic Amplitude: 1.25 mV
Lead Channel Sensing Intrinsic Amplitude: 8.875 mV
Lead Channel Sensing Intrinsic Amplitude: 8.875 mV
Lead Channel Setting Pacing Amplitude: 2.5 V
Lead Channel Setting Pacing Amplitude: 3 V
Lead Channel Setting Pacing Pulse Width: 0.4 ms
Lead Channel Setting Sensing Sensitivity: 0.3 mV

## 2018-06-19 NOTE — Progress Notes (Signed)
Remote ICD transmission.   

## 2018-09-11 ENCOUNTER — Ambulatory Visit (INDEPENDENT_AMBULATORY_CARE_PROVIDER_SITE_OTHER): Payer: Medicare Other | Admitting: *Deleted

## 2018-09-11 DIAGNOSIS — I472 Ventricular tachycardia, unspecified: Secondary | ICD-10-CM

## 2018-09-11 DIAGNOSIS — I5022 Chronic systolic (congestive) heart failure: Secondary | ICD-10-CM

## 2018-09-12 LAB — CUP PACEART REMOTE DEVICE CHECK
Battery Remaining Longevity: 14 mo
Battery Voltage: 2.88 V
Brady Statistic AP VP Percent: 0.59 %
Brady Statistic AP VS Percent: 0 %
Brady Statistic AS VP Percent: 99.06 %
Brady Statistic AS VS Percent: 0.35 %
Brady Statistic RA Percent Paced: 0.59 %
Brady Statistic RV Percent Paced: 99.31 %
Date Time Interrogation Session: 20200909023725
HighPow Impedance: 45 Ohm
HighPow Impedance: 50 Ohm
Implantable Lead Implant Date: 20090619
Implantable Lead Implant Date: 20090619
Implantable Lead Location: 753859
Implantable Lead Location: 753860
Implantable Lead Model: 5076
Implantable Lead Model: 7120
Implantable Pulse Generator Implant Date: 20150408
Lead Channel Impedance Value: 304 Ohm
Lead Channel Impedance Value: 399 Ohm
Lead Channel Impedance Value: 4047 Ohm
Lead Channel Impedance Value: 4047 Ohm
Lead Channel Impedance Value: 4047 Ohm
Lead Channel Impedance Value: 4047 Ohm
Lead Channel Impedance Value: 4047 Ohm
Lead Channel Impedance Value: 4047 Ohm
Lead Channel Impedance Value: 4047 Ohm
Lead Channel Impedance Value: 4047 Ohm
Lead Channel Impedance Value: 4047 Ohm
Lead Channel Impedance Value: 4047 Ohm
Lead Channel Impedance Value: 437 Ohm
Lead Channel Pacing Threshold Amplitude: 1.125 V
Lead Channel Pacing Threshold Amplitude: 2.5 V
Lead Channel Pacing Threshold Pulse Width: 0.4 ms
Lead Channel Pacing Threshold Pulse Width: 0.4 ms
Lead Channel Sensing Intrinsic Amplitude: 1.125 mV
Lead Channel Sensing Intrinsic Amplitude: 1.125 mV
Lead Channel Sensing Intrinsic Amplitude: 15.25 mV
Lead Channel Sensing Intrinsic Amplitude: 15.25 mV
Lead Channel Setting Pacing Amplitude: 2.5 V
Lead Channel Setting Pacing Amplitude: 3 V
Lead Channel Setting Pacing Pulse Width: 0.4 ms
Lead Channel Setting Sensing Sensitivity: 0.3 mV

## 2018-09-26 ENCOUNTER — Encounter: Payer: Self-pay | Admitting: Cardiology

## 2018-09-26 NOTE — Progress Notes (Signed)
Remote ICD transmission.   

## 2018-12-11 ENCOUNTER — Ambulatory Visit (INDEPENDENT_AMBULATORY_CARE_PROVIDER_SITE_OTHER): Payer: Medicare Other | Admitting: *Deleted

## 2018-12-11 DIAGNOSIS — Z95810 Presence of automatic (implantable) cardiac defibrillator: Secondary | ICD-10-CM

## 2018-12-12 LAB — CUP PACEART REMOTE DEVICE CHECK
Battery Remaining Longevity: 11 mo
Battery Voltage: 2.87 V
Brady Statistic AP VP Percent: 0.74 %
Brady Statistic AP VS Percent: 0 %
Brady Statistic AS VP Percent: 95.16 %
Brady Statistic AS VS Percent: 4.09 %
Brady Statistic RA Percent Paced: 0.74 %
Brady Statistic RV Percent Paced: 93.18 %
Date Time Interrogation Session: 20201208133827
HighPow Impedance: 39 Ohm
HighPow Impedance: 43 Ohm
Implantable Lead Implant Date: 20090619
Implantable Lead Implant Date: 20090619
Implantable Lead Location: 753859
Implantable Lead Location: 753860
Implantable Lead Model: 5076
Implantable Lead Model: 7120
Implantable Pulse Generator Implant Date: 20150408
Lead Channel Impedance Value: 266 Ohm
Lead Channel Impedance Value: 380 Ohm
Lead Channel Impedance Value: 399 Ohm
Lead Channel Impedance Value: 4047 Ohm
Lead Channel Impedance Value: 4047 Ohm
Lead Channel Impedance Value: 4047 Ohm
Lead Channel Impedance Value: 4047 Ohm
Lead Channel Impedance Value: 4047 Ohm
Lead Channel Impedance Value: 4047 Ohm
Lead Channel Impedance Value: 4047 Ohm
Lead Channel Impedance Value: 4047 Ohm
Lead Channel Impedance Value: 4047 Ohm
Lead Channel Impedance Value: 4047 Ohm
Lead Channel Pacing Threshold Amplitude: 1 V
Lead Channel Pacing Threshold Amplitude: 2.5 V
Lead Channel Pacing Threshold Pulse Width: 0.4 ms
Lead Channel Pacing Threshold Pulse Width: 0.4 ms
Lead Channel Sensing Intrinsic Amplitude: 1.625 mV
Lead Channel Sensing Intrinsic Amplitude: 1.625 mV
Lead Channel Sensing Intrinsic Amplitude: 9.375 mV
Lead Channel Sensing Intrinsic Amplitude: 9.375 mV
Lead Channel Setting Pacing Amplitude: 2.5 V
Lead Channel Setting Pacing Amplitude: 3 V
Lead Channel Setting Pacing Pulse Width: 0.4 ms
Lead Channel Setting Sensing Sensitivity: 0.3 mV

## 2019-02-19 DIAGNOSIS — H5213 Myopia, bilateral: Secondary | ICD-10-CM | POA: Diagnosis not present

## 2019-02-25 DIAGNOSIS — I472 Ventricular tachycardia: Secondary | ICD-10-CM | POA: Diagnosis not present

## 2019-02-25 DIAGNOSIS — I5020 Unspecified systolic (congestive) heart failure: Secondary | ICD-10-CM | POA: Diagnosis not present

## 2019-02-25 DIAGNOSIS — I255 Ischemic cardiomyopathy: Secondary | ICD-10-CM | POA: Diagnosis not present

## 2019-02-25 DIAGNOSIS — I25118 Atherosclerotic heart disease of native coronary artery with other forms of angina pectoris: Secondary | ICD-10-CM | POA: Diagnosis not present

## 2019-02-25 DIAGNOSIS — I10 Essential (primary) hypertension: Secondary | ICD-10-CM | POA: Diagnosis not present

## 2019-03-12 ENCOUNTER — Ambulatory Visit (INDEPENDENT_AMBULATORY_CARE_PROVIDER_SITE_OTHER): Payer: Medicare Other | Admitting: *Deleted

## 2019-03-12 DIAGNOSIS — Z95810 Presence of automatic (implantable) cardiac defibrillator: Secondary | ICD-10-CM

## 2019-03-12 LAB — CUP PACEART REMOTE DEVICE CHECK
Battery Remaining Longevity: 8 mo
Battery Voltage: 2.86 V
Brady Statistic AP VP Percent: 1.47 %
Brady Statistic AP VS Percent: 0.01 %
Brady Statistic AS VP Percent: 96.49 %
Brady Statistic AS VS Percent: 2.03 %
Brady Statistic RA Percent Paced: 1.47 %
Brady Statistic RV Percent Paced: 96.07 %
Date Time Interrogation Session: 20210309022707
HighPow Impedance: 44 Ohm
HighPow Impedance: 52 Ohm
Implantable Lead Implant Date: 20090619
Implantable Lead Implant Date: 20090619
Implantable Lead Location: 753859
Implantable Lead Location: 753860
Implantable Lead Model: 5076
Implantable Lead Model: 7120
Implantable Pulse Generator Implant Date: 20150408
Lead Channel Impedance Value: 266 Ohm
Lead Channel Impedance Value: 399 Ohm
Lead Channel Impedance Value: 4047 Ohm
Lead Channel Impedance Value: 4047 Ohm
Lead Channel Impedance Value: 4047 Ohm
Lead Channel Impedance Value: 4047 Ohm
Lead Channel Impedance Value: 4047 Ohm
Lead Channel Impedance Value: 4047 Ohm
Lead Channel Impedance Value: 4047 Ohm
Lead Channel Impedance Value: 4047 Ohm
Lead Channel Impedance Value: 4047 Ohm
Lead Channel Impedance Value: 4047 Ohm
Lead Channel Impedance Value: 437 Ohm
Lead Channel Pacing Threshold Amplitude: 1 V
Lead Channel Pacing Threshold Amplitude: 2.5 V
Lead Channel Pacing Threshold Pulse Width: 0.4 ms
Lead Channel Pacing Threshold Pulse Width: 0.4 ms
Lead Channel Sensing Intrinsic Amplitude: 1.5 mV
Lead Channel Sensing Intrinsic Amplitude: 1.5 mV
Lead Channel Sensing Intrinsic Amplitude: 24.75 mV
Lead Channel Sensing Intrinsic Amplitude: 24.75 mV
Lead Channel Setting Pacing Amplitude: 2.5 V
Lead Channel Setting Pacing Amplitude: 3 V
Lead Channel Setting Pacing Pulse Width: 0.4 ms
Lead Channel Setting Sensing Sensitivity: 0.3 mV

## 2019-03-13 NOTE — Progress Notes (Signed)
ICD Remote  

## 2019-05-27 DIAGNOSIS — I10 Essential (primary) hypertension: Secondary | ICD-10-CM | POA: Diagnosis not present

## 2019-05-27 DIAGNOSIS — I5020 Unspecified systolic (congestive) heart failure: Secondary | ICD-10-CM | POA: Diagnosis not present

## 2019-05-27 DIAGNOSIS — I25118 Atherosclerotic heart disease of native coronary artery with other forms of angina pectoris: Secondary | ICD-10-CM | POA: Diagnosis not present

## 2019-05-27 DIAGNOSIS — I472 Ventricular tachycardia: Secondary | ICD-10-CM | POA: Diagnosis not present

## 2019-05-27 DIAGNOSIS — I255 Ischemic cardiomyopathy: Secondary | ICD-10-CM | POA: Diagnosis not present

## 2019-06-11 ENCOUNTER — Ambulatory Visit (INDEPENDENT_AMBULATORY_CARE_PROVIDER_SITE_OTHER): Payer: Medicare Other | Admitting: *Deleted

## 2019-06-11 DIAGNOSIS — I472 Ventricular tachycardia: Secondary | ICD-10-CM | POA: Diagnosis not present

## 2019-06-11 DIAGNOSIS — I4729 Other ventricular tachycardia: Secondary | ICD-10-CM

## 2019-06-11 LAB — CUP PACEART REMOTE DEVICE CHECK
Battery Remaining Longevity: 7 mo
Battery Voltage: 2.83 V
Brady Statistic AP VP Percent: 0.72 %
Brady Statistic AP VS Percent: 0 %
Brady Statistic AS VP Percent: 97.01 %
Brady Statistic AS VS Percent: 2.27 %
Brady Statistic RA Percent Paced: 0.72 %
Brady Statistic RV Percent Paced: 96.17 %
Date Time Interrogation Session: 20210608112603
HighPow Impedance: 40 Ohm
HighPow Impedance: 45 Ohm
Implantable Lead Implant Date: 20090619
Implantable Lead Implant Date: 20090619
Implantable Lead Location: 753859
Implantable Lead Location: 753860
Implantable Lead Model: 5076
Implantable Lead Model: 7120
Implantable Pulse Generator Implant Date: 20150408
Lead Channel Impedance Value: 266 Ohm
Lead Channel Impedance Value: 380 Ohm
Lead Channel Impedance Value: 399 Ohm
Lead Channel Impedance Value: 4047 Ohm
Lead Channel Impedance Value: 4047 Ohm
Lead Channel Impedance Value: 4047 Ohm
Lead Channel Impedance Value: 4047 Ohm
Lead Channel Impedance Value: 4047 Ohm
Lead Channel Impedance Value: 4047 Ohm
Lead Channel Impedance Value: 4047 Ohm
Lead Channel Impedance Value: 4047 Ohm
Lead Channel Impedance Value: 4047 Ohm
Lead Channel Impedance Value: 4047 Ohm
Lead Channel Pacing Threshold Amplitude: 1.125 V
Lead Channel Pacing Threshold Amplitude: 2.5 V
Lead Channel Pacing Threshold Pulse Width: 0.4 ms
Lead Channel Pacing Threshold Pulse Width: 0.4 ms
Lead Channel Sensing Intrinsic Amplitude: 1.125 mV
Lead Channel Sensing Intrinsic Amplitude: 1.125 mV
Lead Channel Sensing Intrinsic Amplitude: 14.25 mV
Lead Channel Sensing Intrinsic Amplitude: 14.25 mV
Lead Channel Setting Pacing Amplitude: 2.5 V
Lead Channel Setting Pacing Amplitude: 3 V
Lead Channel Setting Pacing Pulse Width: 0.4 ms
Lead Channel Setting Sensing Sensitivity: 0.3 mV

## 2019-06-12 NOTE — Progress Notes (Signed)
Remote ICD transmission.   

## 2019-07-02 NOTE — Progress Notes (Signed)
Electrophysiology Office Note Date: 07/03/2019  ID:  [name], DOB 07/14/1950, MRN [id number]  PCP: Charolette Forward, MD Primary Cardiologist: No primary care provider on file. Electrophysiologist: Virl Axe, MD   CC: Routine ICD follow-up  [name] is a 69 y.o. male seen today for Virl Axe, MD for routine electrophysiology followup.  Since last being seen in our clinic the patient reports doing well overall. He has mild SOB with exertion. Does his ADLs without difficulty, but in the heat like today is SOB walking from his car to the inside of a building. Denies chest pain. No ICD shocks or palpitations. No PND, orthopnea, nausea, vomiting, dizziness, syncope, edema, weight gain, or early satiety. He has not had ICD shocks.   Device History: Medtronic Dual Chamber ICD implanted 2009 for ICM, attempted CRTD upgrade 10/2013 with significant stenosis and inability to deploy LV lead History of appropriate therapy: Yes History of AAD therapy: Yes; currently on sotalol   Past Medical History:  Diagnosis Date  . Automatic implantable cardioverter-defibrillator in situ 01/18/2011  . AV BLOCK, COMPLETE 12/05/2007   Qualifier: Diagnosis of  By: Caryl Comes, MD, Leonidas Romberg Mack Guise   . CAD (coronary artery disease)    a. s/p PCI/BMS of prox LAD in 1997;  b. 4/10 LHC: nonobs dzs. (pRCA 50-70%);  c.   Myoview 08/23/11 : Scar with peri-infarct ischemia infecting the entire septum with a corresponding wall motion abnormality, moderate risk scan, EF 42%.  . Cardiac defibrillator in place 04/22/2013  . CHF (congestive heart failure) ([place]) 04/22/2013  . Chronic back pain    a. s/p multiple lumbar surgeries  . Chronic systolic CHF (congestive heart failure) ([place]) 05/08/2008   Qualifier: Diagnosis of  By: Caryl Comes, MD, Leonidas Romberg Mack Guise   . Complete heart block ([place])    a. h/o transient complete heart block in April 2009  . CORONARY ATHEROSCLEROSIS NATIVE CORONARY ARTERY 12/05/2007    Qualifier: Diagnosis of  By: Caryl Comes, MD, Leonidas Romberg Mack Guise   . DJD (degenerative joint disease)   . GERD (gastroesophageal reflux disease)   . Hyperlipidemia   . Ischemic cardiomyopathy    a. 05/2009 Echo: EF 40%, mild to mod glob HK, Gr 2 dd, mild LVH, Triv AI, Mild MR, PASP 37-64mmHg  . Midsternal chest pain 08/17/2011  . Monomorphic ventricular tachycardia ([place])    a. s/p ICD 1997;   b. 06/2007 ICD upgrade to MDT Concerto Bi-V though LV lead unable to be placed.  . Myasthenia gravis   . Myasthenia gravis associated with thymoma ([place]) 04/22/2013  . MYASTHENIA GRAVIS WITHOUT EXACERBATION 12/05/2007   Qualifier: Diagnosis of  By: Caryl Comes, MD, Leonidas Romberg Mack Guise   . Sciatica   . VENTRICULAR TACHYCARDIA 12/05/2007   Qualifier: Diagnosis of  By: Caryl Comes, MD, [facility], Mack Guise    Past Surgical History:  Procedure Laterality Date  . anterior lumbar interbody fusion  04/23/2007   L4-L5   . BI-VENTRICULAR IMPLANTABLE CARDIOVERTER DEFIBRILLATOR UPGRADE N/A 04/10/2013   failed CRTD upgrade  . BI-VENTRICULAR PACEMAKER UPGRADE N/A 10/14/2013   failed CRTD upgrade  . CARDIAC DEFIBRILLATOR PLACEMENT  2001; 2008   single chamber MDT ICD implanted for secondary prevention; gen change 2008; explantation 2009  . CORONARY ANGIOPLASTY WITH STENT PLACEMENT     bare metal stenting of the LAD in 1997  . GALLBLADDER SURGERY    . IMPLANTABLE CARDIOVERTER DEFIBRILLATOR IMPLANT  2009; 2015   MDT dual chamber right sided device implanted 2009 with gen change 2015;  unable to place LV lead    Current Outpatient Medications  Medication Sig Dispense Refill  . aspirin EC 81 MG tablet Take 1 tablet (81 mg total) by mouth daily.    Marland Kitchen atorvastatin (LIPITOR) 40 MG tablet Take 40 mg by mouth daily.    Marland Kitchen azaTHIOprine (IMURAN) 50 MG tablet Take 150 mg by mouth daily.     . carvedilol (COREG) 3.125 MG tablet Take 1 tablet (3.125 mg total) by mouth 2 (two) times daily with a meal. Please make overdue appt with Dr. Caryl Comes.  2nd attempt 30 tablet 0  . furosemide (LASIX) 40 MG tablet Take 1.5 tablets (60 mg total) by mouth daily. 135 tablet 3  . HYDROcodone-acetaminophen (NORCO) 10-325 MG per tablet Take 1 tablet by mouth every 6 (six) hours as needed for severe pain.    Marland Kitchen ipratropium (ATROVENT) 0.03 % nasal spray Place 2 sprays into both nostrils 3 (three) times daily as needed for rhinitis.    Marland Kitchen lisinopril (PRINIVIL,ZESTRIL) 5 MG tablet Take 1 tablet (5 mg total) by mouth daily. 90 tablet 3  . Nutritional Supplements (GRAPESEED EXTRACT PO) Take 2 capsules by mouth daily.    Marland Kitchen omeprazole (PRILOSEC) 40 MG capsule Take 40 mg by mouth daily.    Marland Kitchen OVER THE COUNTER MEDICATION Take 1 tablet by mouth daily. Blue green algae    . OVER THE COUNTER MEDICATION Take 2 tablets by mouth daily. Digestive enzymes    . Polyvinyl Alcohol-Povidone (REFRESH OP) Place 1 drop into both eyes daily as needed (red eyes).    . potassium chloride SA (K-DUR,KLOR-CON) 20 MEQ tablet TAKE 1 TABLET BY MOUTH  DAILY ( MUST KEEP UPCOMING APPOINTMENT FOR FURTHER REFILLS) 90 tablet 3  . predniSONE (DELTASONE) 5 MG tablet Take 5 mg by mouth every other day.      . sacubitril-valsartan (ENTRESTO) 49-51 MG Take 1 tablet by mouth 2 (two) times daily.    . simvastatin (ZOCOR) 40 MG tablet TAKE ONE TABLET BY MOUTH ONCE DAILY 90 tablet 1  . sotalol (BETAPACE) 120 MG tablet Take 1 tablet by mouth two times daily. Patient is overdue for an appointment and needs to call and schedule for further refills 2nd attempt 30 tablet 0   No current facility-administered medications for this visit.    Allergies:   Procainamide and Quinidine   Social History: Social History   Socioeconomic History  . Marital status: Single    Spouse name: Not on file  . Number of children: Not on file  . Years of education: Not on file  . Highest education level: Not on file  Occupational History  . Not on file  Tobacco Use  . Smoking status: Former Smoker    Packs/day: 0.30     Years: 6.00    Pack years: 1.80    Types: Cigarettes    Quit date: 01/03/1982    Years since quitting: 37.5  . Smokeless tobacco: Never Used  Substance and Sexual Activity  . Alcohol use: Yes    Comment: occasional  . Drug use: No  . Sexual activity: Not on file  Other Topics Concern  . Not on file  Social History Narrative  . Not on file   Social Determinants of Health   Financial Resource Strain:   . Difficulty of Paying Living Expenses:   Food Insecurity:   . Worried About Charity fundraiser in the Last Year:   . [place] in the Last Year:  Transportation Needs:   . Film/video editor (Medical):   Marland Kitchen Lack of Transportation (Non-Medical):   Physical Activity:   . Days of Exercise per Week:   . Minutes of Exercise per Session:   Stress:   . Feeling of Stress :   Social Connections:   . Frequency of Communication with Friends and Family:   . Frequency of Social Gatherings with Friends and Family:   . Attends Religious Services:   . Active Member of Clubs or Organizations:   . Attends Archivist Meetings:   Marland Kitchen Marital Status:   Intimate Partner Violence:   . Fear of Current or Ex-Partner:   . Emotionally Abused:   Marland Kitchen Physically Abused:   . Sexually Abused:     Family History: Family History  Problem Relation Age of Onset  . Heart disease Mother   . Heart disease Father   . Coronary artery disease Brother   . Hypertension Brother   . Diabetes Neg Hx   . Stroke Neg Hx     Review of Systems: All other systems reviewed and are otherwise negative except as noted above.   Physical Exam: Vitals:   07/03/19 0817  BP: 100/62  Pulse: 80  SpO2: 93%  Weight: 187 lb 9.6 oz (85.1 kg)  Height: 5\' 8"  (1.727 m)     GEN- The patient is well appearing, alert and oriented x 3 today.   HEENT: normocephalic, atraumatic; sclera clear, conjunctiva pink; hearing intact; oropharynx clear; neck supple, no JVP Lymph- no cervical lymphadenopathy Lungs-  Clear to ausculation bilaterally, normal work of breathing.  No wheezes, rales, rhonchi Heart- Regular rate and rhythm, no murmurs, rubs or gallops, PMI not laterally displaced GI- soft, non-tender, non-distended, bowel sounds present, no hepatosplenomegaly Extremities- no clubbing, cyanosis, or edema; DP/PT/radial pulses 2+ bilaterally MS- no significant deformity or atrophy Skin- warm and dry, no rash or lesion; ICD pocket well healed Psych- euthymic mood, full affect Neuro- strength and sensation are intact  ICD interrogation- reviewed in detail today,  See PACEART report  EKG:  EKG is ordered today. The ekg ordered today shows A-sensed V paced at 80 bpm, QRS 198 ms, PR interval 134 ms  Recent Labs: No results found for requested labs within last 8760 hours.   Wt Readings from Last 3 Encounters:  07/03/19 187 lb 9.6 oz (85.1 kg)  10/18/17 184 lb (83.5 kg)  03/11/16 189 lb 12.8 oz (86.1 kg)     Other studies Reviewed: Additional studies/ records that were reviewed today include: Previous EP office notes, Previous echo, recent remotes   Assessment and Plan:  1.  Chronic systolic dysfunction s/p Medtronic dual chamber ICD  euvolemic today NYHA III symptoms Stable on an appropriate medical regimen Normal ICD function See Pace Art report RV lead amplitude changed to 2.5V @ 0.26ms (up from 0.13ms), with slightly increased threshold and to keep 2x safety margin (Threshold intermittent at 1.5V at 0.62ms, 1.25V @ 0.38ms  Update Echo Pt failed attempt at CRT placement due to stenosis and failure to deploy LV lead. If EF is down, he would therefore be a barostim candidate with inability to place LV lead. He is on coreg and Entresto. Could also consider spironolactone based on labwork.   2. CAD/ICM Denies ischemic symptoms Continue medical therapy  3. VT No recent device therapy Continue sotalol BMET and Mg today  4. High risk medication surveillance, sotalol Labs and EKG today. 6  month f/u.    Current medicines are  reviewed at length with the patient today.   The patient does not have concerns regarding his medicines.  The following changes were made today:  none  Labs/ tests ordered today include:  Orders Placed This Encounter  Procedures  . Pro b natriuretic peptide  . Basic Metabolic Panel (BMET)  . Magnesium  . EKG 12-Lead  . ECHOCARDIOGRAM COMPLETE    Disposition:   Follow up with Dr. Caryl Comes in 6 months to discuss gen change.   Jacalyn Lefevre, PA-C  07/03/2019 8:56 AM  [facility] [address] [place] [address] [phone] (office) [phone] (fax)

## 2019-07-03 ENCOUNTER — Other Ambulatory Visit: Payer: Self-pay

## 2019-07-03 ENCOUNTER — Ambulatory Visit (INDEPENDENT_AMBULATORY_CARE_PROVIDER_SITE_OTHER): Payer: Medicare Other | Admitting: Student

## 2019-07-03 ENCOUNTER — Encounter: Payer: Self-pay | Admitting: Student

## 2019-07-03 VITALS — BP 100/62 | HR 80 | Ht 68.0 in | Wt 187.6 lb

## 2019-07-03 DIAGNOSIS — Z95810 Presence of automatic (implantable) cardiac defibrillator: Secondary | ICD-10-CM

## 2019-07-03 DIAGNOSIS — I472 Ventricular tachycardia, unspecified: Secondary | ICD-10-CM

## 2019-07-03 DIAGNOSIS — I5022 Chronic systolic (congestive) heart failure: Secondary | ICD-10-CM | POA: Diagnosis not present

## 2019-07-03 DIAGNOSIS — Z79899 Other long term (current) drug therapy: Secondary | ICD-10-CM

## 2019-07-03 DIAGNOSIS — I255 Ischemic cardiomyopathy: Secondary | ICD-10-CM | POA: Diagnosis not present

## 2019-07-03 LAB — CUP PACEART INCLINIC DEVICE CHECK
Battery Remaining Longevity: 6 mo
Battery Voltage: 2.83 V
Brady Statistic AP VP Percent: 0.88 %
Brady Statistic AP VS Percent: 0 %
Brady Statistic AS VP Percent: 97.62 %
Brady Statistic AS VS Percent: 1.5 %
Brady Statistic RA Percent Paced: 0.88 %
Brady Statistic RV Percent Paced: 97.36 %
Date Time Interrogation Session: 20210630091231
HighPow Impedance: 43 Ohm
HighPow Impedance: 52 Ohm
Implantable Lead Implant Date: 20090619
Implantable Lead Implant Date: 20090619
Implantable Lead Location: 753859
Implantable Lead Location: 753860
Implantable Lead Model: 5076
Implantable Lead Model: 7120
Implantable Pulse Generator Implant Date: 20150408
Lead Channel Impedance Value: 266 Ohm
Lead Channel Impedance Value: 399 Ohm
Lead Channel Impedance Value: 399 Ohm
Lead Channel Impedance Value: 4047 Ohm
Lead Channel Impedance Value: 4047 Ohm
Lead Channel Impedance Value: 4047 Ohm
Lead Channel Impedance Value: 4047 Ohm
Lead Channel Impedance Value: 4047 Ohm
Lead Channel Impedance Value: 4047 Ohm
Lead Channel Impedance Value: 4047 Ohm
Lead Channel Impedance Value: 4047 Ohm
Lead Channel Impedance Value: 4047 Ohm
Lead Channel Impedance Value: 4047 Ohm
Lead Channel Pacing Threshold Amplitude: 1.25 V
Lead Channel Pacing Threshold Amplitude: 2.5 V
Lead Channel Pacing Threshold Pulse Width: 0.4 ms
Lead Channel Pacing Threshold Pulse Width: 0.4 ms
Lead Channel Sensing Intrinsic Amplitude: 1 mV
Lead Channel Sensing Intrinsic Amplitude: 1.375 mV
Lead Channel Sensing Intrinsic Amplitude: 17.125 mV
Lead Channel Sensing Intrinsic Amplitude: 22 mV
Lead Channel Setting Pacing Amplitude: 2.5 V
Lead Channel Setting Pacing Amplitude: 3 V
Lead Channel Setting Pacing Pulse Width: 0.6 ms
Lead Channel Setting Sensing Sensitivity: 0.3 mV

## 2019-07-03 NOTE — Patient Instructions (Signed)
Medication Instructions:  *If you need a refill on your cardiac medications before your next appointment, please call your pharmacy*  Lab Work: Your physician has recommended that you have lab work today: BMET, Magnesium Level, and Pro BNP  If you have labs (blood work) drawn today and your tests are completely normal, you will receive your results only by: Marland Kitchen MyChart Message (if you have MyChart) OR . A paper copy in the mail If you have any lab test that is abnormal or we need to change your treatment, we will call you to review the results.  Testing/Procedures: Your physician has requested that you have an echocardiogram. Echocardiography is a painless test that uses sound waves to create images of your heart. It provides your doctor with information about the size and shape of your heart and how well your heart's chambers and valves are working. This procedure takes approximately one hour. There are no restrictions for this procedure.  Follow-Up: At [facility], you and your health needs are our priority.  As part of our continuing mission to provide you with exceptional heart care, we have created designated Provider Care Teams.  These Care Teams include your primary Cardiologist (physician) and Advanced Practice Providers (APPs -  Physician Assistants and Nurse Practitioners) who all work together to provide you with the care you need, when you need it.  We recommend signing up for the patient portal called "MyChart".  Sign up information is provided on this After Visit Summary.  MyChart is used to connect with patients for Virtual Visits (Telemedicine).  Patients are able to view lab/test results, encounter notes, upcoming appointments, etc.  Non-urgent messages can be sent to your provider as well.   To learn more about what you can do with MyChart, go to [email or web address].    Your next appointment:   Your physician wants you to follow-up in: 6 MONTHS with Dr. Caryl Comes. You will  receive a reminder letter in the mail two months in advance. If you don't receive a letter, please call our office to schedule the follow-up appointment.  Remote monitoring is used to monitor your ICD from home. This monitoring reduces the number of office visits required to check your device to one time per year. It allows [place] to keep an eye on the functioning of your device to ensure it is working properly. You are scheduled for a device check from home on 08/02/19. You may send your transmission at any time that day. If you have a wireless device, the transmission will be sent automatically. After your physician reviews your transmission, you will receive a postcard with your next transmission date.    The format for your next appointment:   In Person with Dr. Caryl Comes  Other Instructions: We will be checking your ICD remotely once a month being that your battery life is reaching End of Life.

## 2019-07-04 LAB — BASIC METABOLIC PANEL
BUN/Creatinine Ratio: 14 (ref 10–24)
BUN: 14 mg/dL (ref 8–27)
CO2: 25 mmol/L (ref 20–29)
Calcium: 9.3 mg/dL (ref 8.6–10.2)
Chloride: 99 mmol/L (ref 96–106)
Creatinine, Ser: 1.01 mg/dL (ref 0.76–1.27)
GFR calc Af Amer: 86 mL/min/{1.73_m2} (ref >59)
GFR calc non Af Amer: 74 mL/min/{1.73_m2} (ref >59)
Glucose: 115 mg/dL — ABNORMAL HIGH (ref 65–99)
Potassium: 4.3 mmol/L (ref 3.5–5.2)
Sodium: 138 mmol/L (ref 134–144)

## 2019-07-04 LAB — PRO B NATRIURETIC PEPTIDE: NT-Pro BNP: 142 pg/mL (ref 0–376)

## 2019-07-04 LAB — MAGNESIUM: Magnesium: 1.9 mg/dL (ref 1.6–2.3)

## 2019-07-19 DIAGNOSIS — I5020 Unspecified systolic (congestive) heart failure: Secondary | ICD-10-CM | POA: Diagnosis not present

## 2019-07-24 ENCOUNTER — Telehealth: Payer: Self-pay | Admitting: Student

## 2019-07-24 NOTE — Telephone Encounter (Signed)
Thank you! Can we please have it scanned in, I'm not in the office this week but can come over if needed.   Legrand Como [address]" [place], PA-C  07/24/2019 4:07 PM

## 2019-07-24 NOTE — Telephone Encounter (Signed)
[name], found the echo report and gave it to [place] in Medical Records, who states she will scan this into his chart today.

## 2019-07-24 NOTE — Telephone Encounter (Signed)
New message    Patient has an echo scheduled for 07-25-19 ordered by Oda Kilts.  He brought an echo report in today ordered by Dr Terrence Dupont.  Echo was performed on 07-19-19.  Patient has cancelled his echo ordered by Jonni Sanger and request he look at the report from Dr [facility] office and let him know if he still need to have another echo.  He is afraid that his insurance will not pay for two echo's.  Patient has cancelled his 07-25-19 echo.  Echo put in Andy's box in chart prep.

## 2019-07-24 NOTE — Telephone Encounter (Signed)
Pt brought in his echo result that was ordered and advised on by Dr. Terrence Dupont.  Pt had this done on 07/19/19.  Pt states he gave a copy of this result to [name] at [facility], who was going to place this in [name]'s mailbox for him to further review and advise on.  Pt states [name] Kilts PA-C ordered for him to have an echo done here on 7/22, but the pt cancelled this, for he just had this done on 7/16 by Dr. Terrence Dupont. Pt would like for [name] to review this and advise, and scan this into his chart for our records. Informed the pt that I will route this message to both [name] and his CMA for their review, and to be on the lookout for his echo report. Informed the pt they will follow-up with him accordingly thereafter.  Pt verbalized understanding and agrees with this plan. Pts echo for 7/22 at our office, was cancelled by [facility] scheduling.

## 2019-07-25 ENCOUNTER — Telehealth: Payer: Self-pay | Admitting: Cardiology

## 2019-07-25 ENCOUNTER — Other Ambulatory Visit (HOSPITAL_COMMUNITY): Payer: Medicare Other

## 2019-07-25 NOTE — Telephone Encounter (Signed)
Pt is calling to get his ecko refults.

## 2019-07-25 NOTE — Telephone Encounter (Signed)
Left message for patient to call me back for results and plan of action

## 2019-07-25 NOTE — Telephone Encounter (Signed)
Thank so much! I left myself a reminder to take a look at it

## 2019-07-30 NOTE — Telephone Encounter (Signed)
Pt is aware and agreeable to echo results. Pt was very disappointed that he was not an eligible candidate for the Barostim device at this time. He is agreeable to keeping his December follow up with Dr. Caryl Comes.

## 2019-08-02 ENCOUNTER — Ambulatory Visit (INDEPENDENT_AMBULATORY_CARE_PROVIDER_SITE_OTHER): Payer: Medicare Other | Admitting: *Deleted

## 2019-08-02 DIAGNOSIS — I442 Atrioventricular block, complete: Secondary | ICD-10-CM

## 2019-08-02 LAB — CUP PACEART REMOTE DEVICE CHECK
Battery Remaining Longevity: 6 mo
Battery Voltage: 2.83 V
Brady Statistic AP VP Percent: 0.28 %
Brady Statistic AP VS Percent: 0 %
Brady Statistic AS VP Percent: 99.37 %
Brady Statistic AS VS Percent: 0.35 %
Brady Statistic RA Percent Paced: 0.28 %
Brady Statistic RV Percent Paced: 99.43 %
Date Time Interrogation Session: 20210730121604
HighPow Impedance: 42 Ohm
HighPow Impedance: 48 Ohm
Implantable Lead Implant Date: 20090619
Implantable Lead Implant Date: 20090619
Implantable Lead Location: 753859
Implantable Lead Location: 753860
Implantable Lead Model: 5076
Implantable Lead Model: 7120
Implantable Pulse Generator Implant Date: 20150408
Lead Channel Impedance Value: 266 Ohm
Lead Channel Impedance Value: 380 Ohm
Lead Channel Impedance Value: 399 Ohm
Lead Channel Impedance Value: 4047 Ohm
Lead Channel Impedance Value: 4047 Ohm
Lead Channel Impedance Value: 4047 Ohm
Lead Channel Impedance Value: 4047 Ohm
Lead Channel Impedance Value: 4047 Ohm
Lead Channel Impedance Value: 4047 Ohm
Lead Channel Impedance Value: 4047 Ohm
Lead Channel Impedance Value: 4047 Ohm
Lead Channel Impedance Value: 4047 Ohm
Lead Channel Impedance Value: 4047 Ohm
Lead Channel Pacing Threshold Amplitude: 1.125 V
Lead Channel Pacing Threshold Amplitude: 2.5 V
Lead Channel Pacing Threshold Pulse Width: 0.4 ms
Lead Channel Pacing Threshold Pulse Width: 0.4 ms
Lead Channel Sensing Intrinsic Amplitude: 1 mV
Lead Channel Sensing Intrinsic Amplitude: 1 mV
Lead Channel Sensing Intrinsic Amplitude: 17.125 mV
Lead Channel Sensing Intrinsic Amplitude: 22 mV
Lead Channel Setting Pacing Amplitude: 2.5 V
Lead Channel Setting Pacing Amplitude: 3 V
Lead Channel Setting Pacing Pulse Width: 0.4 ms
Lead Channel Setting Sensing Sensitivity: 0.3 mV

## 2019-08-06 NOTE — Progress Notes (Signed)
Remote ICD transmission.   

## 2019-09-02 DIAGNOSIS — I5020 Unspecified systolic (congestive) heart failure: Secondary | ICD-10-CM | POA: Diagnosis not present

## 2019-09-02 DIAGNOSIS — I472 Ventricular tachycardia: Secondary | ICD-10-CM | POA: Diagnosis not present

## 2019-09-02 DIAGNOSIS — I25118 Atherosclerotic heart disease of native coronary artery with other forms of angina pectoris: Secondary | ICD-10-CM | POA: Diagnosis not present

## 2019-09-02 DIAGNOSIS — I38 Endocarditis, valve unspecified: Secondary | ICD-10-CM | POA: Diagnosis not present

## 2019-09-02 DIAGNOSIS — I255 Ischemic cardiomyopathy: Secondary | ICD-10-CM | POA: Diagnosis not present

## 2019-09-10 ENCOUNTER — Ambulatory Visit (INDEPENDENT_AMBULATORY_CARE_PROVIDER_SITE_OTHER): Payer: Medicare Other | Admitting: *Deleted

## 2019-09-10 DIAGNOSIS — I472 Ventricular tachycardia: Secondary | ICD-10-CM | POA: Diagnosis not present

## 2019-09-10 DIAGNOSIS — I4729 Other ventricular tachycardia: Secondary | ICD-10-CM

## 2019-09-11 LAB — CUP PACEART REMOTE DEVICE CHECK
Battery Remaining Longevity: 4 mo
Battery Voltage: 2.81 V
Brady Statistic AP VP Percent: 0.48 %
Brady Statistic AP VS Percent: 0 %
Brady Statistic AS VP Percent: 98.9 %
Brady Statistic AS VS Percent: 0.62 %
Brady Statistic RA Percent Paced: 0.48 %
Brady Statistic RV Percent Paced: 99.11 %
Date Time Interrogation Session: 20210908014226
HighPow Impedance: 42 Ohm
HighPow Impedance: 50 Ohm
Implantable Lead Implant Date: 20090619
Implantable Lead Implant Date: 20090619
Implantable Lead Location: 753859
Implantable Lead Location: 753860
Implantable Lead Model: 5076
Implantable Lead Model: 7120
Implantable Pulse Generator Implant Date: 20150408
Lead Channel Impedance Value: 304 Ohm
Lead Channel Impedance Value: 380 Ohm
Lead Channel Impedance Value: 380 Ohm
Lead Channel Impedance Value: 4047 Ohm
Lead Channel Impedance Value: 4047 Ohm
Lead Channel Impedance Value: 4047 Ohm
Lead Channel Impedance Value: 4047 Ohm
Lead Channel Impedance Value: 4047 Ohm
Lead Channel Impedance Value: 4047 Ohm
Lead Channel Impedance Value: 4047 Ohm
Lead Channel Impedance Value: 4047 Ohm
Lead Channel Impedance Value: 4047 Ohm
Lead Channel Impedance Value: 4047 Ohm
Lead Channel Pacing Threshold Amplitude: 1 V
Lead Channel Pacing Threshold Amplitude: 2.5 V
Lead Channel Pacing Threshold Pulse Width: 0.4 ms
Lead Channel Pacing Threshold Pulse Width: 0.4 ms
Lead Channel Sensing Intrinsic Amplitude: 1.875 mV
Lead Channel Sensing Intrinsic Amplitude: 1.875 mV
Lead Channel Sensing Intrinsic Amplitude: 15.5 mV
Lead Channel Sensing Intrinsic Amplitude: 15.5 mV
Lead Channel Setting Pacing Amplitude: 2.5 V
Lead Channel Setting Pacing Amplitude: 3 V
Lead Channel Setting Pacing Pulse Width: 0.4 ms
Lead Channel Setting Sensing Sensitivity: 0.3 mV

## 2019-09-11 NOTE — Progress Notes (Signed)
Remote ICD transmission.   

## 2019-11-01 ENCOUNTER — Ambulatory Visit (INDEPENDENT_AMBULATORY_CARE_PROVIDER_SITE_OTHER): Payer: Medicare Other

## 2019-11-01 DIAGNOSIS — I472 Ventricular tachycardia: Secondary | ICD-10-CM

## 2019-11-01 DIAGNOSIS — I4729 Other ventricular tachycardia: Secondary | ICD-10-CM

## 2019-11-01 LAB — CUP PACEART REMOTE DEVICE CHECK
Battery Remaining Longevity: 3 mo
Battery Voltage: 2.8 V
Brady Statistic AP VP Percent: 0.4 %
Brady Statistic AP VS Percent: 0 %
Brady Statistic AS VP Percent: 98.83 %
Brady Statistic AS VS Percent: 0.77 %
Brady Statistic RA Percent Paced: 0.4 %
Brady Statistic RV Percent Paced: 98.97 %
Date Time Interrogation Session: 20211029163727
HighPow Impedance: 44 Ohm
HighPow Impedance: 52 Ohm
Implantable Lead Implant Date: 20090619
Implantable Lead Implant Date: 20090619
Implantable Lead Location: 753859
Implantable Lead Location: 753860
Implantable Lead Model: 5076
Implantable Lead Model: 7120
Implantable Pulse Generator Implant Date: 20150408
Lead Channel Impedance Value: 304 Ohm
Lead Channel Impedance Value: 380 Ohm
Lead Channel Impedance Value: 399 Ohm
Lead Channel Impedance Value: 4047 Ohm
Lead Channel Impedance Value: 4047 Ohm
Lead Channel Impedance Value: 4047 Ohm
Lead Channel Impedance Value: 4047 Ohm
Lead Channel Impedance Value: 4047 Ohm
Lead Channel Impedance Value: 4047 Ohm
Lead Channel Impedance Value: 4047 Ohm
Lead Channel Impedance Value: 4047 Ohm
Lead Channel Impedance Value: 4047 Ohm
Lead Channel Impedance Value: 4047 Ohm
Lead Channel Pacing Threshold Amplitude: 1.125 V
Lead Channel Pacing Threshold Amplitude: 2.5 V
Lead Channel Pacing Threshold Pulse Width: 0.4 ms
Lead Channel Pacing Threshold Pulse Width: 0.4 ms
Lead Channel Sensing Intrinsic Amplitude: 0.75 mV
Lead Channel Sensing Intrinsic Amplitude: 0.75 mV
Lead Channel Sensing Intrinsic Amplitude: 13.75 mV
Lead Channel Sensing Intrinsic Amplitude: 13.75 mV
Lead Channel Setting Pacing Amplitude: 2.5 V
Lead Channel Setting Pacing Amplitude: 3 V
Lead Channel Setting Pacing Pulse Width: 0.4 ms
Lead Channel Setting Sensing Sensitivity: 0.3 mV

## 2019-11-05 NOTE — Addendum Note (Signed)
Addended by: Cheri Kearns A on: 11/05/2019 03:09 PM   Modules accepted: Level of Service

## 2019-11-05 NOTE — Progress Notes (Signed)
Remote ICD transmission.   

## 2019-12-02 DIAGNOSIS — I255 Ischemic cardiomyopathy: Secondary | ICD-10-CM | POA: Diagnosis not present

## 2019-12-02 DIAGNOSIS — I472 Ventricular tachycardia: Secondary | ICD-10-CM | POA: Diagnosis not present

## 2019-12-02 DIAGNOSIS — I25118 Atherosclerotic heart disease of native coronary artery with other forms of angina pectoris: Secondary | ICD-10-CM | POA: Diagnosis not present

## 2019-12-02 DIAGNOSIS — I38 Endocarditis, valve unspecified: Secondary | ICD-10-CM | POA: Diagnosis not present

## 2019-12-02 DIAGNOSIS — I5020 Unspecified systolic (congestive) heart failure: Secondary | ICD-10-CM | POA: Diagnosis not present

## 2019-12-03 ENCOUNTER — Ambulatory Visit (INDEPENDENT_AMBULATORY_CARE_PROVIDER_SITE_OTHER): Payer: Medicare Other

## 2019-12-03 DIAGNOSIS — I4729 Other ventricular tachycardia: Secondary | ICD-10-CM

## 2019-12-03 DIAGNOSIS — I472 Ventricular tachycardia: Secondary | ICD-10-CM

## 2019-12-04 LAB — CUP PACEART REMOTE DEVICE CHECK
Battery Remaining Longevity: 2 mo
Battery Voltage: 2.77 V
Brady Statistic AP VP Percent: 0.59 %
Brady Statistic AP VS Percent: 0 %
Brady Statistic AS VP Percent: 98.73 %
Brady Statistic AS VS Percent: 0.68 %
Brady Statistic RA Percent Paced: 0.59 %
Brady Statistic RV Percent Paced: 99.06 %
Date Time Interrogation Session: 20211130193823
HighPow Impedance: 43 Ohm
HighPow Impedance: 51 Ohm
Implantable Lead Implant Date: 20090619
Implantable Lead Implant Date: 20090619
Implantable Lead Location: 753859
Implantable Lead Location: 753860
Implantable Lead Model: 5076
Implantable Lead Model: 7120
Implantable Pulse Generator Implant Date: 20150408
Lead Channel Impedance Value: 266 Ohm
Lead Channel Impedance Value: 399 Ohm
Lead Channel Impedance Value: 399 Ohm
Lead Channel Impedance Value: 4047 Ohm
Lead Channel Impedance Value: 4047 Ohm
Lead Channel Impedance Value: 4047 Ohm
Lead Channel Impedance Value: 4047 Ohm
Lead Channel Impedance Value: 4047 Ohm
Lead Channel Impedance Value: 4047 Ohm
Lead Channel Impedance Value: 4047 Ohm
Lead Channel Impedance Value: 4047 Ohm
Lead Channel Impedance Value: 4047 Ohm
Lead Channel Impedance Value: 4047 Ohm
Lead Channel Pacing Threshold Amplitude: 1.25 V
Lead Channel Pacing Threshold Amplitude: 2.5 V
Lead Channel Pacing Threshold Pulse Width: 0.4 ms
Lead Channel Pacing Threshold Pulse Width: 0.4 ms
Lead Channel Sensing Intrinsic Amplitude: 1.625 mV
Lead Channel Sensing Intrinsic Amplitude: 1.625 mV
Lead Channel Sensing Intrinsic Amplitude: 14.625 mV
Lead Channel Sensing Intrinsic Amplitude: 14.625 mV
Lead Channel Setting Pacing Amplitude: 2.5 V
Lead Channel Setting Pacing Amplitude: 3 V
Lead Channel Setting Pacing Pulse Width: 0.4 ms
Lead Channel Setting Sensing Sensitivity: 0.3 mV

## 2019-12-09 NOTE — Addendum Note (Signed)
Addended by: Cheri Kearns A on: 12/09/2019 02:44 PM   Modules accepted: Level of Service

## 2019-12-09 NOTE — Progress Notes (Signed)
Remote ICD transmission.   

## 2019-12-25 ENCOUNTER — Other Ambulatory Visit: Payer: Self-pay

## 2019-12-25 ENCOUNTER — Ambulatory Visit (INDEPENDENT_AMBULATORY_CARE_PROVIDER_SITE_OTHER): Payer: Medicare Other | Admitting: Internal Medicine

## 2019-12-25 ENCOUNTER — Encounter: Payer: Self-pay | Admitting: Internal Medicine

## 2019-12-25 VITALS — BP 132/86 | HR 90 | Ht 68.0 in | Wt 192.4 lb

## 2019-12-25 DIAGNOSIS — Z79899 Other long term (current) drug therapy: Secondary | ICD-10-CM

## 2019-12-25 DIAGNOSIS — I5022 Chronic systolic (congestive) heart failure: Secondary | ICD-10-CM | POA: Diagnosis not present

## 2019-12-25 DIAGNOSIS — R0602 Shortness of breath: Secondary | ICD-10-CM

## 2019-12-25 DIAGNOSIS — I255 Ischemic cardiomyopathy: Secondary | ICD-10-CM | POA: Diagnosis not present

## 2019-12-25 DIAGNOSIS — I442 Atrioventricular block, complete: Secondary | ICD-10-CM

## 2019-12-25 DIAGNOSIS — I4729 Other ventricular tachycardia: Secondary | ICD-10-CM

## 2019-12-25 DIAGNOSIS — I472 Ventricular tachycardia: Secondary | ICD-10-CM | POA: Diagnosis not present

## 2019-12-25 LAB — CUP PACEART INCLINIC DEVICE CHECK
Battery Remaining Longevity: 2 mo
Battery Voltage: 2.78 V
Brady Statistic AP VP Percent: 0.48 %
Brady Statistic AP VS Percent: 0 %
Brady Statistic AS VP Percent: 98.93 %
Brady Statistic AS VS Percent: 0.6 %
Brady Statistic RA Percent Paced: 0.48 %
Brady Statistic RV Percent Paced: 99.16 %
Date Time Interrogation Session: 20211222153000
HighPow Impedance: 42 Ohm
HighPow Impedance: 50 Ohm
Implantable Lead Implant Date: 20090619
Implantable Lead Implant Date: 20090619
Implantable Lead Location: 753859
Implantable Lead Location: 753860
Implantable Lead Model: 5076
Implantable Lead Model: 7120
Implantable Pulse Generator Implant Date: 20150408
Lead Channel Impedance Value: 266 Ohm
Lead Channel Impedance Value: 380 Ohm
Lead Channel Impedance Value: 399 Ohm
Lead Channel Impedance Value: 4047 Ohm
Lead Channel Impedance Value: 4047 Ohm
Lead Channel Impedance Value: 4047 Ohm
Lead Channel Impedance Value: 4047 Ohm
Lead Channel Impedance Value: 4047 Ohm
Lead Channel Impedance Value: 4047 Ohm
Lead Channel Impedance Value: 4047 Ohm
Lead Channel Impedance Value: 4047 Ohm
Lead Channel Impedance Value: 4047 Ohm
Lead Channel Impedance Value: 4047 Ohm
Lead Channel Pacing Threshold Amplitude: 1 V
Lead Channel Pacing Threshold Amplitude: 2 V
Lead Channel Pacing Threshold Pulse Width: 0.4 ms
Lead Channel Pacing Threshold Pulse Width: 1 ms
Lead Channel Sensing Intrinsic Amplitude: 0.75 mV
Lead Channel Setting Pacing Amplitude: 2.5 V
Lead Channel Setting Pacing Amplitude: 3 V
Lead Channel Setting Pacing Pulse Width: 0.4 ms
Lead Channel Setting Sensing Sensitivity: 0.3 mV

## 2019-12-25 NOTE — Progress Notes (Signed)
seborrheic keratosis include Coumadin) forf Patient Care Team: Charolette Forward, MD as PCP - General (Cardiology) Deboraha Sprang, MD as PCP - Electrophysiology (Cardiology)   HPI  [name] is a 69 y.o. male is seen in followup for ventricular tachycardia in the setting of ischemic and nonischemic heart disease. He is status post ICD implantation.  He has multiple failed attempts at left ventricular lead placement  2010 because of symptoms of progressive shortness of breath, he underwent evaluation including an echo demonstrating an ejection fraction of 40% or so and his Myoview scan demonstrated no ischemia. Repeat myoview scan 8/13 demonstrated infarct with peri-infarct ischemia.catheterization February 2014 demonstrated no obstructive coronary disease.  He remains significantly short of breath. Reviewed records, this goes back a number of years, specifically with postprandial dyspnea No nocturnal dyspnea orthopnea or peripheral edema. No chest pain.  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 There comes at the right subclavian vein system was occluded. Past Medical History:  Diagnosis Date  . Automatic implantable cardioverter-defibrillator in situ 01/18/2011  . AV BLOCK, COMPLETE 12/05/2007   Qualifier: Diagnosis of  By: Caryl Comes, MD, Leonidas Romberg Mack Guise   . CAD (coronary artery disease)    a. s/p PCI/BMS of prox LAD in 1997;  b. 4/10 LHC: nonobs dzs. (pRCA 50-70%);  c.   Myoview 08/23/11 : Scar with peri-infarct  ischemia infecting the entire septum with a corresponding wall motion abnormality, moderate risk scan, EF 42%.  . Cardiac defibrillator in place 04/22/2013  . CHF (congestive heart failure) ([place]) 04/22/2013  . Chronic back pain    a. s/p multiple lumbar surgeries  . Chronic systolic CHF (congestive heart failure) ([place]) 05/08/2008   Qualifier: Diagnosis of  By: Caryl Comes, MD, Leonidas Romberg Mack Guise   . Complete heart block ([place])    a. h/o transient complete heart block in April 2009  . CORONARY ATHEROSCLEROSIS NATIVE CORONARY ARTERY 12/05/2007   Qualifier: Diagnosis of  By: Caryl Comes, MD, Leonidas Romberg Mack Guise   . DJD (degenerative joint disease)   . GERD (gastroesophageal reflux disease)   . Hyperlipidemia   . Ischemic cardiomyopathy    a. 05/2009 Echo: EF 40%, mild to mod glob HK, Gr 2 dd, mild LVH, Triv AI, Mild MR, PASP 37-52mmHg  . Midsternal chest pain 08/17/2011  . Monomorphic ventricular tachycardia ([place])    a. s/p ICD 1997;   b. 06/2007 ICD upgrade to MDT Concerto Bi-V though LV lead unable to be placed.  . Myasthenia gravis   . Myasthenia gravis associated with thymoma ([place]) 04/22/2013  . MYASTHENIA GRAVIS WITHOUT EXACERBATION 12/05/2007   Qualifier: Diagnosis of  By: Caryl Comes, MD, Leonidas Romberg Mack Guise   . Sciatica   . VENTRICULAR TACHYCARDIA 12/05/2007   Qualifier: Diagnosis of  By: Caryl Comes, MD, [facility], Mack Guise     Past Surgical History:  Procedure Laterality Date  . anterior lumbar interbody fusion  04/23/2007   L4-L5   . BI-VENTRICULAR IMPLANTABLE CARDIOVERTER DEFIBRILLATOR UPGRADE N/A 04/10/2013   failed CRTD upgrade  . BI-VENTRICULAR PACEMAKER UPGRADE N/A 10/14/2013   failed CRTD upgrade  . CARDIAC DEFIBRILLATOR PLACEMENT  2001; 2008   single chamber MDT ICD implanted for secondary prevention; gen change 2008; explantation 2009  . CORONARY ANGIOPLASTY WITH STENT PLACEMENT     bare metal stenting of the LAD in 1997  . GALLBLADDER SURGERY    . IMPLANTABLE CARDIOVERTER DEFIBRILLATOR  IMPLANT  2009; 2015   MDT dual chamber right sided device implanted 2009 with gen change 2015; unable to place LV lead    Current Outpatient Medications  Medication Sig Dispense Refill  . aspirin EC 81 MG tablet Take 1 tablet (81 mg total) by mouth daily.    Marland Kitchen atorvastatin (LIPITOR) 40 MG tablet Take 40 mg by mouth daily.    Marland Kitchen azaTHIOprine (IMURAN) 50 MG tablet Take 150 mg by mouth daily.    . carvedilol (COREG) 3.125 MG tablet Take 1 tablet (3.125 mg total) by mouth 2 (two) times daily with a meal. Please make overdue appt with Dr. Caryl Comes. 2nd attempt 30 tablet 0  . furosemide (LASIX) 40 MG tablet Take 1.5 tablets (60 mg total) by mouth daily. 135 tablet 3  . HYDROcodone-acetaminophen (NORCO) 10-325 MG per tablet Take 1 tablet by mouth every 6 (six) hours as needed for severe pain.    Marland Kitchen ipratropium (ATROVENT) 0.03 % nasal spray Place 2 sprays into both nostrils 3 (three) times daily as needed for rhinitis.    . Nutritional Supplements (GRAPESEED EXTRACT PO) Take 2 capsules by mouth daily.    Marland Kitchen omeprazole (PRILOSEC) 40 MG capsule Take 40 mg by mouth daily.    Marland Kitchen OVER THE COUNTER MEDICATION Take 1 tablet by mouth daily. Blue green algae    . OVER THE COUNTER MEDICATION Take 2 tablets by mouth daily. Digestive enzymes    . Polyvinyl Alcohol-Povidone (REFRESH OP) Place 1 drop into both eyes daily as needed (red eyes).    . potassium chloride SA (K-DUR,KLOR-CON) 20 MEQ tablet TAKE 1 TABLET BY MOUTH  DAILY ( MUST KEEP UPCOMING APPOINTMENT FOR FURTHER REFILLS) 90 tablet 3  . predniSONE (DELTASONE) 5 MG tablet Take 5 mg by mouth every other day.    . sacubitril-valsartan (ENTRESTO) 49-51 MG Take 1 tablet by mouth 2 (two) times daily.    . simvastatin (ZOCOR) 40 MG tablet TAKE ONE TABLET BY MOUTH ONCE DAILY 90 tablet 1  . sotalol (BETAPACE) 120 MG tablet Take 1 tablet by mouth two times daily. Patient is overdue for an appointment and needs to call and schedule for further refills 2nd attempt 30 tablet 0    No current facility-administered medications for this visit.    Allergies  Allergen Reactions  . Procainamide Other (See Comments)    Allergy is from [facility] records - pt is not aware of any reaction to this medicine  . Quinidine Other (See Comments)    Allergy is from [facility] records - pt is not aware of any reaction to this medicine    Review of Systems negative except from HPI and PMH  Physical Exam BP 132/86   Pulse 90   Ht 5\' 8"  (1.727 m)   Wt 192 lb 6.4 oz (87.3 kg)   SpO2 99%   BMI 29.25 kg/m  Well developed and well nourished in no acute distress HENT normal Neck supple with JVP-flat Clear Device pocket well healed; without  hematoma or erythema.  There is no tethering  Regular rate and rhythm, no   murmur Abd-soft with active BS No Clubbing cyanosis   edema Skin-warm and dry A & Oriented  Grossly normal sensory and motor function  ECG sinus with P synchronous pacing Assessment and  Plan   Ischemic and nonischemic cardiomyopathy  Ventricular tachycardia  Complete heart block with RV apical pacing  Chronic systolic heart failure  \High Risk Medication Surveillance  Implantable defibrillator Med[employer]onic The patient's device was interrogated and the information was fully reviewed.  The device was reprogrammed to decrease RA pacing 2/2 elevated RA threshold .     Hypertension  Significant dyspnea on exertion. Not sure whether is from a cardiac or pulmonary problem I will reach out to the heart failure team to see whether CPX can help [place] incriminate or not the elevated left hemidiaphragm as a significant contribution to his dyspnea. In the event that it is pulmonary in origin, significant efforts to try to resynchronize his heart are including possible surgery would not be worth considering. Not withstanding his ejection fraction, if it seems to be a cardiac limitation and he has access to the heart from the right or left subclavian venous system,  would consider left bundle branch area pacing lead.Marland Kitchen Heard back and we will proceed with CPX.  He needs surveillance laboratories today for his sotalol with a metabolic profile and magnesium  No interval ventricular tachycardia  Blood pressure reasonably controlled  Might anticipate LBB pacing at gen change

## 2019-12-25 NOTE — Patient Instructions (Signed)
Medication Instructions:  Your physician recommends that you continue on your current medications as directed. Please refer to the Current Medication list given to you today.  *If you need a refill on your cardiac medications before your next appointment, please call your pharmacy*   Lab Work: BMET and Mg today  If you have labs (blood work) drawn today and your tests are completely normal, you will receive your results only by: Marland Kitchen MyChart Message (if you have MyChart) OR . A paper copy in the mail If you have any lab test that is abnormal or we need to change your treatment, we will call you to review the results.   Testing/Procedures: None ordered.    Follow-Up: At [facility], you and your health needs are our priority.  As part of our continuing mission to provide you with exceptional heart care, we have created designated Provider Care Teams.  These Care Teams include your primary Cardiologist (physician) and Advanced Practice Providers (APPs -  Physician Assistants and Nurse Practitioners) who all work together to provide you with the care you need, when you need it.  We recommend signing up for the patient portal called "MyChart".  Sign up information is provided on this After Visit Summary.  MyChart is used to connect with patients for Virtual Visits (Telemedicine).  Patients are able to view lab/test results, encounter notes, upcoming appointments, etc.  Non-urgent messages can be sent to your provider as well.   To learn more about what you can do with MyChart, go to [email or web address].    Your next appointment:   12 month(s)  The format for your next appointment:   In Person  Provider:   Virl Axe, MD

## 2019-12-26 LAB — BASIC METABOLIC PANEL
BUN/Creatinine Ratio: 11 (ref 10–24)
BUN: 11 mg/dL (ref 8–27)
CO2: 29 mmol/L (ref 20–29)
Calcium: 9.1 mg/dL (ref 8.6–10.2)
Chloride: 103 mmol/L (ref 96–106)
Creatinine, Ser: 0.96 mg/dL (ref 0.76–1.27)
GFR calc Af Amer: 92 mL/min/{1.73_m2} (ref >59)
GFR calc non Af Amer: 79 mL/min/{1.73_m2} (ref >59)
Glucose: 87 mg/dL (ref 65–99)
Potassium: 4.4 mmol/L (ref 3.5–5.2)
Sodium: 142 mmol/L (ref 134–144)

## 2019-12-26 LAB — MAGNESIUM: Magnesium: 1.9 mg/dL (ref 1.6–2.3)

## 2020-01-01 ENCOUNTER — Telehealth: Payer: Self-pay | Admitting: Internal Medicine

## 2020-01-01 NOTE — Telephone Encounter (Signed)
Patient states he is requesting to speak with Dr. Odessa Fleming nurse regarding a procedure he had in 2017 for his back. He states he had the procedure with Dr. Barnett Abu of [facility], but during the procedure there was an error and he assumes this is the reason he is having other complications. Please return call to discuss further.

## 2020-01-01 NOTE — Telephone Encounter (Signed)
Spoke with pt who reports he confirmed with "his lady friend" who accompanied pt to his surgery in 2017 that his surgery took longer than expected and apparently the surgeon had to call in another surgeon for assistance.  Pt states he and Dr Graciela Husbands had discussed at his last visit some anomaly with his anatomy r/t his thymectomy surgery.  He is calling today to confirm there were complications during his surgery.  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  RN thanked pt for calling to confirm his surgical complication.

## 2020-01-08 ENCOUNTER — Telehealth: Payer: Self-pay

## 2020-01-08 NOTE — Telephone Encounter (Signed)
Pt is returning call.  

## 2020-01-08 NOTE — Telephone Encounter (Signed)
Attempted return phone call to pt.  Left voicemail message to contact RN at [phone].

## 2020-01-08 NOTE — Telephone Encounter (Signed)
Attempted phone call to pt.  Left voicemail message to contact RN at [phone]. 

## 2020-01-08 NOTE — Telephone Encounter (Signed)
-----   Message from Duke Salvia, MD sent at 01/03/2020  2:08 PM EST -----  Please inform patient that drug surveillance labs are normal

## 2020-01-09 DIAGNOSIS — E785 Hyperlipidemia, unspecified: Secondary | ICD-10-CM | POA: Diagnosis not present

## 2020-01-09 DIAGNOSIS — I10 Essential (primary) hypertension: Secondary | ICD-10-CM | POA: Diagnosis not present

## 2020-01-09 NOTE — Telephone Encounter (Signed)
Patient came into the office and was given a copy of his lab results.

## 2020-01-27 ENCOUNTER — Ambulatory Visit (INDEPENDENT_AMBULATORY_CARE_PROVIDER_SITE_OTHER): Payer: Medicare Other

## 2020-01-27 DIAGNOSIS — I4729 Other ventricular tachycardia: Secondary | ICD-10-CM

## 2020-01-27 DIAGNOSIS — I472 Ventricular tachycardia: Secondary | ICD-10-CM

## 2020-01-29 LAB — CUP PACEART REMOTE DEVICE CHECK
Battery Remaining Longevity: 2 mo
Battery Voltage: 2.77 V
Brady Statistic AP VP Percent: 0.35 %
Brady Statistic AP VS Percent: 0 %
Brady Statistic AS VP Percent: 99.49 %
Brady Statistic AS VS Percent: 0.16 %
Brady Statistic RA Percent Paced: 0.35 %
Brady Statistic RV Percent Paced: 99.8 %
Date Time Interrogation Session: 20220125061606
HighPow Impedance: 44 Ohm
HighPow Impedance: 50 Ohm
Implantable Lead Implant Date: 20090619
Implantable Lead Implant Date: 20090619
Implantable Lead Location: 753859
Implantable Lead Location: 753860
Implantable Lead Model: 5076
Implantable Lead Model: 7120
Implantable Pulse Generator Implant Date: 20150408
Lead Channel Impedance Value: 304 Ohm
Lead Channel Impedance Value: 380 Ohm
Lead Channel Impedance Value: 399 Ohm
Lead Channel Impedance Value: 4047 Ohm
Lead Channel Impedance Value: 4047 Ohm
Lead Channel Impedance Value: 4047 Ohm
Lead Channel Impedance Value: 4047 Ohm
Lead Channel Impedance Value: 4047 Ohm
Lead Channel Impedance Value: 4047 Ohm
Lead Channel Impedance Value: 4047 Ohm
Lead Channel Impedance Value: 4047 Ohm
Lead Channel Impedance Value: 4047 Ohm
Lead Channel Impedance Value: 4047 Ohm
Lead Channel Pacing Threshold Amplitude: 1 V
Lead Channel Pacing Threshold Amplitude: 2.5 V
Lead Channel Pacing Threshold Pulse Width: 0.4 ms
Lead Channel Pacing Threshold Pulse Width: 0.4 ms
Lead Channel Sensing Intrinsic Amplitude: 0.875 mV
Lead Channel Sensing Intrinsic Amplitude: 0.875 mV
Lead Channel Sensing Intrinsic Amplitude: 14.625 mV
Lead Channel Sensing Intrinsic Amplitude: 14.625 mV
Lead Channel Setting Pacing Amplitude: 2.5 V
Lead Channel Setting Pacing Amplitude: 3 V
Lead Channel Setting Pacing Pulse Width: 0.4 ms
Lead Channel Setting Sensing Sensitivity: 0.3 mV

## 2020-02-04 ENCOUNTER — Ambulatory Visit (HOSPITAL_COMMUNITY): Payer: Medicare Other | Attending: Internal Medicine

## 2020-02-04 ENCOUNTER — Other Ambulatory Visit: Payer: Self-pay

## 2020-02-04 DIAGNOSIS — R0602 Shortness of breath: Secondary | ICD-10-CM | POA: Diagnosis not present

## 2020-02-07 ENCOUNTER — Telehealth: Payer: Self-pay

## 2020-02-07 DIAGNOSIS — R0602 Shortness of breath: Secondary | ICD-10-CM

## 2020-02-07 NOTE — Telephone Encounter (Signed)
-----   Message from Deboraha Sprang, MD sent at 02/05/2020  7:34 PM EST ----- Please Inform Patient that mild abnormality is present w multiple factors contributing to dyspnea including lung issues and the diaphragm problem, a HR that doesn't go up as much as it might and perhaps some heart weakness For now nothing standsout as an easy fix.  Would he be open to trying pulm rehab Thanks

## 2020-02-07 NOTE — Addendum Note (Signed)
Addended by: Cheri Kearns A on: 02/07/2020 09:56 AM   Modules accepted: Level of Service

## 2020-02-07 NOTE — Telephone Encounter (Signed)
Attempted phone call to pt.  Left voicemail message to contact RN at [phone]. 

## 2020-02-07 NOTE — Telephone Encounter (Signed)
Patient returning call.

## 2020-02-07 NOTE — Progress Notes (Signed)
Remote ICD transmission.   

## 2020-02-10 NOTE — Telephone Encounter (Signed)
Attempted phone call to pt and left voicemail message to contact RN at [phone]. 

## 2020-02-14 DIAGNOSIS — Z20822 Contact with and (suspected) exposure to covid-19: Secondary | ICD-10-CM | POA: Diagnosis not present

## 2020-02-14 DIAGNOSIS — J069 Acute upper respiratory infection, unspecified: Secondary | ICD-10-CM | POA: Diagnosis not present

## 2020-02-14 DIAGNOSIS — R0981 Nasal congestion: Secondary | ICD-10-CM | POA: Diagnosis not present

## 2020-02-14 DIAGNOSIS — R0982 Postnasal drip: Secondary | ICD-10-CM | POA: Diagnosis not present

## 2020-02-14 DIAGNOSIS — R059 Cough, unspecified: Secondary | ICD-10-CM | POA: Diagnosis not present

## 2020-02-19 NOTE — Telephone Encounter (Signed)
Attempted phone call to pt and left voicemail to contact RN at [phone]. 

## 2020-02-25 NOTE — Telephone Encounter (Signed)
Spoke with pt and reviewed results as below Dr Olin Pia recommendation for Pulmonary rehab.  Pt is agreeable.  Referral placed and pt advised he will be contacted.  Pt verbalizes understanding and agrees with current plan.

## 2020-02-26 ENCOUNTER — Ambulatory Visit (INDEPENDENT_AMBULATORY_CARE_PROVIDER_SITE_OTHER): Payer: Medicare Other

## 2020-02-26 DIAGNOSIS — I442 Atrioventricular block, complete: Secondary | ICD-10-CM

## 2020-02-27 LAB — CUP PACEART REMOTE DEVICE CHECK
Battery Remaining Longevity: 1 mo
Battery Voltage: 2.71 V
Brady Statistic AP VP Percent: 0.65 %
Brady Statistic AP VS Percent: 0 %
Brady Statistic AS VP Percent: 99.31 %
Brady Statistic AS VS Percent: 0.04 %
Brady Statistic RA Percent Paced: 0.64 %
Brady Statistic RV Percent Paced: 99.93 %
Date Time Interrogation Session: 20220223123326
HighPow Impedance: 40 Ohm
HighPow Impedance: 44 Ohm
Implantable Lead Implant Date: 20090619
Implantable Lead Implant Date: 20090619
Implantable Lead Location: 753859
Implantable Lead Location: 753860
Implantable Lead Model: 5076
Implantable Lead Model: 7120
Implantable Pulse Generator Implant Date: 20150408
Lead Channel Impedance Value: 266 Ohm
Lead Channel Impedance Value: 342 Ohm
Lead Channel Impedance Value: 380 Ohm
Lead Channel Impedance Value: 4047 Ohm
Lead Channel Impedance Value: 4047 Ohm
Lead Channel Impedance Value: 4047 Ohm
Lead Channel Impedance Value: 4047 Ohm
Lead Channel Impedance Value: 4047 Ohm
Lead Channel Impedance Value: 4047 Ohm
Lead Channel Impedance Value: 4047 Ohm
Lead Channel Impedance Value: 4047 Ohm
Lead Channel Impedance Value: 4047 Ohm
Lead Channel Impedance Value: 4047 Ohm
Lead Channel Pacing Threshold Amplitude: 1.125 V
Lead Channel Pacing Threshold Amplitude: 2.5 V
Lead Channel Pacing Threshold Pulse Width: 0.4 ms
Lead Channel Pacing Threshold Pulse Width: 0.4 ms
Lead Channel Sensing Intrinsic Amplitude: 1 mV
Lead Channel Sensing Intrinsic Amplitude: 1 mV
Lead Channel Sensing Intrinsic Amplitude: 13.875 mV
Lead Channel Sensing Intrinsic Amplitude: 13.875 mV
Lead Channel Setting Pacing Amplitude: 2.5 V
Lead Channel Setting Pacing Amplitude: 3 V
Lead Channel Setting Pacing Pulse Width: 0.4 ms
Lead Channel Setting Sensing Sensitivity: 0.3 mV

## 2020-03-02 DIAGNOSIS — G7000 Myasthenia gravis without (acute) exacerbation: Secondary | ICD-10-CM | POA: Diagnosis not present

## 2020-03-02 DIAGNOSIS — I38 Endocarditis, valve unspecified: Secondary | ICD-10-CM | POA: Diagnosis not present

## 2020-03-02 DIAGNOSIS — I10 Essential (primary) hypertension: Secondary | ICD-10-CM | POA: Diagnosis not present

## 2020-03-02 DIAGNOSIS — E785 Hyperlipidemia, unspecified: Secondary | ICD-10-CM | POA: Diagnosis not present

## 2020-03-02 DIAGNOSIS — I25118 Atherosclerotic heart disease of native coronary artery with other forms of angina pectoris: Secondary | ICD-10-CM | POA: Diagnosis not present

## 2020-03-02 DIAGNOSIS — I5020 Unspecified systolic (congestive) heart failure: Secondary | ICD-10-CM | POA: Diagnosis not present

## 2020-03-02 DIAGNOSIS — I255 Ischemic cardiomyopathy: Secondary | ICD-10-CM | POA: Diagnosis not present

## 2020-03-02 DIAGNOSIS — I472 Ventricular tachycardia: Secondary | ICD-10-CM | POA: Diagnosis not present

## 2020-03-05 NOTE — Addendum Note (Signed)
Addended by: Cheri Kearns A on: 03/05/2020 09:12 AM   Modules accepted: Level of Service

## 2020-03-05 NOTE — Progress Notes (Signed)
Remote ICD transmission.   

## 2020-03-06 ENCOUNTER — Encounter (HOSPITAL_COMMUNITY): Payer: Self-pay | Admitting: *Deleted

## 2020-03-06 NOTE — Progress Notes (Signed)
Received referral from Dr. Caryl Comes for this pt to participate in pulmonary rehab with the the diagnosis of Shortness of Breath. Clinical review of pt follow up appt on 12/22 EP office note.  Pt completed CPX on 2/1. Pt with Covid Risk Score - 6. Pt appropriate for scheduling for Pulmonary rehab.  Will forward to support staff for scheduling and verification of insurance eligibility/benefits with pt consent. Cherre Huger, BSN Cardiac and Training and development officer

## 2020-03-09 ENCOUNTER — Telehealth: Payer: Self-pay | Admitting: *Deleted

## 2020-03-09 NOTE — Telephone Encounter (Signed)
-----   Message from Zollie Beckers sent at 03/09/2020  4:23 PM EST ----- Hello!   Patient has contacted the wrong office twice today and has left a voicemail about his defibrillator firing. Wanted to let you know so you can contact patient.   Thanks,   Landis Martins, MS, ACSM, [facility] Clinical Exercise Physiologist/ Health and Wellness Coach

## 2020-03-09 NOTE — Telephone Encounter (Signed)
As of 5:25pm 03/09/20  no new transmission received yet.  Last transmission in carelink was 02/26/20

## 2020-03-09 NOTE — Telephone Encounter (Signed)
The patient states his icd did not go fire but it was beeping. I asked him was he near any magnets and he states no. I asked him to send a manual transmission for the nurse to review.

## 2020-03-10 ENCOUNTER — Ambulatory Visit (INDEPENDENT_AMBULATORY_CARE_PROVIDER_SITE_OTHER): Payer: Medicare Other | Admitting: Emergency Medicine

## 2020-03-10 ENCOUNTER — Other Ambulatory Visit: Payer: Self-pay

## 2020-03-10 DIAGNOSIS — I255 Ischemic cardiomyopathy: Secondary | ICD-10-CM

## 2020-03-10 DIAGNOSIS — I442 Atrioventricular block, complete: Secondary | ICD-10-CM

## 2020-03-10 LAB — CUP PACEART INCLINIC DEVICE CHECK
Battery Remaining Longevity: 1 mo — CL
Battery Voltage: 2.71 V
Brady Statistic AP VP Percent: 0.49 %
Brady Statistic AP VS Percent: 0 %
Brady Statistic AS VP Percent: 99.4 %
Brady Statistic AS VS Percent: 0.11 %
Brady Statistic RA Percent Paced: 0.49 %
Brady Statistic RV Percent Paced: 99.83 %
Date Time Interrogation Session: 20220308160129
HighPow Impedance: 43 Ohm
HighPow Impedance: 48 Ohm
Implantable Lead Implant Date: 20090619
Implantable Lead Implant Date: 20090619
Implantable Lead Location: 753859
Implantable Lead Location: 753860
Implantable Lead Model: 5076
Implantable Lead Model: 7120
Implantable Pulse Generator Implant Date: 20150408
Lead Channel Impedance Value: 266 Ohm
Lead Channel Impedance Value: 380 Ohm
Lead Channel Impedance Value: 399 Ohm
Lead Channel Impedance Value: 4047 Ohm
Lead Channel Impedance Value: 4047 Ohm
Lead Channel Impedance Value: 4047 Ohm
Lead Channel Impedance Value: 4047 Ohm
Lead Channel Impedance Value: 4047 Ohm
Lead Channel Impedance Value: 4047 Ohm
Lead Channel Impedance Value: 4047 Ohm
Lead Channel Impedance Value: 4047 Ohm
Lead Channel Impedance Value: 4047 Ohm
Lead Channel Impedance Value: 4047 Ohm
Lead Channel Pacing Threshold Amplitude: 1.25 V
Lead Channel Pacing Threshold Amplitude: 2.25 V
Lead Channel Pacing Threshold Pulse Width: 0.4 ms
Lead Channel Pacing Threshold Pulse Width: 0.4 ms
Lead Channel Sensing Intrinsic Amplitude: 0.875 mV
Lead Channel Sensing Intrinsic Amplitude: 13.875 mV
Lead Channel Setting Pacing Amplitude: 2.5 V
Lead Channel Setting Pacing Amplitude: 3 V
Lead Channel Setting Pacing Pulse Width: 0.4 ms
Lead Channel Setting Sensing Sensitivity: 0.3 mV

## 2020-03-10 NOTE — Patient Instructions (Signed)
[phone]  Device clinic   Open Monday-Friday 8am-5pm

## 2020-03-10 NOTE — Telephone Encounter (Signed)
LMOVM for pt to return call and send manual transmission.

## 2020-03-10 NOTE — Telephone Encounter (Signed)
Patient called in to let us know that he is waiting on a new handheld from [place] as his is dead and it takes about 7-10 business days. Patient says his implanted device alarms him every morning and its frustrating. Patient was told as soon as the new handheld comes in to give [place] a call its very important that he call us.

## 2020-03-10 NOTE — Progress Notes (Signed)
ICD check in clinic for battery check due to device alarming and patient unable to send remote transmission.  Upon interrogation, noted device reached ERI 02/29/20.  RA and RV Thresholds and sensing consistent with previous device measurements. Historically elevated RA thresholds is a known issue.  Impedance trends stable over time. 15 AMS episodes (Burden (<0.1%).  No ventricular arrhythmias. Histogram distribution appropriate for patient and level of activity. Device programmed at appropriate safety margins. Pt enrolled in remote follow-up, although awaiting new handheld monitor.  Patient is to be scheduled asap for appt with APP to discuss gen changeout procedure.  Message sent to scheduling.  Alert for battery at [facility] programmed off today, patient v/u that gen Tonita Cong is necessary within next 2 months.

## 2020-03-16 ENCOUNTER — Telehealth: Payer: Self-pay | Admitting: Emergency Medicine

## 2020-03-16 NOTE — Telephone Encounter (Signed)
Contacted patient about previous device concerns. Patient has an appointment on 03/17/2020 with Oda Kilts PA. Patient's device is making noise because it has reached RRT. Advised patient that this alert could be disabled or reset during his appointment on 03/17/2020.

## 2020-03-17 ENCOUNTER — Encounter: Payer: Self-pay | Admitting: Internal Medicine

## 2020-03-17 ENCOUNTER — Ambulatory Visit (INDEPENDENT_AMBULATORY_CARE_PROVIDER_SITE_OTHER): Payer: Medicare Other | Admitting: Internal Medicine

## 2020-03-17 ENCOUNTER — Encounter: Payer: Medicare Other | Admitting: Student

## 2020-03-17 ENCOUNTER — Other Ambulatory Visit: Payer: Self-pay

## 2020-03-17 VITALS — BP 90/60 | HR 81 | Ht 68.0 in | Wt 184.8 lb

## 2020-03-17 DIAGNOSIS — I442 Atrioventricular block, complete: Secondary | ICD-10-CM | POA: Diagnosis not present

## 2020-03-17 DIAGNOSIS — Z95810 Presence of automatic (implantable) cardiac defibrillator: Secondary | ICD-10-CM | POA: Diagnosis not present

## 2020-03-17 DIAGNOSIS — I255 Ischemic cardiomyopathy: Secondary | ICD-10-CM

## 2020-03-17 DIAGNOSIS — I472 Ventricular tachycardia: Secondary | ICD-10-CM | POA: Diagnosis not present

## 2020-03-17 DIAGNOSIS — I4729 Other ventricular tachycardia: Secondary | ICD-10-CM

## 2020-03-17 NOTE — Patient Instructions (Addendum)
Medication Instructions:  Your physician recommends that you continue on your current medications as directed. Please refer to the Current Medication list given to you today.  Labwork: None ordered.  Testing/Procedures: None ordered.  Follow-Up:  Dr. Lovena Le will discuss your case with Dr. Caryl Comes and I will call you with the plan.  Any Other Special Instructions Will Be Listed Below (If Applicable).  If you need a refill on your cardiac medications before your next appointment, please call your pharmacy.

## 2020-03-17 NOTE — Progress Notes (Signed)
HPI [name] is referred by Dr. Caryl Comes to consider various options for his ICD system. He is a pleasant 70 yo man with CHB, VT, and non-obstructive CAD. He underwent initial ICD insertion after syncope in 1997. He had abandonement of his left sided system and then inserton of a right sided ICD, with failure to place a biv system. 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 The ICD lead is abandoned on the left. He has class 3 CHF symptoms. He has not recently had any ICD shocks. He has a paced QRS duration of 192 ms. Allergies  Allergen Reactions  . Procainamide Other (See Comments)    Allergy is from [facility] records - pt is not aware of any reaction to this medicine  . Quinidine Other (See Comments)    Allergy is from [facility] records - pt is not aware of any reaction to this medicine     Current Outpatient Medications  Medication Sig Dispense Refill  . aspirin EC 81 MG tablet Take 1 tablet (81 mg total) by mouth daily.    Marland Kitchen atorvastatin (LIPITOR) 40 MG tablet Take 40 mg by mouth daily.    Marland Kitchen azaTHIOprine (IMURAN) 50 MG tablet Take 150 mg by mouth daily.    . carvedilol (COREG) 3.125 MG tablet Take 1 tablet (3.125 mg total) by mouth 2 (two) times daily with a meal. Please make overdue appt with Dr. Caryl Comes. 2nd attempt 30 tablet 0  . fluticasone (FLONASE) 50 MCG/ACT nasal spray Place 1 spray into both nostrils as needed for allergies.    . furosemide (LASIX) 40 MG tablet Take 1.5 tablets (60 mg total) by mouth daily. 135 tablet 3  . Grape Seed Extract 50 MG CAPS Take 2 capsules by mouth daily.    Marland Kitchen HYDROcodone-acetaminophen (NORCO) 10-325 MG per tablet Take 1 tablet by mouth every 6 (six) hours as needed for severe pain.    Marland Kitchen ipratropium (ATROVENT) 0.03 % nasal spray Place 2 sprays into both nostrils 3 (three) times daily as needed for rhinitis.    . Multiple Vitamin (MULTIVITAMIN) capsule Take 1 capsule by  mouth daily.    . Nutritional Supplements (GRAPESEED EXTRACT PO) Take 2 capsules by mouth daily.    Marland Kitchen omeprazole (PRILOSEC) 40 MG capsule Take 40 mg by mouth daily.    Marland Kitchen OVER THE COUNTER MEDICATION Take 1 tablet by mouth daily. Blue green algae    . OVER THE COUNTER MEDICATION Take 2 tablets by mouth daily. Digestive enzymes    . Polyvinyl Alcohol-Povidone (REFRESH OP) Place 1 drop into both eyes daily as needed (red eyes).    . potassium chloride SA (K-DUR,KLOR-CON) 20 MEQ tablet TAKE 1 TABLET BY MOUTH  DAILY ( MUST KEEP UPCOMING APPOINTMENT FOR FURTHER REFILLS) 90 tablet 3  . predniSONE (DELTASONE) 5 MG tablet Take 5 mg by mouth every other day.    . sacubitril-valsartan (ENTRESTO) 49-51 MG Take 1 tablet by mouth 2 (two) times daily.    . sotalol (BETAPACE) 120 MG tablet Take 1 tablet by mouth two times daily. Patient is overdue for an appointment and needs to call and schedule for further refills 2nd attempt 30 tablet 0   No current facility-administered medications for this visit.     Past Medical History:  Diagnosis Date  . Automatic implantable cardioverter-defibrillator in situ 01/18/2011  . AV BLOCK, COMPLETE 12/05/2007   Qualifier: Diagnosis of  ByCaryl Comes, MD, [facility], [facility]   . CAD (coronary artery disease)    a. s/p PCI/BMS of prox LAD in 1997;  b. 4/10 LHC: nonobs dzs. (pRCA 50-70%);  c.   Myoview 08/23/11 : Scar with peri-infarct ischemia infecting the entire septum with a corresponding wall motion abnormality, moderate risk scan, EF 42%.  . Cardiac defibrillator in place 04/22/2013  . CHF (congestive heart failure) ([place]) 04/22/2013  . Chronic back pain    a. s/p multiple lumbar surgeries  . Chronic systolic CHF (congestive heart failure) ([place]) 05/08/2008   Qualifier: Diagnosis of  By: Caryl Comes, MD, [facility]   . Complete heart block ([place])    a. h/o transient complete heart block in April 2009  . CORONARY ATHEROSCLEROSIS NATIVE CORONARY ARTERY 12/05/2007   Qualifier:  Diagnosis of  By: Caryl Comes, MD, [facility] Guise   . DJD (degenerative joint disease)   . GERD (gastroesophageal reflux disease)   . Hyperlipidemia   . Ischemic cardiomyopathy    a. 05/2009 Echo: EF 40%, mild to mod glob HK, Gr 2 dd, mild LVH, Triv AI, Mild MR, PASP 37-41mmHg  . Midsternal chest pain 08/17/2011  . 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 s/p ICD 1997;   b. 06/2007 ICD upgrade to MDT Concerto Bi-V though LV lead unable to be placed.  . Myasthenia gravis   . Myasthenia gravis associated with thymoma ([place]) 04/22/2013  . MYASTHENIA GRAVIS WITHOUT EXACERBATION 12/05/2007   Qualifier: Diagnosis of  By: Caryl Comes, MD, [facility]ise   . Sciatica   . VENTRICULAR TACHYCARDIA 12/05/2007   Qualifier: Diagnosis of  By: Caryl Comes, MD, Remus Blake     ROS:   All systems reviewed and negative except as noted in the HPI.   Past Surgical History:  Procedure Laterality Date  . anterior lumbar interbody fusion  04/23/2007   L4-L5   . BI-VENTRICULAR IMPLANTABLE CARDIOVERTER DEFIBRILLATOR UPGRADE N/A 04/10/2013   failed CRTD upgrade  . BI-VENTRICULAR PACEMAKER UPGRADE N/A 10/14/2013   failed CRTD upgrade  . CARDIAC DEFIBRILLATOR PLACEMENT  2001; 2008   single chamber MDT ICD implanted for secondary prevention; gen change 2008; explantation 2009  . CORONARY ANGIOPLASTY WITH STENT PLACEMENT     bare metal stenting of the LAD in 1997  . GALLBLADDER SURGERY    . IMPLANTABLE CARDIOVERTER DEFIBRILLATOR IMPLANT  2009; 2015   MDT dual chamber right sided device implanted 2009 with gen change 2015; unable to place LV lead     Family History  Problem Relation Age of Onset  . Heart disease Mother   . Heart disease Father   . Coronary artery disease Brother   . Hypertension Brother   . Diabetes Neg Hx   . Stroke Neg Hx      Social History   Socioeconomic History  . Marital status: Single    Spouse name: Not on file  . Number of children: Not on file  . Years of  education: Not on file  . Highest education level: Not on file  Occupational History  . Not on file  Tobacco Use  . Smoking status: Former Smoker    Packs/day: 0.30    Years: 6.00    Pack years: 1.80    Types: Cigarettes    Quit date: 01/03/1982    Years since quitting: 38.2  . Smokeless tobacco: Never Used  Substance and Sexual Activity  . Alcohol use: Yes    Comment: occasional  . Drug use: No  .  Sexual activity: Not on file  Other Topics Concern  . Not on file  Social History Narrative  . Not on file   Social Determinants of Health   Financial Resource Strain: Not on file  Food Insecurity: Not on file  Transportation Needs: Not on file  Physical Activity: Not on file  Stress: Not on file  Social Connections: Not on file  Intimate Partner Violence: Not on file     BP 90/60   Pulse 81   Ht 5\' 8"  (1.727 m)   Wt 184 lb 12.8 oz (83.8 kg)   SpO2 94%   BMI 28.10 kg/m   Physical Exam:  Well appearing NAD HEENT: Unremarkable Neck:  No JVD, no thyromegally Lymphatics:  No adenopathy Back:  No CVA tenderness Lungs:  Clear with no wheezes HEART:  Regular rate rhythm, no murmurs, no rubs, no clicks Abd:  soft, positive bowel sounds, no organomegally, no rebound, no guarding Ext:  2 plus pulses, no edema, no cyanosis, no clubbing Skin:  No rashes no nodules Neuro:  CN II through XII intact, motor grossly intact  EKG - P synchronous ventricular pacing  DEVICE  Normal device function except he has an elevated atrial pacing threshold.  See PaceArt for details.   Assess/Plan: 1. ICD - he is at [facility]. I have discussed the various treatment options from a gen change, atrial lead extraction and placement of a new RA and a left bundle area pacing. I think leaving the 1997 left sided system abandoned would be most reasonable. I will discuss all of the above with Dr. Renaldo Reel. 2. Chronic systolic heart failure - he has a paced QRS of 192 and has class 3 symptoms. He can hopefully be  resynchronized. 3. VT - he has done well on sotalol. Continue.  [name] Overlie Taylor,MD

## 2020-03-20 ENCOUNTER — Encounter: Payer: Medicare Other | Admitting: Student

## 2020-03-26 ENCOUNTER — Telehealth (HOSPITAL_COMMUNITY): Payer: Self-pay

## 2020-03-26 NOTE — Telephone Encounter (Signed)
Pt insurance is active and benefits verified through [facility]. Co-pay $0.00, DED $0.00/$0.00 met, out of pocket $7,550.00/$0.00 met, co-insurance 20%. No pre-authorization required. Justina W./[employer], 03/26/20 @ 257PM, HKV#[id number]

## 2020-03-26 NOTE — Telephone Encounter (Signed)
Called patient to see if he is interested in the Pulmonary Rehab Program. Patient expressed interest. Explained scheduling process and went over insurance, patient verbalized understanding. Will contact patient for scheduling once May schedule is open.

## 2020-03-27 ENCOUNTER — Telehealth: Payer: Self-pay

## 2020-03-27 DIAGNOSIS — I255 Ischemic cardiomyopathy: Secondary | ICD-10-CM

## 2020-03-27 DIAGNOSIS — T82110A Breakdown (mechanical) of cardiac electrode, initial encounter: Secondary | ICD-10-CM

## 2020-03-27 NOTE — Telephone Encounter (Signed)
Confirmed procedure for April 06, 2020  Pt will come to [facility] office on April 03, 2020 at 9;00 am to meet with this nurse to get lab work, go over instruction letter and get surgical scrub.  Covid test after lab work.  Work up complete.

## 2020-03-27 NOTE — Telephone Encounter (Signed)
Spoke with Pt.  Pt would like to schedule procedure 04/06/20  States he does not need any additional appts to discuss.  Will start scheduling process.

## 2020-04-03 ENCOUNTER — Other Ambulatory Visit: Payer: Self-pay

## 2020-04-03 ENCOUNTER — Other Ambulatory Visit (HOSPITAL_COMMUNITY)
Admission: RE | Admit: 2020-04-03 | Discharge: 2020-04-03 | Disposition: A | Discharge status: Home / Self Care | Payer: Medicare Other | Source: Ambulatory Visit | Attending: Internal Medicine | Admitting: Internal Medicine

## 2020-04-03 ENCOUNTER — Other Ambulatory Visit: Payer: Medicare Other | Admitting: *Deleted

## 2020-04-03 DIAGNOSIS — I255 Ischemic cardiomyopathy: Secondary | ICD-10-CM | POA: Diagnosis not present

## 2020-04-03 DIAGNOSIS — Z20822 Contact with and (suspected) exposure to covid-19: Secondary | ICD-10-CM | POA: Diagnosis not present

## 2020-04-03 DIAGNOSIS — T82110A Breakdown (mechanical) of cardiac electrode, initial encounter: Secondary | ICD-10-CM | POA: Diagnosis not present

## 2020-04-03 DIAGNOSIS — Z01812 Encounter for preprocedural laboratory examination: Secondary | ICD-10-CM | POA: Insufficient documentation

## 2020-04-03 LAB — BASIC METABOLIC PANEL WITH GFR
BUN/Creatinine Ratio: 10 (ref 10–24)
BUN: 10 mg/dL (ref 8–27)
CO2: 25 mmol/L (ref 20–29)
Calcium: 9.3 mg/dL (ref 8.6–10.2)
Chloride: 100 mmol/L (ref 96–106)
Creatinine, Ser: 1.03 mg/dL (ref 0.76–1.27)
Glucose: 115 mg/dL — ABNORMAL HIGH (ref 65–99)
Potassium: 4.3 mmol/L (ref 3.5–5.2)
Sodium: 139 mmol/L (ref 134–144)
eGFR: 78 mL/min/1.73

## 2020-04-03 LAB — CBC WITH DIFFERENTIAL/PLATELET
Basophils Absolute: 0.1 10*3/uL (ref 0.0–0.2)
Basos: 1 %
EOS (ABSOLUTE): 0.2 10*3/uL (ref 0.0–0.4)
Eos: 5 %
Hematocrit: 41.9 % (ref 37.5–51.0)
Hemoglobin: 14.1 g/dL (ref 13.0–17.7)
Immature Grans (Abs): 0 10*3/uL (ref 0.0–0.1)
Immature Granulocytes: 0 %
Lymphocytes Absolute: 1.7 10*3/uL (ref 0.7–3.1)
Lymphs: 34 %
MCH: 31.5 pg (ref 26.6–33.0)
MCHC: 33.7 g/dL (ref 31.5–35.7)
MCV: 94 fL (ref 79–97)
Monocytes Absolute: 0.7 10*3/uL (ref 0.1–0.9)
Monocytes: 15 %
Neutrophils Absolute: 2.2 10*3/uL (ref 1.4–7.0)
Neutrophils: 45 %
Platelets: 206 10*3/uL (ref 150–450)
RBC: 4.47 x10E6/uL (ref 4.14–5.80)
RDW: 12.2 % (ref 11.6–15.4)
WBC: 4.9 10*3/uL (ref 3.4–10.8)

## 2020-04-03 NOTE — Progress Notes (Signed)
I was unable to rerach [name] on the phone today, I tried 3 times , iI left messages with my phone number and asked patient to return my call.  I left the following instructions on patient's voice mail.I instructed - to arrive at 1230 come to the Main Entrance, Entrance 'A'.  You will go in and go to Admitting Office- it is down the hall on the left.  Do Not Eat or Drink after Midnight.   Monday morning, shower with antibiotic soap if you have it, rinse thoroughly.  Dry off with a clean towel. Do not wear any lotions, powders, colognes, deodorant. Men my shave face and neck. .  Brush your teeth. If you wear glasses, contact lens, hearing aids, dentures or partials; you may not wear them in OR, if you have a case for your glasses- bring it. It is best to not wear hearing aids- if it is the only way we can communicate wear them , bring the case- have someone to take the hearing aids and case after you are finished answering questions. Dentures and partials will be removed and placed in a denture cup and placed with your clothes.  Take the following medications with a sip of water: Imuran, Coreg, Prednisone, Solalol. Prn Eye gtts..  If you have any questions or problems or if you are going to be late call [phone], this is the pre- surgery desk; there may not be anyone at the number I called you from on Monday.  I also instructed [name] that he may call the pre- surgical desk Saturday or Sunday and speak to a nurse.   If you are going home after the surgery/procedure, you will need someone to drive you home and stay with you for the first 24 hours after surgery.  If you smoke do not smoke for 24 hours prior to surgery.   I called patient's sister [name]- there was no answer, I did not leave a message.

## 2020-04-04 LAB — SARS CORONAVIRUS 2 (TAT 6-24 HRS): SARS Coronavirus 2: NEGATIVE

## 2020-04-06 ENCOUNTER — Other Ambulatory Visit: Payer: Self-pay

## 2020-04-06 ENCOUNTER — Inpatient Hospital Stay (HOSPITAL_COMMUNITY): Payer: Medicare Other | Admitting: Anesthesiology

## 2020-04-06 ENCOUNTER — Ambulatory Visit (HOSPITAL_COMMUNITY)
Admission: RE | Admit: 2020-04-06 | Discharge: 2020-04-06 | Disposition: A | Discharge status: Home / Self Care | Payer: Medicare Other | Source: Home / Self Care | Attending: Internal Medicine | Admitting: Internal Medicine

## 2020-04-06 ENCOUNTER — Encounter (HOSPITAL_COMMUNITY)
Admission: RE | Disposition: A | Discharge status: Home / Self Care | Payer: Self-pay | Source: Home / Self Care | Attending: Internal Medicine

## 2020-04-06 ENCOUNTER — Encounter (HOSPITAL_COMMUNITY): Payer: Self-pay | Admitting: Internal Medicine

## 2020-04-06 ENCOUNTER — Inpatient Hospital Stay (HOSPITAL_COMMUNITY): Payer: Medicare Other

## 2020-04-06 DIAGNOSIS — Z20822 Contact with and (suspected) exposure to covid-19: Secondary | ICD-10-CM | POA: Diagnosis not present

## 2020-04-06 DIAGNOSIS — Z79899 Other long term (current) drug therapy: Secondary | ICD-10-CM | POA: Diagnosis not present

## 2020-04-06 DIAGNOSIS — Z538 Procedure and treatment not carried out for other reasons: Secondary | ICD-10-CM | POA: Insufficient documentation

## 2020-04-06 DIAGNOSIS — Z7982 Long term (current) use of aspirin: Secondary | ICD-10-CM | POA: Diagnosis not present

## 2020-04-06 DIAGNOSIS — I442 Atrioventricular block, complete: Secondary | ICD-10-CM | POA: Diagnosis not present

## 2020-04-06 DIAGNOSIS — I5022 Chronic systolic (congestive) heart failure: Secondary | ICD-10-CM | POA: Diagnosis not present

## 2020-04-06 DIAGNOSIS — I110 Hypertensive heart disease with heart failure: Secondary | ICD-10-CM | POA: Diagnosis not present

## 2020-04-06 DIAGNOSIS — K219 Gastro-esophageal reflux disease without esophagitis: Secondary | ICD-10-CM | POA: Diagnosis not present

## 2020-04-06 DIAGNOSIS — I5043 Acute on chronic combined systolic (congestive) and diastolic (congestive) heart failure: Secondary | ICD-10-CM

## 2020-04-06 DIAGNOSIS — I428 Other cardiomyopathies: Secondary | ICD-10-CM | POA: Diagnosis not present

## 2020-04-06 DIAGNOSIS — Z888 Allergy status to other drugs, medicaments and biological substances status: Secondary | ICD-10-CM | POA: Diagnosis not present

## 2020-04-06 DIAGNOSIS — G709 Myoneural disorder, unspecified: Secondary | ICD-10-CM | POA: Diagnosis not present

## 2020-04-06 DIAGNOSIS — Z87891 Personal history of nicotine dependence: Secondary | ICD-10-CM | POA: Diagnosis not present

## 2020-04-06 LAB — ECHO INTRAOPERATIVE TEE: Weight: 2992 oz

## 2020-04-06 LAB — PREPARE RBC (CROSSMATCH)

## 2020-04-06 LAB — SURGICAL PCR SCREEN
MRSA, PCR: NEGATIVE
Staphylococcus aureus: NEGATIVE

## 2020-04-06 SURGERY — ICD GENERATOR CHANGE
Anesthesia: General

## 2020-04-06 MED ORDER — SODIUM CHLORIDE 0.9 % IV SOLN: INTRAVENOUS | Status: DC

## 2020-04-06 MED ORDER — FENTANYL CITRATE (PF) 250 MCG/5ML IJ SOLN
INTRAMUSCULAR | Status: AC
  Filled 2020-04-06: qty 5

## 2020-04-06 MED ORDER — CEFAZOLIN SODIUM-DEXTROSE 2-4 GM/100ML-% IV SOLN: 2.0000 g | INTRAVENOUS | Status: DC

## 2020-04-06 MED ORDER — ORAL CARE MOUTH RINSE: 15.0000 mL | Freq: Once | OROMUCOSAL | Status: AC

## 2020-04-06 MED ORDER — CEFAZOLIN SODIUM-DEXTROSE 2-4 GM/100ML-% IV SOLN
INTRAVENOUS | Status: AC
  Filled 2020-04-06: qty 100

## 2020-04-06 MED ORDER — CHLORHEXIDINE GLUCONATE 0.12 % MT SOLN
OROMUCOSAL | Status: AC
  Administered 2020-04-06: 15 mL via OROMUCOSAL
  Filled 2020-04-06: qty 15

## 2020-04-06 MED ORDER — MIDAZOLAM HCL 2 MG/2ML IJ SOLN
INTRAMUSCULAR | Status: AC
  Filled 2020-04-06: qty 2

## 2020-04-06 MED ORDER — SODIUM CHLORIDE 0.9 % IV SOLN
80.0000 mg | INTRAVENOUS | Status: DC
  Filled 2020-04-06 (×3): qty 2

## 2020-04-06 MED ORDER — CHLORHEXIDINE GLUCONATE 4 % EX LIQD: 4.0000 "application " | Freq: Once | CUTANEOUS | Status: DC

## 2020-04-06 MED ORDER — ACETAMINOPHEN 500 MG PO TABS
1000.0000 mg | ORAL_TABLET | Freq: Once | ORAL | Status: AC
  Administered 2020-04-06: 1000 mg via ORAL
  Filled 2020-04-06: qty 2

## 2020-04-06 MED ORDER — PROPOFOL 10 MG/ML IV BOLUS
INTRAVENOUS | Status: AC
  Filled 2020-04-06: qty 20

## 2020-04-06 MED ORDER — CHLORHEXIDINE GLUCONATE 0.12 % MT SOLN: 15.0000 mL | Freq: Once | OROMUCOSAL | Status: AC

## 2020-04-06 MED ORDER — SODIUM CHLORIDE 0.9% IV SOLUTION: Freq: Once | INTRAVENOUS | Status: DC

## 2020-04-06 MED ORDER — POVIDONE-IODINE 10 % EX SWAB
2.0000 "application " | Freq: Once | CUTANEOUS | Status: AC
  Administered 2020-04-06: 2 via TOPICAL

## 2020-04-06 SURGICAL SUPPLY — 41 items
BAG BANDED W/RUBBER/TAPE 36X54 (MISCELLANEOUS) IMPLANT
BAG DECANTER FOR FLEXI CONT (MISCELLANEOUS) IMPLANT
BAG EQP BAND 135X91 W/RBR TAPE (MISCELLANEOUS)
BLADE CLIPPER SURG (BLADE) IMPLANT
BLADE OSCILLATING /SAGITTAL (BLADE) IMPLANT
BLADE STERNUM SYSTEM 6 (BLADE) IMPLANT
BNDG ADH 5X4 AIR PERM ELC (GAUZE/BANDAGES/DRESSINGS) ×1
BNDG COHESIVE 4X5 WHT NS (GAUZE/BANDAGES/DRESSINGS) ×3 IMPLANT
CANISTER SUCT 3000ML PPV (MISCELLANEOUS) ×3 IMPLANT
COVER BACK TABLE 60X90IN (DRAPES) ×3 IMPLANT
COVER WAND RF STERILE (DRAPES) ×3 IMPLANT
DRAPE C-ARM 42X72 X-RAY (DRAPES) IMPLANT
DRAPE CARDIOVASCULAR INCISE (DRAPES) ×2
DRAPE HALF SHEET 40X57 (DRAPES) ×6 IMPLANT
DRAPE INCISE IOBAN 66X45 STRL (DRAPES) IMPLANT
DRAPE SRG 135X102X78XABS (DRAPES) ×2 IMPLANT
ELECT REM PT RETURN 9FT ADLT (ELECTROSURGICAL) ×4
ELECTRODE REM PT RTRN 9FT ADLT (ELECTROSURGICAL) ×4 IMPLANT
FELT TEFLON 1X6 (MISCELLANEOUS) IMPLANT
GAUZE 4X4 16PLY RFD (DISPOSABLE) IMPLANT
GAUZE SPONGE 4X4 12PLY STRL (GAUZE/BANDAGES/DRESSINGS) ×3 IMPLANT
GLOVE BIOGEL PI IND STRL 7.5 (GLOVE) ×2 IMPLANT
GLOVE BIOGEL PI INDICATOR 7.5 (GLOVE) ×1
GLOVE ECLIPSE 8.0 STRL XLNG CF (GLOVE) ×3 IMPLANT
GOWN STRL REUS W/ TWL LRG LVL3 (GOWN DISPOSABLE) ×2 IMPLANT
GOWN STRL REUS W/ TWL XL LVL3 (GOWN DISPOSABLE) ×2 IMPLANT
GOWN STRL REUS W/TWL LRG LVL3 (GOWN DISPOSABLE) ×2
GOWN STRL REUS W/TWL XL LVL3 (GOWN DISPOSABLE) ×2
KIT TURNOVER KIT B (KITS) ×3 IMPLANT
NS IRRIG 1000ML POUR BTL (IV SOLUTION) IMPLANT
PAD ARMBOARD 7.5X6 YLW CONV (MISCELLANEOUS) ×6 IMPLANT
PAD ELECT DEFIB RADIOL ZOLL (MISCELLANEOUS) ×3 IMPLANT
SUT PROLENE 2 0 CT2 30 (SUTURE) IMPLANT
SUT PROLENE 2 0 SH DA (SUTURE) IMPLANT
SUT VIC AB 2-0 CT2 18 VCP726D (SUTURE) IMPLANT
SUT VIC AB 3-0 X1 27 (SUTURE) IMPLANT
TOWEL GREEN STERILE (TOWEL DISPOSABLE) ×6 IMPLANT
TOWEL GREEN STERILE FF (TOWEL DISPOSABLE) ×6 IMPLANT
TRAY FOLEY MTR SLVR 16FR STAT (SET/KITS/TRAYS/PACK) ×3 IMPLANT
TUBE CONNECTING 12X1/4 (SUCTIONS) IMPLANT
YANKAUER SUCT BULB TIP NO VENT (SUCTIONS) IMPLANT

## 2020-04-06 NOTE — Progress Notes (Signed)
Patient's sister [name] made aware of the surgery being postponed to a later date due to an emergency case. Patient AAOx4, in no apparent distress or pain. All access lines removed by Jeanie Cooks., CRNA. The opportunity to ask questions was provided. Pt escorted out his sister's vehicle.

## 2020-04-06 NOTE — Progress Notes (Signed)
EP Attending  I was called that the patient's case was prolonged. There is a level one trauma that is bumping [place] and will prevent Mr. Chagoya from going for his procedure until later tonight (after 7 p.m). He will require more than a C-arm for fluorscopic guidance. We cancel his procedure today and reschedule the patient.   Carleene Overlie Jojo Geving,MD

## 2020-04-06 NOTE — H&P (View-Only) (Signed)
EP Attending  I was called that the patient's case was prolonged. There is a level one trauma that is bumping [place] and will prevent Mr. Lancon from going for his procedure until later tonight (after 7 p.m). He will require more than a C-arm for fluorscopic guidance. We cancel his procedure today and reschedule the patient.   Carleene Overlie Kinser Fellman,MD

## 2020-04-06 NOTE — Progress Notes (Signed)
  Echocardiogram Echocardiogram Transesophageal has been performed.  [name] 04/06/2020, 3:43 PM

## 2020-04-06 NOTE — Anesthesia Preprocedure Evaluation (Addendum)
Anesthesia Evaluation  Patient identified by MRN, date of birth, ID band Patient awake    Reviewed: Allergy & Precautions, NPO status , Patient's Chart, lab work & pertinent test results, reviewed documented beta blocker date and time   History of Anesthesia Complications Negative for: history of anesthetic complications  Airway Mallampati: II  TM Distance: >3 FB Neck ROM: Full    Dental  (+) Partial Upper, Missing, Dental Advisory Given   Pulmonary former smoker,  04/03/2020 SARS coronavirus NEG   breath sounds clear to auscultation       Cardiovascular hypertension, Pt. on medications and Pt. on home beta blockers (-) angina+ CAD, + Cardiac Stents and +CHF  + dysrhythmias Ventricular Tachycardia + Cardiac Defibrillator  Rhythm:Regular Rate:Normal  '18 Stress:  Remote infarct involving the septum.  No reversible ischemia, Marked hypokinesis along the septum.  Left ventricular ejection fraction 43%   Neuro/Psych  Neuromuscular disease (myasthenia: controlled with steroids) negative psych ROS   GI/Hepatic Neg liver ROS, GERD  Medicated and Controlled,  Endo/Other  negative endocrine ROS  Renal/GU negative Renal ROS     Musculoskeletal   Abdominal   Peds  Hematology negative hematology ROS (+)   Anesthesia Other Findings   Reproductive/Obstetrics                            Anesthesia Physical Anesthesia Plan  ASA: IV  Anesthesia Plan: General   Post-op Pain Management:    Induction: Intravenous  PONV Risk Score and Plan: 2 and Ondansetron, Dexamethasone and Treatment may vary due to age or medical condition  Airway Management Planned: Oral ETT  Additional Equipment: Arterial line and TEE  Intra-op Plan:   Post-operative Plan: Extubation in OR  Informed Consent: I have reviewed the patients History and Physical, chart, labs and discussed the procedure including the risks, benefits  and alternatives for the proposed anesthesia with the patient or authorized representative who has indicated his/her understanding and acceptance.     Dental advisory given  Plan Discussed with: CRNA and Surgeon  Anesthesia Plan Comments:        Anesthesia Quick Evaluation

## 2020-04-06 NOTE — Anesthesia Procedure Notes (Signed)
Arterial Line Insertion Start/End4/04/2020 1:53 PM, 04/06/2020 1:53 PM Performed by: Renato Shin, CRNA, CRNA  Patient location: Pre-op. Preanesthetic checklist: patient identified, IV checked, site marked, risks and benefits discussed, surgical consent, monitors and equipment checked, pre-op evaluation, timeout performed and anesthesia consent Lidocaine 1% used for infiltration Right, radial was placed Catheter size: 20 G Hand hygiene performed , maximum sterile barriers used  and Seldinger technique used Allen's test indicative of satisfactory collateral circulation Attempts: 1 Procedure performed without using ultrasound guided technique. Following insertion, dressing applied and Biopatch. Post procedure assessment: normal  Patient tolerated the procedure well with no immediate complications.

## 2020-04-07 ENCOUNTER — Telehealth: Payer: Self-pay

## 2020-04-07 LAB — POCT ACTIVATED CLOTTING TIME: Activated Clotting Time: 142 seconds

## 2020-04-07 NOTE — Telephone Encounter (Signed)
Outreach made to Pt.  Advised procedure rescheduled for 04/09/20 at 3:00 pm.

## 2020-04-07 NOTE — Telephone Encounter (Signed)
Left message for Pt requesting call back to confirm procedure 04/09/20

## 2020-04-08 NOTE — Telephone Encounter (Signed)
Spoke with Pt's sister per DPR.  Per sister Pt is aware of arrival time, she is his driver.

## 2020-04-08 NOTE — Telephone Encounter (Signed)
Left message to return call to confirm Pt understands arrival time for 04/09/20 (12:00 pm)  Also attempted to call all contacts on DPR with no answer.

## 2020-04-08 NOTE — Progress Notes (Addendum)
Unable to reach message.  Left voicemail with arrival time and instructions.   EKG: 03/17/20 CXR: 07/08/14 ECHO: 07/19/19 Stress Test: 02/04/20 Cardiac Cath:   Fasting Blood Sugar- na Checks Blood Sugar_na__ times a day  Covid test DOS  Anesthesia Review:  Yes, ICD.  Jeneen Rinks, [place] aware  Patient denies shortness of breath, fever, cough, and chest pain at PAT appointment.  Patient verbalized understanding of instructions provided today at the PAT appointment.  Patient asked to review instructions at home and day of surgery.

## 2020-04-09 ENCOUNTER — Inpatient Hospital Stay (HOSPITAL_COMMUNITY): Payer: Medicare Other | Admitting: Physician Assistant

## 2020-04-09 ENCOUNTER — Other Ambulatory Visit: Payer: Self-pay

## 2020-04-09 ENCOUNTER — Inpatient Hospital Stay (HOSPITAL_COMMUNITY): Payer: Medicare Other

## 2020-04-09 ENCOUNTER — Encounter (HOSPITAL_COMMUNITY)
Admission: RE | Disposition: A | Discharge status: Home / Self Care | Payer: Self-pay | Source: Ambulatory Visit | Attending: Internal Medicine

## 2020-04-09 ENCOUNTER — Encounter (HOSPITAL_COMMUNITY): Payer: Self-pay | Admitting: Internal Medicine

## 2020-04-09 ENCOUNTER — Inpatient Hospital Stay (HOSPITAL_COMMUNITY)
Admission: RE | Admit: 2020-04-09 | Discharge: 2020-04-10 | DRG: 227 | Disposition: A | Discharge status: Home / Self Care | Payer: Medicare Other | Source: Ambulatory Visit | Attending: Internal Medicine | Admitting: Internal Medicine

## 2020-04-09 DIAGNOSIS — Z20822 Contact with and (suspected) exposure to covid-19: Secondary | ICD-10-CM | POA: Diagnosis present

## 2020-04-09 DIAGNOSIS — I5022 Chronic systolic (congestive) heart failure: Secondary | ICD-10-CM | POA: Diagnosis not present

## 2020-04-09 DIAGNOSIS — Z95810 Presence of automatic (implantable) cardiac defibrillator: Secondary | ICD-10-CM

## 2020-04-09 DIAGNOSIS — I447 Left bundle-branch block, unspecified: Secondary | ICD-10-CM | POA: Diagnosis not present

## 2020-04-09 DIAGNOSIS — I428 Other cardiomyopathies: Secondary | ICD-10-CM | POA: Diagnosis not present

## 2020-04-09 DIAGNOSIS — Z4502 Encounter for adjustment and management of automatic implantable cardiac defibrillator: Secondary | ICD-10-CM | POA: Diagnosis not present

## 2020-04-09 DIAGNOSIS — Z888 Allergy status to other drugs, medicaments and biological substances status: Secondary | ICD-10-CM | POA: Diagnosis not present

## 2020-04-09 DIAGNOSIS — I4891 Unspecified atrial fibrillation: Secondary | ICD-10-CM

## 2020-04-09 DIAGNOSIS — K219 Gastro-esophageal reflux disease without esophagitis: Secondary | ICD-10-CM | POA: Diagnosis present

## 2020-04-09 DIAGNOSIS — T82120A Displacement of cardiac electrode, initial encounter: Secondary | ICD-10-CM | POA: Diagnosis not present

## 2020-04-09 DIAGNOSIS — Z79899 Other long term (current) drug therapy: Secondary | ICD-10-CM | POA: Diagnosis not present

## 2020-04-09 DIAGNOSIS — I442 Atrioventricular block, complete: Secondary | ICD-10-CM | POA: Diagnosis not present

## 2020-04-09 DIAGNOSIS — Z87891 Personal history of nicotine dependence: Secondary | ICD-10-CM

## 2020-04-09 DIAGNOSIS — Z7982 Long term (current) use of aspirin: Secondary | ICD-10-CM

## 2020-04-09 DIAGNOSIS — G709 Myoneural disorder, unspecified: Secondary | ICD-10-CM | POA: Diagnosis present

## 2020-04-09 DIAGNOSIS — I110 Hypertensive heart disease with heart failure: Secondary | ICD-10-CM | POA: Diagnosis not present

## 2020-04-09 HISTORY — PX: IMPLANTABLE CARDIOVERTER DEFIBRILLATOR (ICD) GENERATOR CHANGE: SHX5469

## 2020-04-09 HISTORY — DX: Essential (primary) hypertension: I10

## 2020-04-09 LAB — ECHO INTRAOPERATIVE TEE
Height: 68 in
Weight: 2976 oz

## 2020-04-09 LAB — SARS CORONAVIRUS 2 BY RT PCR (HOSPITAL ORDER, PERFORMED IN ~~LOC~~ HOSPITAL LAB): SARS Coronavirus 2: NEGATIVE

## 2020-04-09 LAB — PREPARE RBC (CROSSMATCH)

## 2020-04-09 SURGERY — ICD GENERATOR CHANGE
Anesthesia: General

## 2020-04-09 MED ORDER — PHENYLEPHRINE HCL-NACL 10-0.9 MG/250ML-% IV SOLN: 0.0000 ug/min | INTRAVENOUS | Status: DC

## 2020-04-09 MED ORDER — SODIUM CHLORIDE 0.9 % IV SOLN: INTRAVENOUS | Status: DC

## 2020-04-09 MED ORDER — IODIXANOL 320 MG/ML IV SOLN
INTRAVENOUS | Status: DC | PRN
  Administered 2020-04-09: 22 mL

## 2020-04-09 MED ORDER — CEFAZOLIN SODIUM-DEXTROSE 2-4 GM/100ML-% IV SOLN
2.0000 g | INTRAVENOUS | Status: AC
  Administered 2020-04-09: 2 g via INTRAVENOUS
  Filled 2020-04-09: qty 100

## 2020-04-09 MED ORDER — LACTATED RINGERS IV SOLN: INTRAVENOUS | Status: DC | PRN

## 2020-04-09 MED ORDER — ONDANSETRON HCL 4 MG/2ML IJ SOLN: 4.0000 mg | Freq: Once | INTRAMUSCULAR | Status: DC | PRN

## 2020-04-09 MED ORDER — SODIUM CHLORIDE 0.9 % IV SOLN: 250.0000 mL | INTRAVENOUS | Status: DC

## 2020-04-09 MED ORDER — SACUBITRIL-VALSARTAN 49-51 MG PO TABS
1.0000 | ORAL_TABLET | Freq: Two times a day (BID) | ORAL | Status: DC
  Administered 2020-04-09 – 2020-04-10 (×2): 1 via ORAL
  Filled 2020-04-09 (×3): qty 1

## 2020-04-09 MED ORDER — HYDROCODONE-ACETAMINOPHEN 5-325 MG PO TABS
1.0000 | ORAL_TABLET | Freq: Four times a day (QID) | ORAL | Status: DC | PRN
  Administered 2020-04-09 – 2020-04-10 (×2): 1 via ORAL
  Filled 2020-04-09 (×2): qty 1

## 2020-04-09 MED ORDER — FENTANYL CITRATE (PF) 100 MCG/2ML IJ SOLN
25.0000 ug | INTRAMUSCULAR | Status: DC | PRN
  Administered 2020-04-09: 25 ug via INTRAVENOUS

## 2020-04-09 MED ORDER — SOTALOL HCL 120 MG PO TABS
120.0000 mg | ORAL_TABLET | Freq: Two times a day (BID) | ORAL | Status: DC
  Administered 2020-04-09 – 2020-04-10 (×2): 120 mg via ORAL
  Filled 2020-04-09 (×3): qty 1

## 2020-04-09 MED ORDER — CHLORHEXIDINE GLUCONATE 4 % EX LIQD: 4.0000 "application " | Freq: Once | CUTANEOUS | Status: DC

## 2020-04-09 MED ORDER — CEFAZOLIN SODIUM-DEXTROSE 1-4 GM/50ML-% IV SOLN
1.0000 g | Freq: Four times a day (QID) | INTRAVENOUS | Status: AC
  Administered 2020-04-09 – 2020-04-10 (×3): 1 g via INTRAVENOUS
  Filled 2020-04-09 (×4): qty 50

## 2020-04-09 MED ORDER — SODIUM CHLORIDE 0.9 % IV SOLN
INTRAVENOUS | Status: AC
  Filled 2020-04-09: qty 1.2

## 2020-04-09 MED ORDER — FENTANYL CITRATE (PF) 250 MCG/5ML IJ SOLN
INTRAMUSCULAR | Status: AC
  Filled 2020-04-09: qty 5

## 2020-04-09 MED ORDER — ONDANSETRON HCL 4 MG/2ML IJ SOLN
INTRAMUSCULAR | Status: DC | PRN
  Administered 2020-04-09: 4 mg via INTRAVENOUS

## 2020-04-09 MED ORDER — FUROSEMIDE 40 MG PO TABS: 60.0000 mg | ORAL_TABLET | Freq: Every day | ORAL | Status: DC

## 2020-04-09 MED ORDER — VASOPRESSIN 20 UNIT/ML IV SOLN
INTRAVENOUS | Status: DC | PRN
  Administered 2020-04-09: 1 [IU] via INTRAVENOUS
  Administered 2020-04-09: 2 [IU] via INTRAVENOUS

## 2020-04-09 MED ORDER — SODIUM CHLORIDE 0.9 % IV SOLN: 80.0000 mg | INTRAVENOUS | Status: DC

## 2020-04-09 MED ORDER — PROPOFOL 10 MG/ML IV BOLUS
INTRAVENOUS | Status: AC
  Filled 2020-04-09: qty 20

## 2020-04-09 MED ORDER — PROPOFOL 10 MG/ML IV BOLUS
INTRAVENOUS | Status: DC | PRN
  Administered 2020-04-09: 150 mg via INTRAVENOUS

## 2020-04-09 MED ORDER — LIDOCAINE 2% (20 MG/ML) 5 ML SYRINGE
INTRAMUSCULAR | Status: DC | PRN
  Administered 2020-04-09: 100 mg via INTRAVENOUS

## 2020-04-09 MED ORDER — SODIUM CHLORIDE 0.9% IV SOLUTION: Freq: Once | INTRAVENOUS | Status: DC

## 2020-04-09 MED ORDER — VASOPRESSIN 20 UNIT/ML IV SOLN
INTRAVENOUS | Status: AC
  Filled 2020-04-09: qty 1

## 2020-04-09 MED ORDER — ALBUMIN HUMAN 5 % IV SOLN: INTRAVENOUS | Status: DC | PRN

## 2020-04-09 MED ORDER — ACETAMINOPHEN 500 MG PO TABS
1000.0000 mg | ORAL_TABLET | Freq: Once | ORAL | Status: AC
  Administered 2020-04-09: 1000 mg via ORAL
  Filled 2020-04-09: qty 2

## 2020-04-09 MED ORDER — ACETAMINOPHEN 325 MG PO TABS
325.0000 mg | ORAL_TABLET | ORAL | Status: DC | PRN
  Administered 2020-04-10: 650 mg via ORAL
  Filled 2020-04-09: qty 2

## 2020-04-09 MED ORDER — ONDANSETRON HCL 4 MG/2ML IJ SOLN: 4.0000 mg | Freq: Four times a day (QID) | INTRAMUSCULAR | Status: DC | PRN

## 2020-04-09 MED ORDER — LIDOCAINE HCL 1 % IJ SOLN
INTRAMUSCULAR | Status: AC
  Filled 2020-04-09: qty 20

## 2020-04-09 MED ORDER — DEXAMETHASONE SODIUM PHOSPHATE 10 MG/ML IJ SOLN
INTRAMUSCULAR | Status: DC | PRN
  Administered 2020-04-09: 10 mg via INTRAVENOUS

## 2020-04-09 MED ORDER — SUGAMMADEX SODIUM 200 MG/2ML IV SOLN
INTRAVENOUS | Status: DC | PRN
  Administered 2020-04-09: 200 mg via INTRAVENOUS

## 2020-04-09 MED ORDER — 0.9 % SODIUM CHLORIDE (POUR BTL) OPTIME
TOPICAL | Status: DC | PRN
  Administered 2020-04-09: 1000 mL

## 2020-04-09 MED ORDER — EPINEPHRINE HCL 5 MG/250ML IV SOLN IN NS
0.5000 ug/min | INTRAVENOUS | Status: DC
  Filled 2020-04-09: qty 250

## 2020-04-09 MED ORDER — SODIUM CHLORIDE 0.9 % IV SOLN
INTRAVENOUS | Status: AC
  Filled 2020-04-09 (×2): qty 2

## 2020-04-09 MED ORDER — PHENYLEPHRINE HCL-NACL 10-0.9 MG/250ML-% IV SOLN
0.0000 ug/min | INTRAVENOUS | Status: DC
  Filled 2020-04-09: qty 250

## 2020-04-09 MED ORDER — CARVEDILOL 3.125 MG PO TABS
3.1250 mg | ORAL_TABLET | Freq: Two times a day (BID) | ORAL | Status: DC
  Administered 2020-04-10: 3.125 mg via ORAL
  Filled 2020-04-09: qty 1

## 2020-04-09 MED ORDER — PHENYLEPHRINE HCL-NACL 10-0.9 MG/250ML-% IV SOLN
INTRAVENOUS | Status: DC | PRN
  Administered 2020-04-09: 100 ug/min via INTRAVENOUS

## 2020-04-09 MED ORDER — POVIDONE-IODINE 10 % EX SWAB
2.0000 "application " | Freq: Once | CUTANEOUS | Status: AC
  Administered 2020-04-09: 2 via TOPICAL

## 2020-04-09 MED ORDER — FENTANYL CITRATE (PF) 100 MCG/2ML IJ SOLN
INTRAMUSCULAR | Status: AC
  Filled 2020-04-09: qty 2

## 2020-04-09 MED ORDER — SODIUM CHLORIDE 0.9 % IV SOLN
INTRAVENOUS | Status: AC
  Administered 2020-04-09: 1500 mL
  Filled 2020-04-09: qty 2

## 2020-04-09 MED ORDER — PHENYLEPHRINE 40 MCG/ML (10ML) SYRINGE FOR IV PUSH (FOR BLOOD PRESSURE SUPPORT)
PREFILLED_SYRINGE | INTRAVENOUS | Status: DC | PRN
  Administered 2020-04-09 (×2): 120 ug via INTRAVENOUS
  Administered 2020-04-09 (×2): 200 ug via INTRAVENOUS

## 2020-04-09 MED ORDER — ROCURONIUM BROMIDE 10 MG/ML (PF) SYRINGE
PREFILLED_SYRINGE | INTRAVENOUS | Status: DC | PRN
  Administered 2020-04-09: 30 mg via INTRAVENOUS
  Administered 2020-04-09: 20 mg via INTRAVENOUS
  Administered 2020-04-09: 70 mg via INTRAVENOUS

## 2020-04-09 MED ORDER — PREDNISONE 5 MG PO TABS
5.0000 mg | ORAL_TABLET | ORAL | Status: DC
  Administered 2020-04-10: 5 mg via ORAL
  Filled 2020-04-09: qty 1

## 2020-04-09 MED ORDER — EPINEPHRINE 1 MG/10ML IJ SOSY
PREFILLED_SYRINGE | INTRAMUSCULAR | Status: DC | PRN
  Administered 2020-04-09: .01 mg via INTRAVENOUS

## 2020-04-09 MED ORDER — FENTANYL CITRATE (PF) 250 MCG/5ML IJ SOLN
INTRAMUSCULAR | Status: DC | PRN
  Administered 2020-04-09: 25 ug via INTRAVENOUS
  Administered 2020-04-09: 100 ug via INTRAVENOUS

## 2020-04-09 MED ORDER — NOREPINEPHRINE 4 MG/250ML-% IV SOLN
0.0000 ug/min | INTRAVENOUS | Status: AC
  Administered 2020-04-09: 2 ug/min via INTRAVENOUS
  Filled 2020-04-09: qty 250

## 2020-04-09 SURGICAL SUPPLY — 79 items
APL SKNCLS STERI-STRIP NONHPOA (GAUZE/BANDAGES/DRESSINGS) ×1
BAG BANDED W/RUBBER/TAPE 36X54 (MISCELLANEOUS) ×2 IMPLANT
BAG DECANTER FOR FLEXI CONT (MISCELLANEOUS) ×2 IMPLANT
BAG EQP BAND 135X91 W/RBR TAPE (MISCELLANEOUS) ×1
BENZOIN TINCTURE PRP APPL 2/3 (GAUZE/BANDAGES/DRESSINGS) ×2 IMPLANT
BLADE CLIPPER SURG (BLADE) IMPLANT
BLADE OSCILLATING /SAGITTAL (BLADE) ×2 IMPLANT
BLADE STERNUM SYSTEM 6 (BLADE) IMPLANT
BNDG ADH 5X4 AIR PERM ELC (GAUZE/BANDAGES/DRESSINGS) ×1
BNDG COHESIVE 4X5 WHT NS (GAUZE/BANDAGES/DRESSINGS) ×3 IMPLANT
CANISTER SUCT 3000ML PPV (MISCELLANEOUS) ×3 IMPLANT
CATH ATTAIN COM SURV 6250V-MB2 (CATHETERS) ×4 IMPLANT
CATH JOSEPH QUAD ALLRED 6F REP (CATHETERS) ×2 IMPLANT
CATH QUAD COURNAND 5FR (CATHETERS) ×4 IMPLANT
CLOSURE STERI-STRIP 1/2X4 (GAUZE/BANDAGES/DRESSINGS) ×1
CLSR STERI-STRIP ANTIMIC 1/2X4 (GAUZE/BANDAGES/DRESSINGS) ×1 IMPLANT
COIL ONE TIE COMPRESSION (VASCULAR PRODUCTS) ×2 IMPLANT
COVER BACK TABLE 60X90IN (DRAPES) ×3 IMPLANT
COVER WAND RF STERILE (DRAPES) ×1 IMPLANT
DRAPE C-ARM 42X72 X-RAY (DRAPES) IMPLANT
DRAPE CARDIOVASCULAR INCISE (DRAPES) ×3
DRAPE HALF SHEET 40X57 (DRAPES) ×6 IMPLANT
DRAPE INCISE IOBAN 66X45 STRL (DRAPES) IMPLANT
DRAPE SRG 135X102X78XABS (DRAPES) ×1 IMPLANT
DRSG TEGADERM 4X4.75 (GAUZE/BANDAGES/DRESSINGS) ×6 IMPLANT
ELECT REM PT RETURN 9FT ADLT (ELECTROSURGICAL) ×6
ELECTRODE REM PT RTRN 9FT ADLT (ELECTROSURGICAL) ×2 IMPLANT
FELT TEFLON 1X6 (MISCELLANEOUS) IMPLANT
GAUZE 4X4 16PLY RFD (DISPOSABLE) ×8 IMPLANT
GAUZE SPONGE 4X4 12PLY STRL (GAUZE/BANDAGES/DRESSINGS) ×3 IMPLANT
GAUZE SPONGE 4X4 12PLY STRL LF (GAUZE/BANDAGES/DRESSINGS) ×6 IMPLANT
GLOVE BIOGEL PI IND STRL 7.5 (GLOVE) ×1 IMPLANT
GLOVE BIOGEL PI INDICATOR 7.5 (GLOVE) ×2
GLOVE ECLIPSE 8.0 STRL XLNG CF (GLOVE) ×3 IMPLANT
GLOVE SURG UNDER POLY LF SZ6 (GLOVE) ×2 IMPLANT
GLOVE SURG UNDER POLY LF SZ6.5 (GLOVE) ×2 IMPLANT
GOWN STRL REUS W/ TWL LRG LVL3 (GOWN DISPOSABLE) ×1 IMPLANT
GOWN STRL REUS W/ TWL XL LVL3 (GOWN DISPOSABLE) ×1 IMPLANT
GOWN STRL REUS W/TWL LRG LVL3 (GOWN DISPOSABLE) ×6
GOWN STRL REUS W/TWL XL LVL3 (GOWN DISPOSABLE) ×3
GUIDEWIRE ANGLED .035X150CM (WIRE) ×2 IMPLANT
ICD CLARIA MRI DTMA1Q1 (ICD Generator) ×2 IMPLANT
KIT LEAD ACCESSORY 6056 PINCH (KITS) ×2 IMPLANT
KIT TURNOVER KIT B (KITS) ×3 IMPLANT
KIT WRENCH (KITS) ×2 IMPLANT
LEAD ATTAIN PERFORM ST 4398-88 (Lead) ×2 IMPLANT
LEAD CAPSURE NOVUS 5076-52CM (Lead) ×2 IMPLANT
NS IRRIG 1000ML POUR BTL (IV SOLUTION) IMPLANT
PACEMAKER STYLET GRAY (MISCELLANEOUS) ×2 IMPLANT
PAD ARMBOARD 7.5X6 YLW CONV (MISCELLANEOUS) ×6 IMPLANT
PAD ELECT DEFIB RADIOL ZOLL (MISCELLANEOUS) ×3 IMPLANT
POUCH AIGIS-R ANTIBACT ICD (Mesh General) ×3 IMPLANT
POUCH AIGIS-R ANTIBACT ICD LRG (Mesh General) IMPLANT
SHEATH 11 SUB-C ROTATE DILATOR (SHEATH) ×2 IMPLANT
SHEATH 7FR PRELUDE SNAP 25 (SHEATH) ×2 IMPLANT
SHEATH 9.5FR PRELUDE SNAP 13 (SHEATH) ×2 IMPLANT
SHEATH PINNACLE 6F 10CM (SHEATH) ×2 IMPLANT
SHEATH TIGHTRAIL MECH (SHEATH) ×1
SHEATH TIGHTRAIL MECH 11F (SHEATH) ×1 IMPLANT
SLING ARM IMMOBILIZER XL (CAST SUPPLIES) ×2 IMPLANT
SNARE GOOSENECK 15MM (MISCELLANEOUS) ×2 IMPLANT
STYLET LIBERATOR LOCKING (MISCELLANEOUS) ×2 IMPLANT
SUT PROLENE 2 0 CT2 30 (SUTURE) IMPLANT
SUT PROLENE 2 0 SH DA (SUTURE) IMPLANT
SUT SILK  1 MH (SUTURE) ×3
SUT SILK 1 MH (SUTURE) IMPLANT
SUT SILK 1 SH (SUTURE) ×4 IMPLANT
SUT VIC AB 2-0 CT2 18 VCP726D (SUTURE) IMPLANT
SUT VIC AB 2-0 CT2 27 (SUTURE) ×2 IMPLANT
SUT VIC AB 3-0 X1 27 (SUTURE) ×2 IMPLANT
TAPE CLOTH SURG 4X10 WHT LF (GAUZE/BANDAGES/DRESSINGS) ×2 IMPLANT
TOWEL GREEN STERILE (TOWEL DISPOSABLE) ×4 IMPLANT
TOWEL GREEN STERILE FF (TOWEL DISPOSABLE) ×4 IMPLANT
TRAY FOLEY MTR SLVR 16FR STAT (SET/KITS/TRAYS/PACK) ×3 IMPLANT
TUBE CONNECTING 12'X1/4 (SUCTIONS)
TUBE CONNECTING 12X1/4 (SUCTIONS) IMPLANT
WIRE ACUITY WHISPER EDS 4648 (WIRE) ×2 IMPLANT
WIRE HI TORQ VERSACORE-J 145CM (WIRE) ×2 IMPLANT
YANKAUER SUCT BULB TIP NO VENT (SUCTIONS) IMPLANT

## 2020-04-09 NOTE — Anesthesia Preprocedure Evaluation (Addendum)
Anesthesia Evaluation  Patient identified by MRN, date of birth, ID band Patient awake    Reviewed: Allergy & Precautions, NPO status , Patient's Chart, lab work & pertinent test results, reviewed documented beta blocker date and time   Airway Mallampati: II  TM Distance: >3 FB Neck ROM: Full    Dental  (+) Dental Advisory Given, Partial Upper, Poor Dentition, Missing   Pulmonary former smoker,    Pulmonary exam normal breath sounds clear to auscultation       Cardiovascular hypertension, Pt. on home beta blockers and Pt. on medications + CAD and +CHF  Normal cardiovascular exam+ dysrhythmias Ventricular Tachycardia + Cardiac Defibrillator  Rhythm:Regular Rate:Normal  '18 Stress:  Remote infarct involving the septum.  No reversible ischemia, Marked hypokinesis along the septum.  Left ventricular ejection fraction 43%    Neuro/Psych MG s/p thymectomy   Neuromuscular disease    GI/Hepatic Neg liver ROS, GERD  Medicated,  Endo/Other  negative endocrine ROS  Renal/GU negative Renal ROS     Musculoskeletal negative musculoskeletal ROS (+)   Abdominal   Peds  Hematology negative hematology ROS (+)   Anesthesia Other Findings Day of surgery medications reviewed with the patient.  Reproductive/Obstetrics                            Anesthesia Physical Anesthesia Plan  ASA: IV  Anesthesia Plan: General   Post-op Pain Management:    Induction: Intravenous  PONV Risk Score and Plan: 2 and Dexamethasone and Ondansetron  Airway Management Planned: Oral ETT  Additional Equipment: Arterial line and TEE  Intra-op Plan:   Post-operative Plan: Extubation in OR  Informed Consent: I have reviewed the patients History and Physical, chart, labs and discussed the procedure including the risks, benefits and alternatives for the proposed anesthesia with the patient or authorized representative who has  indicated his/her understanding and acceptance.     Dental advisory given  Plan Discussed with: CRNA  Anesthesia Plan Comments: (TEE FOR INTRA-OP MONITORING ONLY)       Anesthesia Quick Evaluation

## 2020-04-09 NOTE — Interval H&P Note (Signed)
History and Physical Interval Note:  04/09/2020 1:45 PM  [name]  has presented today for surgery, with the diagnosis of LEAD FAILURE AND ERI.  The various methods of treatment have been discussed with the patient and family. After consideration of risks, benefits and other options for treatment, the patient has consented to  Procedure(s): MEDTRONIC BIV;ONE GEN CHANGE; EXTRACT RIGHT ATRIAL LEAD; REIMPLANT RIGHT ATRIAL LEAD;ADD LEFT BUNDLE PACING LEAD (N/A) as a surgical intervention.  The patient's history has been reviewed, patient examined, no change in status, stable for surgery.  I have reviewed the patient's chart and labs.  Questions were answered to the patient's satisfaction.     [name]

## 2020-04-09 NOTE — Anesthesia Procedure Notes (Signed)
Procedure Name: Intubation Date/Time: 04/09/2020 2:17 PM Performed by: Dorthea Cove, CRNA Pre-anesthesia Checklist: Patient identified, Emergency Drugs available, Suction available and Patient being monitored Patient Re-evaluated:Patient Re-evaluated prior to induction Oxygen Delivery Method: Circle system utilized Preoxygenation: Pre-oxygenation with 100% oxygen Induction Type: IV induction Ventilation: Mask ventilation without difficulty Laryngoscope Size: Mac and 4 Grade View: Grade I Tube type: Oral Tube size: 7.5 mm Number of attempts: 1 Airway Equipment and Method: Stylet and Oral airway Placement Confirmation: ETT inserted through vocal cords under direct vision,  positive ETCO2 and breath sounds checked- equal and bilateral Secured at: 23 cm Tube secured with: Tape Dental Injury: Teeth and Oropharynx as per pre-operative assessment

## 2020-04-09 NOTE — Progress Notes (Signed)
  Echocardiogram Echocardiogram Transesophageal has been performed.  [name] 04/09/2020, 4:04 PM

## 2020-04-09 NOTE — Transfer of Care (Signed)
Immediate Anesthesia Transfer of Care Note  Patient: [name]  Procedure(s) Performed: MEDTRONIC BIV;ONE GEN CHANGE; EXTRACT RIGHT ATRIAL LEAD; REIMPLANT RIGHT ATRIAL LEAD;ADD LEFT BUNDLE PACING LEAD (N/A )  Patient Location: PACU  Anesthesia Type:General  Level of Consciousness: awake, alert  and oriented  Airway & Oxygen Therapy: Patient Spontanous Breathing and Patient connected to nasal cannula oxygen  Post-op Assessment: Report given to RN and Post -op Vital signs reviewed and stable  Post vital signs: Reviewed and stable  Last Vitals:  Vitals Value Taken Time  BP 120/68 04/09/20 1747  Temp    Pulse 74 04/09/20 1749  Resp 26 04/09/20 1749  SpO2 98 % 04/09/20 1749  Vitals shown include unvalidated device data.  Last Pain:  Vitals:   04/09/20 1228  TempSrc:   PainSc: 0-No pain         Complications: No complications documented.

## 2020-04-09 NOTE — CV Procedure (Signed)
EP procedure note  Procedure performed: Extraction of an atrial pacing lead, removal of a previously implanted BiV ICD, insertion of a new atrial lead, a new left ventricular pacing lead, a new biventricular ICD in a patient with complete heart block and chronic systolic heart failure  Preoperative diagnosis: Chronic systolic heart failure, class III, pacing induced left bundle branch, complete heart block, status post prior ICD  Postoperative diagnosis: Same as preoperative diagnosis  Description of the procedure: After informed consent was obtained, the patient was taken to the operating room in the fasting state.  The anesthesia service was utilized to provide general endotracheal anesthesia.  A 6 French sheath was inserted percutaneously in the right femoral vein.  A 5 cm incision was carried out in the right infraclavicular region.  Electrocautery was utilized to dissect down to the ICD pocket and the generator was removed with gentle traction.  Electrocautery was utilized to free up the dense fibrous scar tissue around the atrial lead.  The sewing sleeve was removed.  A stylette was inserted into the body of the lead and the helix retracted.  The helix did not move.  The stylette was removed and the lead was cut.  A Cook liberator locking stylette was inserted into the body of the lead and locked in place.  A Cook 1 tie suture was placed on the proximal portion of the lead.  A Spectranetics short extraction sheath we will schedule him for was inserted over the body of the atrial lead evaluation and advanced into the area of the junction of the subclavian vein and the superior vena cava.  There is very dense fibrous scar tissue in this location and the atrial lead despite being in place for almost 13 years dislodged from the atrium and was withdrawn back to the junction of the superior vena cava and the right subclavian vein.  At this point, additional femoral venous access was obtained with a catheter  in the left femoral vein.  A 6 French owls eye snare, 15 mm, was advanced under fluoroscopic guidance into the right atrium and on up into the superior vena cava.  The dislodged atrial lead was snared and pulled back down into the right atrium.  The Spectranetics longer 11 French mechanical extraction sheath was then advanced over the lead and into the right atrium.  The sheath was maintained in position in the right atrium and the atrial lead was removed with no hemodynamic sequelae.  2 Woolley wires were advanced into the right atrium.  A 7 French long sheath was inserted over one of the wires into the superior vena cava and a Medtronic 5076 active-fixation pacing lead was advanced into the right atrium.  The serial number was [id number].  At this point, the 9-1/2 [place] sheath along with the Medtronic guiding catheter was advanced over the Spencer wire and into the right atrium.  It took approximately 30 minutes to cannulate the coronary sinus.  Venography was carried out.  There is a acceptable posterior vein for left ventricular pacing.  An 014 angioplasty guidewire was advanced into the posterior vein and the Medtronic 4398 88 cm quadripolar left ventricular pacing lead, serial number [id number], was advanced under fluoroscopic guidance into the posterior vein of the left ventricle.  The guiding catheter was removed.  The atrial lead was placed on the lateral wall of the right atrium, and the P waves measured 3 mV.  The pacing impedance was 450 ohms and the atrial pacing  threshold was 0.5 V at 0.4 ms.  The left ventricular pacing lead was 1 V at 0.4 ms with a pacing impedance of 700 ohms.  The leads were freed up from the sheaths in the usual manner.  The leads were secured to the fascia with silk suture.  The sewing sleeves were secured with silk suture.  The pocket was irrigated with antibiotic irrigation.  The Medtronic biventricular ICD, serial number [id number], was connected to the old ICD lead and  the new atrial and left ventricular pacing leads.  A Medtronic antibiotic pouch was placed over the defibrillator and pacing leads and placed in the subcutaneous pocket.  The pocket was irrigated with antibiotic irrigation.  The incision was closed with 2 layers of Vicryl suture.  Benzoin and Steri-Strips were pain on the skin and a pressure dressing was applied.  The 6 French sheath that had been placed in the right and left femoral veins were removed and pressure held and hemostasis assured and the patient was returned to the recovery area in stable condition.  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  Cristopher Peru, MD

## 2020-04-09 NOTE — Telephone Encounter (Signed)
Patient call back to say he will be at [facility] at 12 today

## 2020-04-09 NOTE — Anesthesia Postprocedure Evaluation (Signed)
Anesthesia Post Note  Patient: CHRISTAPHER GILLIAN  Procedure(s) Performed: MEDTRONIC BIV;ONE GEN CHANGE; EXTRACT RIGHT ATRIAL LEAD; REIMPLANT RIGHT ATRIAL LEAD;ADD LEFT BUNDLE PACING LEAD (N/A )     Patient location during evaluation: PACU Anesthesia Type: General Level of consciousness: awake Pain management: pain level controlled Vital Signs Assessment: post-procedure vital signs reviewed and stable Respiratory status: spontaneous breathing, nonlabored ventilation, respiratory function stable and patient connected to nasal cannula oxygen Cardiovascular status: blood pressure returned to baseline and stable Postop Assessment: no apparent nausea or vomiting Anesthetic complications: no   No complications documented.  Last Vitals:  Vitals:   04/09/20 1947 04/09/20 2034  BP: 110/73 111/74  Pulse: 83 86  Resp: 19 20  Temp:  36.6 C  SpO2: 93% 98%    Last Pain:  Vitals:   04/09/20 2039  TempSrc:   PainSc: 5                  Ansen Sayegh P Kenidi Elenbaas

## 2020-04-10 ENCOUNTER — Inpatient Hospital Stay (HOSPITAL_COMMUNITY): Payer: Medicare Other

## 2020-04-10 DIAGNOSIS — I442 Atrioventricular block, complete: Secondary | ICD-10-CM | POA: Diagnosis not present

## 2020-04-10 DIAGNOSIS — I428 Other cardiomyopathies: Secondary | ICD-10-CM | POA: Diagnosis not present

## 2020-04-10 DIAGNOSIS — I5022 Chronic systolic (congestive) heart failure: Secondary | ICD-10-CM | POA: Diagnosis not present

## 2020-04-10 DIAGNOSIS — Z20822 Contact with and (suspected) exposure to covid-19: Secondary | ICD-10-CM | POA: Diagnosis not present

## 2020-04-10 LAB — TYPE AND SCREEN
ABO/RH(D): O POS
Antibody Screen: NEGATIVE
Unit division: 0
Unit division: 0

## 2020-04-10 LAB — BPAM RBC
Blood Product Expiration Date: 202204262359
Blood Product Expiration Date: 202204262359
Unit Type and Rh: 5100
Unit Type and Rh: 5100

## 2020-04-10 MED ORDER — ACETAMINOPHEN 325 MG PO TABS: 325.0000 mg | ORAL_TABLET | ORAL | Status: AC | PRN

## 2020-04-10 MED ORDER — FUROSEMIDE 10 MG/ML IJ SOLN
60.0000 mg | Freq: Once | INTRAMUSCULAR | Status: AC
  Administered 2020-04-10: 60 mg via INTRAVENOUS
  Filled 2020-04-10: qty 6

## 2020-04-10 MED ORDER — ASPIRIN EC 81 MG PO TBEC: 81.0000 mg | DELAYED_RELEASE_TABLET | Freq: Every day | ORAL | 11 refills | Status: AC

## 2020-04-10 MED ORDER — FUROSEMIDE 40 MG PO TABS: 60.0000 mg | ORAL_TABLET | Freq: Every day | ORAL | Status: DC

## 2020-04-10 NOTE — Discharge Instructions (Signed)
After Your ICD (Implantable Cardiac Defibrillator)   . You have a Medtronic ICD  ACTIVITY . Do not lift your arm above shoulder height for 1 week after your procedure. After 7 days, you may progress as below.  . You should remove your sling 24 hours after your procedure, unless otherwise instructed by your provider.     Friday April 17, 2020  Saturday April 18, 2020 Sunday April 19, 2020 Monday April 20, 2020   . Do not lift, push, pull, or carry anything over 10 pounds with the affected arm until 6 weeks (Friday May 22, 2020 ) after your procedure.   . Do NOT DRIVE until you have been seen for your wound check. You may drive afterward if there are no issues at that appointment.    . Ask your healthcare provider when you can go back to work   INCISION/Dressing . If you are on a blood thinner such as Coumadin, Xarelto, Eliquis, Plavix, or Pradaxa please confirm with your provider when this should be resumed. Do not resume your ASPIRIN until after your wound check.  . Monitor your defibrillator site for redness, swelling, and drainage. Call the device clinic at [phone] if you experience these symptoms or fever/chills.  . If your incision is sealed with Steri-strips or staples, you may shower 10 days after your procedure or when told by your provider. Do not remove the steri-strips or let the shower hit directly on your site. You may wash around your site with soap and water.    Marland Kitchen Avoid lotions, ointments, or perfumes over your incision until it is well-healed.  . You may use a hot tub or a pool AFTER your wound check appointment if the incision is completely closed.  . Your ICD is designed to protect you from life threatening heart rhythms. Because of this, you may receive a shock.   o 1 shock with no symptoms:  Call the office during business hours. o 1 shock with symptoms (chest pain, chest pressure, dizziness, lightheadedness, shortness of breath, overall feeling unwell):   Call 911. o If you experience 2 or more shocks in 24 hours:  Call 911. o If you receive a shock, you should not drive for 6 months per the [place] DMV IF you receive appropriate therapy from your ICD.   . ICD Alerts:  Some alerts are vibratory and others beep. These are NOT emergencies. Please call our office to let us know. If this occurs at night or on weekends, it can wait until the next business day. Send a remote transmission.  . If your device is capable of reading fluid status (for heart failure), you will be offered monthly monitoring to review this with you.   DEVICE MANAGEMENT . Remote monitoring is used to monitor your ICD from home. This monitoring is scheduled every 91 days by our office. It allows [place] to keep an eye on the functioning of your device to ensure it is working properly. You will routinely see your Electrophysiologist annually (more often if necessary).   . You should receive your ID card for your new device in 4-8 weeks. Keep this card with you at all times once received. Consider wearing a medical alert bracelet or necklace.  . Your ICD  may be MRI compatible. This will be discussed at your next office visit/wound check.  You should avoid contact with strong electric or magnetic fields.    Do not use amateur (ham) radio equipment or electric (arc) welding torches.  MP3 player headphones with magnets should not be used. Some devices are safe to use if held at least 12 inches (30 cm) from your defibrillator. These include power tools, lawn mowers, and speakers. If you are unsure if something is safe to use, ask your health care provider.   When using your cell phone, hold it to the ear that is on the opposite side from the defibrillator. Do not leave your cell phone in a pocket over the defibrillator.   You may safely use electric blankets, heating pads, computers, and microwave ovens.  Call the office right away if:  You have chest pain.  You feel more than one  shock.  You feel more short of breath than you have felt before.  You feel more light-headed than you have felt before.  Your incision starts to open up.  This information is not intended to replace advice given to you by your health care provider. Make sure you discuss any questions you have with your health care provider.    Heart-Healthy Eating Plan Heart-healthy meal planning includes:  Eating less unhealthy fats.  Eating more healthy fats.  Making other changes in your diet. Talk with your doctor or a diet specialist (dietitian) to create an eating plan that is right for you. What is my plan? Your doctor may recommend an eating plan that includes:  Total fat: ______% or less of total calories a day.  Saturated fat: ______% or less of total calories a day.  Cholesterol: less than _________mg a day. What are tips for following this plan? Cooking Avoid frying your food. Try to bake, boil, grill, or broil it instead. You can also reduce fat by:  Removing the skin from poultry.  Removing all visible fats from meats.  Steaming vegetables in water or broth. Meal planning  At meals, divide your plate into four equal parts: ? Fill one-half of your plate with vegetables and green salads. ? Fill one-fourth of your plate with whole grains. ? Fill one-fourth of your plate with lean protein foods.  Eat 4-5 servings of vegetables per day. A serving of vegetables is: ? 1 cup of raw or cooked vegetables. ? 2 cups of raw leafy greens.  Eat 4-5 servings of fruit per day. A serving of fruit is: ? 1 medium whole fruit. ?  cup of dried fruit. ?  cup of fresh, frozen, or canned fruit. ?  cup of 100% fruit juice.  Eat more foods that have soluble fiber. These are apples, broccoli, carrots, beans, peas, and barley. Try to get 20-30 g of fiber per day.  Eat 4-5 servings of nuts, legumes, and seeds per week: ? 1 serving of dried beans or legumes equals  cup after being  cooked. ? 1 serving of nuts is  cup. ? 1 serving of seeds equals 1 tablespoon.   General information  Eat more home-cooked food. Eat less restaurant, buffet, and fast food.  Limit or avoid alcohol.  Limit foods that are high in starch and sugar.  Avoid fried foods.  Lose weight if you are overweight.  Keep track of how much salt (sodium) you eat. This is important if you have high blood pressure. Ask your doctor to tell you more about this.  Try to add vegetarian meals each week. Fats  Choose healthy fats. These include olive oil and canola oil, flaxseeds, walnuts, almonds, and seeds.  Eat more omega-3 fats. These include salmon, mackerel, sardines, tuna, flaxseed oil, and ground flaxseeds. Try  to eat fish at least 2 times each week.  Check food labels. Avoid foods with trans fats or high amounts of saturated fat.  Limit saturated fats. ? These are often found in animal products, such as meats, butter, and cream. ? These are also found in plant foods, such as palm oil, palm kernel oil, and coconut oil.  Avoid foods with partially hydrogenated oils in them. These have trans fats. Examples are stick margarine, some tub margarines, cookies, crackers, and other baked goods. What foods can I eat? Fruits All fresh, canned (in natural juice), or frozen fruits. Vegetables Fresh or frozen vegetables (raw, steamed, roasted, or grilled). Green salads. Grains Most grains. Choose whole wheat and whole grains most of the time. Rice and pasta, including brown rice and pastas made with whole wheat. Meats and other proteins Lean, well-trimmed beef, veal, pork, and lamb. Chicken and [place] without skin. All fish and shellfish. Wild duck, rabbit, pheasant, and venison. Egg whites or low-cholesterol egg substitutes. Dried beans, peas, lentils, and tofu. Seeds and most nuts. Dairy Low-fat or nonfat cheeses, including ricotta and mozzarella. Skim or 1% milk that is liquid, powdered, or  evaporated. Buttermilk that is made with low-fat milk. Nonfat or low-fat yogurt. Fats and oils Non-hydrogenated (trans-free) margarines. Vegetable oils, including soybean, sesame, sunflower, olive, peanut, safflower, corn, canola, and cottonseed. Salad dressings or mayonnaise made with a vegetable oil. Beverages Mineral water. Coffee and tea. Diet carbonated beverages. Sweets and desserts Sherbet, gelatin, and fruit ice. Small amounts of dark chocolate. Limit all sweets and desserts. Seasonings and condiments All seasonings and condiments. The items listed above may not be a complete list of foods and drinks you can eat. Contact a dietitian for more options. What foods should I avoid? Fruits Canned fruit in heavy syrup. Fruit in cream or butter sauce. Fried fruit. Limit coconut. Vegetables Vegetables cooked in cheese, cream, or butter sauce. Fried vegetables. Grains Breads that are made with saturated or trans fats, oils, or whole milk. Croissants. Sweet rolls. Donuts. High-fat crackers, such as cheese crackers. Meats and other proteins Fatty meats, such as hot dogs, ribs, sausage, bacon, rib-eye roast or steak. High-fat deli meats, such as salami and bologna. Caviar. Domestic duck and goose. Organ meats, such as liver. Dairy Cream, sour cream, cream cheese, and creamed cottage cheese. Whole-milk cheeses. Whole or 2% milk that is liquid, evaporated, or condensed. Whole buttermilk. Cream sauce or high-fat cheese sauce. Yogurt that is made from whole milk. Fats and oils Meat fat, or shortening. Cocoa butter, hydrogenated oils, palm oil, coconut oil, palm kernel oil. Solid fats and shortenings, including bacon fat, salt pork, lard, and butter. Nondairy cream substitutes. Salad dressings with cheese or sour cream. Beverages Regular sodas and juice drinks with added sugar. Sweets and desserts Frosting. Pudding. Cookies. Cakes. Pies. Milk chocolate or white chocolate. Buttered syrups. Full-fat  ice cream or ice cream drinks. The items listed above may not be a complete list of foods and drinks to avoid. Contact a dietitian for more information. Summary  Heart-healthy meal planning includes eating less unhealthy fats, eating more healthy fats, and making other changes in your diet.  Eat a balanced diet. This includes fruits and vegetables, low-fat or nonfat dairy, lean protein, nuts and legumes, whole grains, and heart-healthy oils and fats. This information is not intended to replace advice given to you by your health care provider. Make sure you discuss any questions you have with your health care provider. Document Revised: 02/23/2017 Document Reviewed: 01/27/2017  Elsevier Patient Education  2021 [employer].

## 2020-04-10 NOTE — Discharge Summary (Addendum)
ELECTROPHYSIOLOGY PROCEDURE DISCHARGE SUMMARY    Patient ID: [name],  MRN: [id number], DOB/AGE: 1950-11-27 70 y.o.  Admit date: 04/09/2020 Discharge date: 04/10/2020  Primary Care Physician: Charolette Forward, MD  Primary Cardiologist: No primary care provider on file.  Electrophysiologist: Virl Axe, MD  (Dr. Lovena Le performed extraction and revision)  Primary Diagnosis:  Chronic systolic CHF, class III Pacing induced LBBB CHB  S/p prior ICD  Allergies  Allergen Reactions   Procainamide Other (See Comments)    Allergy is from [facility] records - pt is not aware of any reaction to this medicine   Quinidine Other (See Comments)    Allergy is from [facility] records - pt is not aware of any reaction to this medicine     Procedures This Admission:  1. Successful extraction of a 72 year old atrial pacing lead, insertion of the new Medtronic model number [id number] atrial  pacing lead, insertion of a new Medtronic model number [phone] left ventricular pacing lead, removal of a previously implanted biventricular ICD which was at ERI, and insertion of a new Medtronic model number [id number] biventricular ICD in a patient with class III heart failure, pacing induced left bundle branch block, and a longstanding ischemic and nonischemic cardiomyopathy, ejection fraction 30% by Dr. Lovena Le on 04/09/20. There were no immediate post procedure complications 2.  CXR on 04/10/20 demonstrated no pneumothorax status post device implantation. Atrial lead possibly pulled back slightly, but p wave amplitude improved from in case.    Brief HPI: [name] is a 70 y.o. male was  followed in the outpatient setting for ICD and noted to be at [facility]. Pt had pacing induced LBBB and a very complicated device history which included an abandoned left sided system.  Seen by Dr. Lovena Le and planned for device extraction with lead revision and attempt at Left bundle vs CS lead.  Past medical history  includes above. Risks, benefits, and alternatives to ICD implantation were reviewed with the patient who wished to proceed.   Hospital Course:  The patient was admitted and underwent lead extraction, new atrial lead, CS lead, and gen change of a Medtronic BiV ICD with details as outlined above. They were monitored on telemetry overnight which demonstrated appropriate pacing.  Left chest was without hematoma or ecchymosis.  The device was interrogated and found to be functioning normally.  CXR was obtained and demonstrated no pneumothorax status post device implantation..  Wound care, arm mobility, and restrictions were reviewed with the patient.  The patient was examined and considered stable for discharge to home.   Pt had mild volume overload with IVF during procedure. Given dose of IV lasix on day of discharge.   The patient's discharge medications include an ACE-I/ARB/ARNI Delene Loll) and beta blocker (coreg). Regarding blood thinner therapy, they were instructed to resume their baby aspirin until wound check due to large wound in setting of extraction.   Physical Exam: Vitals:   04/10/20 0047 04/10/20 0117 04/10/20 0147 04/10/20 0552  BP: 106/64 104/74 104/63 103/61  Pulse: 88 86 86 81  Resp: 19   20  Temp:    98.3 F (36.8 C)  TempSrc:    Oral  SpO2: 97% 94% 97% 99%  Weight:    87.9 kg  Height:        GEN- The patient is well appearing, alert and oriented x 3 today.   HEENT: normocephalic, atraumatic; sclera clear, conjunctiva pink; hearing intact; oropharynx clear; neck supple, no JVP Lymph- no  cervical lymphadenopathy Lungs- Clear to ausculation bilaterally, normal work of breathing.  No wheezes, rales, rhonchi Heart- Regular rate and rhythm, no murmurs, rubs or gallops, PMI not laterally displaced GI- soft, non-tender, non-distended, bowel sounds present, no hepatosplenomegaly Extremities- no clubbing, cyanosis, or edema; DP/PT/radial pulses 2+ bilaterally MS- no significant  deformity or atrophy Skin- warm and dry, no rash or lesion. ICD site stable. Psych- euthymic mood, full affect Neuro- strength and sensation are intact   Labs:   Lab Results  Component Value Date   WBC 4.9 04/03/2020   HGB 14.1 04/03/2020   HCT 41.9 04/03/2020   MCV 94 04/03/2020   PLT 206 04/03/2020    Recent Labs  Lab 04/03/20 0853  NA 139  K 4.3  CL 100  CO2 25  BUN 10  CREATININE 1.03  CALCIUM 9.3  GLUCOSE 115*    Discharge Medications:  Allergies as of 04/10/2020       Reactions   Procainamide Other (See Comments)   Allergy is from [facility] records - pt is not aware of any reaction to this medicine   Quinidine Other (See Comments)   Allergy is from [facility] records - pt is not aware of any reaction to this medicine        Medication List     TAKE these medications    acetaminophen 325 MG tablet Commonly known as: TYLENOL Take 1-2 tablets (325-650 mg total) by mouth every 4 (four) hours as needed for mild pain.   aspirin EC 81 MG tablet Take 1 tablet (81 mg total) by mouth daily. Please don't restart until after your Wound check on 4/22. Start taking on: April 25, 2020 What changed:  additional instructions These instructions start on April 25, 2020. If you are unsure what to do until then, ask your doctor or other care provider.   atorvastatin 40 MG tablet Commonly known as: LIPITOR Take 40 mg by mouth at bedtime.   azaTHIOprine 50 MG tablet Commonly known as: IMURAN Take 150 mg by mouth daily.   carvedilol 3.125 MG tablet Commonly known as: COREG Take 1 tablet (3.125 mg total) by mouth 2 (two) times daily with a meal. Please make overdue appt with Dr. Caryl Comes. 2nd attempt   Entresto 49-51 MG Generic drug: sacubitril-valsartan Take 1 tablet by mouth 2 (two) times daily.   furosemide 40 MG tablet Commonly known as: LASIX Take 1.5 tablets (60 mg total) by mouth daily.   GRAPESEED EXTRACT PO Take 2 capsules by mouth daily.    multivitamin capsule Take 1 capsule by mouth daily.   omeprazole 40 MG capsule Commonly known as: PRILOSEC Take 40 mg by mouth daily.   OVER THE COUNTER MEDICATION Take 1 tablet by mouth daily. Blue green algae   OVER THE COUNTER MEDICATION Take 2 tablets by mouth daily. Digestive enzymes   potassium chloride SA 20 MEQ tablet Commonly known as: KLOR-CON TAKE 1 TABLET BY MOUTH  DAILY ( MUST KEEP UPCOMING APPOINTMENT FOR FURTHER REFILLS) What changed: See the new instructions.   predniSONE 5 MG tablet Commonly known as: DELTASONE Take 5 mg by mouth every other day.   REFRESH OP Place 1 drop into both eyes daily as needed (red eyes).   sotalol 120 MG tablet Commonly known as: BETAPACE Take 1 tablet by mouth two times daily. Patient is overdue for an appointment and needs to call and schedule for further refills 2nd attempt What changed:  how much to take how to take this  when to take this additional instructions        Disposition:     Follow-up Information     [place] Follow up.   Why: on 4/22 at 1145 for post ICD extraction and revision wound check. Contact information: [place] [address] [phone]                Duration of Discharge Encounter: Greater than 30 minutes including physician time.  Jacalyn Lefevre, PA-C  04/10/2020 8:25 AM  EP Attending  Patient seen and examined. Agree with above. The patient appears to be doing well after RA lead extraction and insertion of a new RA and LV pacing lead and removal of his old ICD and insertion of a new ICD. CXR is difficult to interpret but his pacing threshold looks good. His ICD interrogation under my direction demonstrates normal device function. He will be discharged home with usual followup.   Carleene Overlie Roxie Gueye,MD

## 2020-04-10 NOTE — Progress Notes (Signed)
Orthopedic Tech Progress Note Patient Details:  [name] October 30, 1950 [id number]  Ortho Devices Type of Ortho Device: Arm sling Ortho Device/Splint Location: RUE Ortho Device/Splint Interventions: Ordered,Application   Post Interventions Patient Tolerated: Well Instructions Provided: Adjustment of device   Germaine Pomfret 04/10/2020, 6:56 AM

## 2020-04-14 ENCOUNTER — Telehealth: Payer: Self-pay | Admitting: Internal Medicine

## 2020-04-14 NOTE — Telephone Encounter (Signed)
Returned call to Pt.  Advised Pt he should continue all of his normal medications EXCEPT he should not restart aspirin until April 25, 2020.  Pt indicates understanding.

## 2020-04-14 NOTE — Telephone Encounter (Signed)
New message   Pt is calling with questions regarding his medication since his gen change with Dr. Lovena Le.

## 2020-04-16 ENCOUNTER — Ambulatory Visit: Payer: Medicare Other

## 2020-04-16 ENCOUNTER — Encounter (HOSPITAL_COMMUNITY): Payer: Self-pay | Admitting: Internal Medicine

## 2020-04-21 ENCOUNTER — Ambulatory Visit: Payer: Medicare Other

## 2020-04-24 ENCOUNTER — Other Ambulatory Visit: Payer: Self-pay

## 2020-04-24 ENCOUNTER — Ambulatory Visit (INDEPENDENT_AMBULATORY_CARE_PROVIDER_SITE_OTHER): Payer: Medicare Other | Admitting: Physician Assistant

## 2020-04-24 ENCOUNTER — Encounter: Payer: Self-pay | Admitting: Physician Assistant

## 2020-04-24 VITALS — BP 104/70 | HR 86 | Ht 68.0 in | Wt 181.0 lb

## 2020-04-24 DIAGNOSIS — I442 Atrioventricular block, complete: Secondary | ICD-10-CM

## 2020-04-24 DIAGNOSIS — I2510 Atherosclerotic heart disease of native coronary artery without angina pectoris: Secondary | ICD-10-CM | POA: Diagnosis not present

## 2020-04-24 DIAGNOSIS — Z95810 Presence of automatic (implantable) cardiac defibrillator: Secondary | ICD-10-CM | POA: Diagnosis not present

## 2020-04-24 DIAGNOSIS — I509 Heart failure, unspecified: Secondary | ICD-10-CM | POA: Diagnosis not present

## 2020-04-24 DIAGNOSIS — Z5189 Encounter for other specified aftercare: Secondary | ICD-10-CM

## 2020-04-24 DIAGNOSIS — I5022 Chronic systolic (congestive) heart failure: Secondary | ICD-10-CM

## 2020-04-24 LAB — CUP PACEART INCLINIC DEVICE CHECK
Battery Remaining Longevity: 101 mo
Battery Voltage: 3.1 V
Brady Statistic AP VP Percent: 0.3 %
Brady Statistic AP VS Percent: 0.01 %
Brady Statistic AS VP Percent: 99.59 %
Brady Statistic AS VS Percent: 0.1 %
Brady Statistic RA Percent Paced: 0.3 %
Brady Statistic RV Percent Paced: 99.84 %
Date Time Interrogation Session: 20220422142431
HighPow Impedance: 37 Ohm
HighPow Impedance: 46 Ohm
Implantable Lead Implant Date: 20090619
Implantable Lead Implant Date: 20220407
Implantable Lead Implant Date: 20220407
Implantable Lead Location: 753858
Implantable Lead Location: 753859
Implantable Lead Location: 753860
Implantable Lead Model: 4398
Implantable Lead Model: 5076
Implantable Lead Model: 7120
Implantable Pulse Generator Implant Date: 20220407
Lead Channel Impedance Value: 141.867
Lead Channel Impedance Value: 156.471
Lead Channel Impedance Value: 156.471
Lead Channel Impedance Value: 168.889
Lead Channel Impedance Value: 190 Ohm
Lead Channel Impedance Value: 266 Ohm
Lead Channel Impedance Value: 304 Ohm
Lead Channel Impedance Value: 304 Ohm
Lead Channel Impedance Value: 380 Ohm
Lead Channel Impedance Value: 380 Ohm
Lead Channel Impedance Value: 399 Ohm
Lead Channel Impedance Value: 456 Ohm
Lead Channel Impedance Value: 494 Ohm
Lead Channel Impedance Value: 494 Ohm
Lead Channel Impedance Value: 532 Ohm
Lead Channel Impedance Value: 570 Ohm
Lead Channel Impedance Value: 589 Ohm
Lead Channel Impedance Value: 627 Ohm
Lead Channel Pacing Threshold Amplitude: 0.5 V
Lead Channel Pacing Threshold Amplitude: 0.875 V
Lead Channel Pacing Threshold Amplitude: 1 V
Lead Channel Pacing Threshold Pulse Width: 0.4 ms
Lead Channel Pacing Threshold Pulse Width: 0.4 ms
Lead Channel Pacing Threshold Pulse Width: 0.4 ms
Lead Channel Sensing Intrinsic Amplitude: 13.375 mV
Lead Channel Sensing Intrinsic Amplitude: 13.375 mV
Lead Channel Sensing Intrinsic Amplitude: 2.625 mV
Lead Channel Sensing Intrinsic Amplitude: 3.375 mV
Lead Channel Setting Pacing Amplitude: 1 V
Lead Channel Setting Pacing Amplitude: 2 V
Lead Channel Setting Pacing Amplitude: 3.5 V
Lead Channel Setting Pacing Pulse Width: 0.4 ms
Lead Channel Setting Pacing Pulse Width: 0.4 ms
Lead Channel Setting Sensing Sensitivity: 0.3 mV

## 2020-04-24 NOTE — Progress Notes (Signed)
Cardiology Office Note Date:  04/24/2020  Patient ID:  [name], [name] 1950/12/18, MRN [id number] PCP:  Charolette Forward, MD  Cardiologist:  Dr. Terrence Dupont Electrophysiologist: Dr. Caryl Comes    Chief Complaint:  wound check  History of Present Illness: [name] is a 70 y.o. male with history of CM (mixed, ischemic/NICM), VT, chronic CHF (systolic), thymoma (ressected), myesthenia gravis, CHB, CAD (s/p PCI/BMS of prox LAD in 1997;  b. 4/10 LHC: nonobs dzs. (pRCA 50-70%);  c.   Myoview 08/23/11 : Scar with peri-infarct ischemia infecting the entire septum with a corresponding wall motion abnormality, moderate risk scan, EF 42%)  He last saw Dr. Caryl Comes Dec 2021, described SOB that went back some years, particularly post-prandial. Discussed that Review of his operative report from 2015 demonstrated significant successful effort finally to get a lead delivery system into the right atrium from the left subclavian vein. There comes at the right subclavian vein system was occluded, also mentions attempts at LV lead were also unsuccessful Planned for CPX to try and identify etiology of his SOB (elevated hemidiaphram (2/2 phrenic nerve injury with prior surgery) vs pulm, vs cardiac  He was ultimately referred to Dr. Lovena Le, his initial ICD insertion after syncope in 1997. He had abandonement of his left sided system and then inserton of a right sided ICD, with failure to place a biv system. 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 The ICD lead is abandoned on the left. His device was at [facility] and planned for atrial lead extraction and placement of a new RA and a left bundle area pacing.  leaving the 1997 left sided system abandoned.  04/09/20 underwent Extraction of an atrial pacing lead, removal of a previously implanted BiV ICD, insertion of a new atrial lead, a new left ventricular pacing lead, a new biventricular ICD   TODAY He is  feeling well Already feels like his breathing is better, and a general improved sense of wellbeing. No site concerns or discomfort No CP, palpitations or cardiac awareness No dizzy spells, near syncope or syncope. No SOB  He is to resume ASA today pending today's visit   Device information MDT CRT-D, RA, LV lead and can 04/09/20, RV lead is a SJM lead from 06/22/2007 He has abandoned ICD system leads on left side   Past Medical History:  Diagnosis Date  . Automatic implantable cardioverter-defibrillator in situ 01/18/2011  . AV BLOCK, COMPLETE 12/05/2007   Qualifier: Diagnosis of  By: Caryl Comes, MD, Leonidas Romberg Mack Guise   . CAD (coronary artery disease)    a. s/p PCI/BMS of prox LAD in 1997;  b. 4/10 LHC: nonobs dzs. (pRCA 50-70%);  c.   Myoview 08/23/11 : Scar with peri-infarct ischemia infecting the entire septum with a corresponding wall motion abnormality, moderate risk scan, EF 42%.  . Cardiac defibrillator in place 04/22/2013  . CHF (congestive heart failure) ([place]) 04/22/2013  . Chronic back pain    a. s/p multiple lumbar surgeries  . Chronic systolic CHF (congestive heart failure) ([place]) 05/08/2008   Qualifier: Diagnosis of  By: Caryl Comes, MD, Leonidas Romberg Mack Guise   . Complete heart block ([place])    a. h/o transient complete heart block in April 2009  . CORONARY ATHEROSCLEROSIS NATIVE CORONARY ARTERY 12/05/2007   Qualifier: Diagnosis of  By: Caryl Comes, MD, Leonidas Romberg Mack Guise   . DJD (degenerative joint disease)   . GERD (gastroesophageal reflux disease)   .  Hyperlipidemia   . Hypertension   . Ischemic cardiomyopathy    a. 05/2009 Echo: EF 40%, mild to mod glob HK, Gr 2 dd, mild LVH, Triv AI, Mild MR, PASP 37-61mmHg  . Midsternal chest pain 08/17/2011  . 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 s/p ICD 1997;   b. 06/2007 ICD upgrade to MDT Concerto Bi-V though LV lead unable to be placed.  . Myasthenia gravis   . Myasthenia gravis associated with thymoma ([place]) 04/22/2013  . MYASTHENIA  GRAVIS WITHOUT EXACERBATION 12/05/2007   Qualifier: Diagnosis of  By: Caryl Comes, MD, Leonidas Romberg Mack Guise   . Sciatica   . VENTRICULAR TACHYCARDIA 12/05/2007   Qualifier: Diagnosis of  By: Caryl Comes, MD, [place] Day, Mack Guise     Past Surgical History:  Procedure Laterality Date  . anterior lumbar interbody fusion  04/23/2007   L4-L5   . BI-VENTRICULAR IMPLANTABLE CARDIOVERTER DEFIBRILLATOR UPGRADE N/A 04/10/2013   failed CRTD upgrade  . BI-VENTRICULAR PACEMAKER UPGRADE N/A 10/14/2013   failed CRTD upgrade  . CARDIAC DEFIBRILLATOR PLACEMENT  2001; 2008   single chamber MDT ICD implanted for secondary prevention; gen change 2008; explantation 2009  . CORONARY ANGIOPLASTY WITH STENT PLACEMENT     bare metal stenting of the LAD in 1997  . GALLBLADDER SURGERY    . IMPLANTABLE CARDIOVERTER DEFIBRILLATOR (ICD) GENERATOR CHANGE N/A 04/09/2020   Procedure: MEDTRONIC BIV;ONE GEN CHANGE; EXTRACT RIGHT ATRIAL LEAD; REIMPLANT RIGHT ATRIAL LEAD;ADD LEFT BUNDLE PACING LEAD;  Surgeon: Evans Lance, MD;  Location: [place];  Service: Cardiovascular;  Laterality: N/A;  . IMPLANTABLE CARDIOVERTER DEFIBRILLATOR IMPLANT  2009; 2015   MDT dual chamber right sided device implanted 2009 with gen change 2015; unable to place LV lead    Current Outpatient Medications  Medication Sig Dispense Refill  . acetaminophen (TYLENOL) 325 MG tablet Take 1-2 tablets (325-650 mg total) by mouth every 4 (four) hours as needed for mild pain.    Derrill Memo ON 04/25/2020] aspirin EC 81 MG tablet Take 1 tablet (81 mg total) by mouth daily. Please don't restart until after your Wound check on 4/22. 30 tablet 11  . atorvastatin (LIPITOR) 40 MG tablet Take 40 mg by mouth at bedtime.    Marland Kitchen azaTHIOprine (IMURAN) 50 MG tablet Take 150 mg by mouth daily.    . carvedilol (COREG) 3.125 MG tablet Take 1 tablet (3.125 mg total) by mouth 2 (two) times daily with a meal. Please make overdue appt with Dr. Caryl Comes. 2nd attempt 30 tablet 0  . furosemide (LASIX)  40 MG tablet Take 1.5 tablets (60 mg total) by mouth daily. 135 tablet 3  . Multiple Vitamin (MULTIVITAMIN) capsule Take 1 capsule by mouth daily.    . Nutritional Supplements (GRAPESEED EXTRACT PO) Take 2 capsules by mouth daily.    Marland Kitchen omeprazole (PRILOSEC) 40 MG capsule Take 40 mg by mouth daily.    Marland Kitchen OVER THE COUNTER MEDICATION Take 1 tablet by mouth daily. Blue green algae    . OVER THE COUNTER MEDICATION Take 2 tablets by mouth daily. Digestive enzymes    . Polyvinyl Alcohol-Povidone (REFRESH OP) Place 1 drop into both eyes daily as needed (red eyes).    . potassium chloride SA (K-DUR,KLOR-CON) 20 MEQ tablet TAKE 1 TABLET BY MOUTH  DAILY ( MUST KEEP UPCOMING APPOINTMENT FOR FURTHER REFILLS) 90 tablet 3  . predniSONE (DELTASONE) 5 MG tablet Take 5 mg by mouth every other day.    . sacubitril-valsartan (ENTRESTO) 49-51 MG Take 1 tablet by  mouth 2 (two) times daily.    . sotalol (BETAPACE) 120 MG tablet Take 1 tablet by mouth two times daily. Patient is overdue for an appointment and needs to call and schedule for further refills 2nd attempt 30 tablet 0   No current facility-administered medications for this visit.    Allergies:   Procainamide and Quinidine   Social History:  The patient  reports that he quit smoking about 38 years ago. His smoking use included cigarettes. He has a 1.80 pack-year smoking history. He has never used smokeless tobacco. He reports current alcohol use. He reports that he does not use drugs.   Family History:  The patient's family history includes Coronary artery disease in his brother; Heart disease in his father and mother; Hypertension in his brother.  ROS:  Please see the history of present illness.    All other systems are reviewed and otherwise negative.   PHYSICAL EXAM:  VS:  BP 104/70   Pulse 86   Ht 5\' 8"  (1.727 m)   Wt 181 lb (82.1 kg)   SpO2 95%   BMI 27.52 kg/m  BMI: Body mass index is 27.52 kg/m. Well nourished, well developed, in no acute  distress HEENT: normocephalic, atraumatic Neck: no JVD, carotid bruits or masses Cardiac:  RRR; no significant murmurs, no rubs, or gallops Lungs:  CTA b/l, no wheezing, rhonchi or rales Abd: soft, nontender MS: no deformity or atrophy Ext: no edema Skin: warm and dry, no rash Neuro:  No gross deficits appreciated Psych: euthymic mood, full affect  ICD site: steri strips are removed without difficulty.  Wound edges are well approximated. There is some fluid in the pocket, no erythema no heat to the tissues, no tenderness.   EKG:  Done today and reviewed by myself shows  SR/Vpaced, measured AT 456ms, QTc 527, QRS is 162ms  Device interrogation done today and reviewed by myself:  Battery and lead measurements are OK A lead with acute implant outputs No arrhythmias He had only one native QRS pacing at 40bpm and mildly symptomatic   02/04/2020: CPX Conclusion: Exercise testing with gas exchange demonstrates moderate functional capacity when compared to matched sedentary norms. Given reduced PVO2 and elevated VE/VCO2 slope, patient likely has at least a mild HF limitation. Further limitations are likely due to restrictive lung patterns and body habitus. There was also chronotropic incompetence which is also a contributing factor.    07/19/2019 TTE LVEF [id number], mild diastolic dysfunction, also mentions LVEF 35-39% Trace MR Mild TR Mild AI  Dr. Olin Pia note summarizes: 2010 because of symptoms of progressive shortness of breath, he underwent evaluation including an echo demonstrating an ejection fraction of 40% or so and his Myoview scan demonstrated no ischemia. Repeat myoview scan 8/13 demonstrated infarct with peri-infarct ischemia.catheterization February 2014 demonstrated no obstructive coronary disease.  Recent Labs: 07/03/2019: NT-Pro BNP 142 12/25/2019: Magnesium 1.9 04/03/2020: BUN 10; Creatinine, Ser 1.03; Hemoglobin 14.1; Platelets 206; Potassium 4.3; Sodium 139  No results found  for requested labs within last 8760 hours.   CrCl cannot be calculated (Patient's most recent lab result is older than the maximum 21 days allowed.).   Wt Readings from Last 3 Encounters:  04/24/20 181 lb (82.1 kg)  04/10/20 193 lb 12.6 oz (87.9 kg)  04/06/20 187 lb (84.8 kg)     Other studies reviewed: Additional studies/records reviewed today include: summarized above  ASSESSMENT AND PLAN:  1. CRT-D      S/p RA lead extraction and addition of  CS/LV lead      Site is well healed      Evaluated by Dr. Lovena Le, has some fluid in the pocket, though no signs of infection      Instructed to hold his ASA another 2 weeks  2. CAD     No symptoms of angina     On ASA, BB statin     Dr. Terrence Dupont  3. Chronic CHF (systolic) 4. Mixed ischemic/NICM     No symptoms or exam findings of volume oL     No Optivol data yet     On BB, Entresto, diuretic         Disposition: F/u as scheduled, sooner if needed  Current medicines are reviewed at length with the patient today.  The patient did not have any concerns regarding medicines.  Venetia Night, PA-C 04/24/2020 1:14 PM     [address] [place] [address] [phone] (office)  [phone] (fax)

## 2020-04-24 NOTE — Patient Instructions (Addendum)
Medication Instructions:   NO ASPIRIN  FOR TWO WEEKS   Your physician recommends that you continue on your current medications as directed. Please refer to the Current Medication list given to you today.  *If you need a refill on your cardiac medications before your next appointment, please call your pharmacy*   Lab Work: [place]    If you have labs (blood work) drawn today and your tests are completely normal, you will receive your results only by: Marland Kitchen MyChart Message (if you have MyChart) OR . A paper copy in the mail If you have any lab test that is abnormal or we need to change your treatment, we will call you to review the results.   Testing/Procedures: NONE ORDERED  TODAY    Follow-Up: At [facility], you and your health needs are our priority.  As part of our continuing mission to provide you with exceptional heart care, we have created designated Provider Care Teams.  These Care Teams include your primary Cardiologist (physician) and Advanced Practice Providers (APPs -  Physician Assistants and Nurse Practitioners) who all work together to provide you with the care you need, when you need it.  We recommend signing up for the patient portal called "MyChart".  Sign up information is provided on this After Visit Summary.  MyChart is used to connect with patients for Virtual Visits (Telemedicine).  Patients are able to view lab/test results, encounter notes, upcoming appointments, etc.  Non-urgent messages can be sent to your provider as well.   To learn more about what you can do with MyChart, go to [email or web address].    Your next appointment:   AS SCHEDULED WITH DR Lovena Le   Other Instructions

## 2020-05-21 ENCOUNTER — Encounter (HOSPITAL_COMMUNITY): Payer: Self-pay

## 2020-05-21 ENCOUNTER — Telehealth (HOSPITAL_COMMUNITY): Payer: Self-pay

## 2020-05-21 NOTE — Telephone Encounter (Signed)
Attempted to call patient in regards to Pulmonary Rehab - LM on VM Mailed letter 

## 2020-06-08 ENCOUNTER — Telehealth (HOSPITAL_COMMUNITY): Payer: Self-pay

## 2020-06-08 NOTE — Telephone Encounter (Signed)
No response from pt.  Closed referral  

## 2020-07-09 ENCOUNTER — Encounter: Payer: Medicare Other | Admitting: Internal Medicine

## 2020-07-10 ENCOUNTER — Ambulatory Visit (INDEPENDENT_AMBULATORY_CARE_PROVIDER_SITE_OTHER): Payer: Medicare Other

## 2020-07-10 DIAGNOSIS — I255 Ischemic cardiomyopathy: Secondary | ICD-10-CM

## 2020-07-12 LAB — CUP PACEART REMOTE DEVICE CHECK
Battery Remaining Longevity: 96 mo
Battery Voltage: 3.06 V
Brady Statistic AP VP Percent: 0.51 %
Brady Statistic AP VS Percent: 0.01 %
Brady Statistic AS VP Percent: 98.49 %
Brady Statistic AS VS Percent: 0.99 %
Brady Statistic RA Percent Paced: 0.52 %
Brady Statistic RV Percent Paced: 98.92 %
Date Time Interrogation Session: 20220708043824
HighPow Impedance: 40 Ohm
HighPow Impedance: 46 Ohm
Implantable Lead Implant Date: 20090619
Implantable Lead Implant Date: 20220407
Implantable Lead Implant Date: 20220407
Implantable Lead Location: 753858
Implantable Lead Location: 753859
Implantable Lead Location: 753860
Implantable Lead Model: 4398
Implantable Lead Model: 5076
Implantable Lead Model: 7120
Implantable Pulse Generator Implant Date: 20220407
Lead Channel Impedance Value: 160.941
Lead Channel Impedance Value: 179.282
Lead Channel Impedance Value: 179.282
Lead Channel Impedance Value: 191.854
Lead Channel Impedance Value: 218.5 Ohm
Lead Channel Impedance Value: 266 Ohm
Lead Channel Impedance Value: 304 Ohm
Lead Channel Impedance Value: 342 Ohm
Lead Channel Impedance Value: 380 Ohm
Lead Channel Impedance Value: 437 Ohm
Lead Channel Impedance Value: 437 Ohm
Lead Channel Impedance Value: 437 Ohm
Lead Channel Impedance Value: 532 Ohm
Lead Channel Impedance Value: 570 Ohm
Lead Channel Impedance Value: 646 Ohm
Lead Channel Impedance Value: 646 Ohm
Lead Channel Impedance Value: 665 Ohm
Lead Channel Impedance Value: 760 Ohm
Lead Channel Pacing Threshold Amplitude: 0.625 V
Lead Channel Pacing Threshold Amplitude: 0.75 V
Lead Channel Pacing Threshold Amplitude: 1 V
Lead Channel Pacing Threshold Pulse Width: 0.4 ms
Lead Channel Pacing Threshold Pulse Width: 0.4 ms
Lead Channel Pacing Threshold Pulse Width: 0.4 ms
Lead Channel Sensing Intrinsic Amplitude: 13.375 mV
Lead Channel Sensing Intrinsic Amplitude: 13.375 mV
Lead Channel Sensing Intrinsic Amplitude: 3 mV
Lead Channel Sensing Intrinsic Amplitude: 3 mV
Lead Channel Setting Pacing Amplitude: 1.25 V
Lead Channel Setting Pacing Amplitude: 1.75 V
Lead Channel Setting Pacing Amplitude: 2 V
Lead Channel Setting Pacing Pulse Width: 0.4 ms
Lead Channel Setting Pacing Pulse Width: 0.4 ms
Lead Channel Setting Sensing Sensitivity: 0.3 mV

## 2020-07-21 ENCOUNTER — Encounter: Payer: Self-pay | Admitting: Internal Medicine

## 2020-07-21 ENCOUNTER — Ambulatory Visit (INDEPENDENT_AMBULATORY_CARE_PROVIDER_SITE_OTHER): Payer: Medicare Other | Admitting: Internal Medicine

## 2020-07-21 ENCOUNTER — Other Ambulatory Visit: Payer: Self-pay

## 2020-07-21 VITALS — BP 120/74 | HR 85 | Ht 68.0 in | Wt 187.0 lb

## 2020-07-21 DIAGNOSIS — I255 Ischemic cardiomyopathy: Secondary | ICD-10-CM | POA: Diagnosis not present

## 2020-07-21 DIAGNOSIS — I472 Ventricular tachycardia: Secondary | ICD-10-CM

## 2020-07-21 DIAGNOSIS — I4729 Other ventricular tachycardia: Secondary | ICD-10-CM

## 2020-07-21 DIAGNOSIS — Z95810 Presence of automatic (implantable) cardiac defibrillator: Secondary | ICD-10-CM

## 2020-07-21 NOTE — Patient Instructions (Signed)
Medication Instructions:  Your physician recommends that you continue on your current medications as directed. Please refer to the Current Medication list given to you today.  Labwork: None ordered.  Testing/Procedures: None ordered.  Follow-Up: Your physician wants you to follow-up in: 6 months with Dr. Caryl Comes or one of the following Advanced Practice Providers on your designated Care Team:   Tommye Standard, [place] Legrand Como "Jonni Sanger" Chalmers Cater, [place]  Remote monitoring is used to monitor your ICD from home. This monitoring reduces the number of office visits required to check your device to one time per year. It allows [place] to keep an eye on the functioning of your device to ensure it is working properly. You are scheduled for a device check from home on 10/09/2020. You may send your transmission at any time that day. If you have a wireless device, the transmission will be sent automatically. After your physician reviews your transmission, you will receive a postcard with your next transmission date.  Any Other Special Instructions Will Be Listed Below (If Applicable).  If you need a refill on your cardiac medications before your next appointment, please call your pharmacy.

## 2020-07-21 NOTE — Progress Notes (Signed)
HPI [name] returns today for followup. He is a pleasant 71 yo man with a h/o CHB and VT, s/p ICD insertion. 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 His QRS is now 142!Marland Kitchen He notes that he is back to work and has not had any trouble with healing over his right side. He denies chest pain, sob, or peripheral edema. No ICD therapies.  Allergies  Allergen Reactions   Procainamide Other (See Comments)    Allergy is from [facility] records - pt is not aware of any reaction to this medicine   Quinidine Other (See Comments)    Allergy is from [facility] records - pt is not aware of any reaction to this medicine     Current Outpatient Medications  Medication Sig Dispense Refill   acetaminophen (TYLENOL) 325 MG tablet Take 1-2 tablets (325-650 mg total) by mouth every 4 (four) hours as needed for mild pain.     aspirin EC 81 MG tablet Take 1 tablet (81 mg total) by mouth daily. Please don't restart until after your Wound check on 4/22. 30 tablet 11   atorvastatin (LIPITOR) 40 MG tablet Take 40 mg by mouth at bedtime.     azaTHIOprine (IMURAN) 50 MG tablet Take 150 mg by mouth daily.     carvedilol (COREG) 3.125 MG tablet Take 1 tablet (3.125 mg total) by mouth 2 (two) times daily with a meal. Please make overdue appt with Dr. Caryl Comes. 2nd attempt 30 tablet 0   furosemide (LASIX) 40 MG tablet Take 1.5 tablets (60 mg total) by mouth daily. 135 tablet 3   Multiple Vitamin (MULTIVITAMIN) capsule Take 1 capsule by mouth daily.     Nutritional Supplements (GRAPESEED EXTRACT PO) Take 2 capsules by mouth daily.     omeprazole (PRILOSEC) 40 MG capsule Take 40 mg by mouth daily.     OVER THE COUNTER MEDICATION Take 1 tablet by mouth daily. Blue green algae     OVER THE COUNTER MEDICATION Take 2 tablets by mouth daily. Digestive enzymes     Polyvinyl Alcohol-Povidone (REFRESH OP) Place 1 drop into both eyes  daily as needed (red eyes).     potassium chloride SA (K-DUR,KLOR-CON) 20 MEQ tablet TAKE 1 TABLET BY MOUTH  DAILY ( MUST KEEP UPCOMING APPOINTMENT FOR FURTHER REFILLS) 90 tablet 3   predniSONE (DELTASONE) 5 MG tablet Take 5 mg by mouth every other day.     sacubitril-valsartan (ENTRESTO) 49-51 MG Take 1 tablet by mouth 2 (two) times daily.     sotalol (BETAPACE) 120 MG tablet Take 1 tablet by mouth two times daily. Patient is overdue for an appointment and needs to call and schedule for further refills 2nd attempt 30 tablet 0   No current facility-administered medications for this visit.     Past Medical History:  Diagnosis Date   Automatic implantable cardioverter-defibrillator in situ 01/18/2011   AV BLOCK, COMPLETE 12/05/2007   Qualifier: Diagnosis of  By: Caryl Comes, MD, [facility], Mack Guise    CAD (coronary artery disease)    a. s/p PCI/BMS of prox LAD in 1997;  b. 4/10 LHC: nonobs dzs. (pRCA 50-70%);  c.   Myoview 08/23/11 : Scar with peri-infarct ischemia infecting the entire septum with a corresponding wall motion abnormality, moderate risk scan, EF 42%.   Cardiac defibrillator in place 04/22/2013   CHF (congestive heart  failure) ([place]) 04/22/2013   Chronic back pain    a. s/p multiple lumbar surgeries   Chronic systolic CHF (congestive heart failure) ([place]) 05/08/2008   Qualifier: Diagnosis of  By: Caryl Comes, MD, Remus Blake    Complete heart block [facility])    a. h/o transient complete heart block in April 2009   CORONARY ATHEROSCLEROSIS NATIVE CORONARY ARTERY 12/05/2007   Qualifier: Diagnosis of  By: Caryl Comes, MD, Remus Blake    DJD (degenerative joint disease)    GERD (gastroesophageal reflux disease)    Hyperlipidemia    Hypertension    Ischemic cardiomyopathy    a. 05/2009 Echo: EF 40%, mild to mod glob HK, Gr 2 dd, mild LVH, Triv AI, Mild MR, PASP 37-68mmHg   Midsternal chest pain 08/17/2011   Monomorphic ventricular tachycardia ([place])    a. s/p ICD 1997;   b. 06/2007 ICD  upgrade to MDT Concerto Bi-V though LV lead unable to be placed.   Myasthenia gravis    Myasthenia gravis associated with thymoma ([place]) 04/22/2013   MYASTHENIA GRAVIS WITHOUT EXACERBATION 12/05/2007   Qualifier: Diagnosis of  By: Caryl Comes, MD, Remus Blake    Sciatica    VENTRICULAR TACHYCARDIA 12/05/2007   Qualifier: Diagnosis of  By: Caryl Comes, MD, Remus Blake     ROS:   All systems reviewed and negative except as noted in the HPI.   Past Surgical History:  Procedure Laterality Date   anterior lumbar interbody fusion  04/23/2007   L4-L5    BI-VENTRICULAR IMPLANTABLE CARDIOVERTER DEFIBRILLATOR UPGRADE N/A 04/10/2013   failed CRTD upgrade   BI-VENTRICULAR PACEMAKER UPGRADE N/A 10/14/2013   failed CRTD upgrade   CARDIAC DEFIBRILLATOR PLACEMENT  2001; 2008   single chamber MDT ICD implanted for secondary prevention; gen change 2008; explantation 2009   CORONARY ANGIOPLASTY WITH STENT PLACEMENT     bare metal stenting of the LAD in [place] (ICD) GENERATOR CHANGE N/A 04/09/2020   Procedure: MEDTRONIC BIV;ONE GEN CHANGE; EXTRACT RIGHT ATRIAL LEAD; REIMPLANT RIGHT ATRIAL LEAD;ADD LEFT BUNDLE PACING LEAD;  Surgeon: Evans Lance, MD;  Location: [place];  Service: Cardiovascular;  Laterality: N/A;   IMPLANTABLE CARDIOVERTER DEFIBRILLATOR IMPLANT  2009; 2015   MDT dual chamber right sided device implanted 2009 with gen change 2015; unable to place LV lead     Family History  Problem Relation Age of Onset   Heart disease Mother    Heart disease Father    Coronary artery disease Brother    Hypertension Brother    Diabetes Neg Hx    Stroke Neg Hx      Social History   Socioeconomic History   Marital status: Single    Spouse name: Not on file   Number of children: Not on file   Years of education: Not on file   Highest education level: Not on file  Occupational History   Not on file  Tobacco Use   Smoking  status: Former    Packs/day: 0.30    Years: 6.00    Pack years: 1.80    Types: Cigarettes    Quit date: 01/03/1982    Years since quitting: 38.5   Smokeless tobacco: Never  Substance and Sexual Activity   Alcohol use: Yes    Comment: occasional   Drug use: No   Sexual activity: Not on file  Other Topics Concern   Not on file  Social History Narrative   Not  on file   Social Determinants of Health   Financial Resource Strain: Not on file  Food Insecurity: Not on file  Transportation Needs: Not on file  Physical Activity: Not on file  Stress: Not on file  Social Connections: Not on file  Intimate Partner Violence: Not on file     BP 120/74   Pulse 85   Ht 5\' 8"  (1.727 m)   Wt 187 lb (84.8 kg)   SpO2 94%   BMI 28.43 kg/m   Physical Exam:  Well appearing NAD HEENT: Unremarkable Neck:  No JVD, no thyromegally Lymphatics:  No adenopathy Back:  No CVA tenderness Lungs:  Clear HEART:  Regular rate rhythm, no murmurs, no rubs, no clicks Abd:  soft, positive bowel sounds, no organomegally, no rebound, no guarding Ext:  2 plus pulses, no edema, no cyanosis, no clubbing Skin:  No rashes no nodules Neuro:  CN II through XII intact, motor grossly intact  EKG - nsr with biv pacing  DEVICE  Normal device function.  See PaceArt for details.   Assess/Plan:  Chronic systolic heart failure - his symptoms are class 2A. He is back to work and feels well. He will continue maximal medical therapy under the direction of Dr. Doylene Canard.  CHB - he has no escape today at 30/min. He is asymptomatic with his biv ICD VT - he has not had any VT on sotalol. Continue. BIV ICD - he has undergone extraction of his RA lead and insertion of a new RA lead and a LV lead with a QRS duration going from 192 to 142. Continue. He has over 8 years on the battery.  Carleene Overlie Shaasia Odle,MD

## 2020-08-03 NOTE — Progress Notes (Signed)
Remote ICD transmission.   

## 2020-10-09 ENCOUNTER — Ambulatory Visit (INDEPENDENT_AMBULATORY_CARE_PROVIDER_SITE_OTHER): Payer: Medicare Other

## 2020-10-09 DIAGNOSIS — I255 Ischemic cardiomyopathy: Secondary | ICD-10-CM

## 2020-10-09 DIAGNOSIS — I10 Essential (primary) hypertension: Secondary | ICD-10-CM | POA: Diagnosis not present

## 2020-10-09 DIAGNOSIS — I5020 Unspecified systolic (congestive) heart failure: Secondary | ICD-10-CM | POA: Diagnosis not present

## 2020-10-09 DIAGNOSIS — E785 Hyperlipidemia, unspecified: Secondary | ICD-10-CM | POA: Diagnosis not present

## 2020-10-13 LAB — CUP PACEART REMOTE DEVICE CHECK
Battery Remaining Longevity: 91 mo
Battery Voltage: 3.01 V
Brady Statistic AP VP Percent: 1.4 %
Brady Statistic AP VS Percent: 0.01 %
Brady Statistic AS VP Percent: 98.55 %
Brady Statistic AS VS Percent: 0.04 %
Brady Statistic RA Percent Paced: 1.4 %
Brady Statistic RV Percent Paced: 99.93 %
Date Time Interrogation Session: 20221007114447
HighPow Impedance: 40 Ohm
HighPow Impedance: 45 Ohm
Implantable Lead Implant Date: 20090619
Implantable Lead Implant Date: 20220407
Implantable Lead Implant Date: 20220407
Implantable Lead Location: 753858
Implantable Lead Location: 753859
Implantable Lead Location: 753860
Implantable Lead Model: 4398
Implantable Lead Model: 5076
Implantable Lead Model: 7120
Implantable Pulse Generator Implant Date: 20220407
Lead Channel Impedance Value: 145.871
Lead Channel Impedance Value: 156.471
Lead Channel Impedance Value: 165.351
Lead Channel Impedance Value: 185.725
Lead Channel Impedance Value: 203.256
Lead Channel Impedance Value: 247 Ohm
Lead Channel Impedance Value: 266 Ohm
Lead Channel Impedance Value: 323 Ohm
Lead Channel Impedance Value: 342 Ohm
Lead Channel Impedance Value: 380 Ohm
Lead Channel Impedance Value: 399 Ohm
Lead Channel Impedance Value: 437 Ohm
Lead Channel Impedance Value: 494 Ohm
Lead Channel Impedance Value: 513 Ohm
Lead Channel Impedance Value: 589 Ohm
Lead Channel Impedance Value: 627 Ohm
Lead Channel Impedance Value: 665 Ohm
Lead Channel Impedance Value: 703 Ohm
Lead Channel Pacing Threshold Amplitude: 0.625 V
Lead Channel Pacing Threshold Amplitude: 0.875 V
Lead Channel Pacing Threshold Amplitude: 1 V
Lead Channel Pacing Threshold Pulse Width: 0.4 ms
Lead Channel Pacing Threshold Pulse Width: 0.4 ms
Lead Channel Pacing Threshold Pulse Width: 0.4 ms
Lead Channel Sensing Intrinsic Amplitude: 13.375 mV
Lead Channel Sensing Intrinsic Amplitude: 13.375 mV
Lead Channel Sensing Intrinsic Amplitude: 2.75 mV
Lead Channel Sensing Intrinsic Amplitude: 2.75 mV
Lead Channel Setting Pacing Amplitude: 1.25 V
Lead Channel Setting Pacing Amplitude: 2 V
Lead Channel Setting Pacing Amplitude: 2.25 V
Lead Channel Setting Pacing Pulse Width: 0.4 ms
Lead Channel Setting Pacing Pulse Width: 0.4 ms
Lead Channel Setting Sensing Sensitivity: 0.3 mV

## 2020-10-20 NOTE — Progress Notes (Signed)
Remote ICD transmission.   

## 2021-01-08 ENCOUNTER — Ambulatory Visit (INDEPENDENT_AMBULATORY_CARE_PROVIDER_SITE_OTHER): Payer: Medicare Other

## 2021-01-08 DIAGNOSIS — I255 Ischemic cardiomyopathy: Secondary | ICD-10-CM

## 2021-01-10 LAB — CUP PACEART REMOTE DEVICE CHECK
Battery Remaining Longevity: 86 mo
Battery Voltage: 3 V
Brady Statistic AP VP Percent: 1.48 %
Brady Statistic AP VS Percent: 0.01 %
Brady Statistic AS VP Percent: 98.47 %
Brady Statistic AS VS Percent: 0.04 %
Brady Statistic RA Percent Paced: 1.48 %
Brady Statistic RV Percent Paced: 99.93 %
Date Time Interrogation Session: 20230106192207
HighPow Impedance: 39 Ohm
HighPow Impedance: 47 Ohm
Implantable Lead Implant Date: 20090619
Implantable Lead Implant Date: 20220407
Implantable Lead Implant Date: 20220407
Implantable Lead Location: 753858
Implantable Lead Location: 753859
Implantable Lead Location: 753860
Implantable Lead Model: 4398
Implantable Lead Model: 5076
Implantable Lead Model: 7120
Implantable Pulse Generator Implant Date: 20220407
Lead Channel Impedance Value: 141.867
Lead Channel Impedance Value: 156.471
Lead Channel Impedance Value: 159.6 Ohm
Lead Channel Impedance Value: 172.541
Lead Channel Impedance Value: 194.634
Lead Channel Impedance Value: 247 Ohm
Lead Channel Impedance Value: 266 Ohm
Lead Channel Impedance Value: 304 Ohm
Lead Channel Impedance Value: 342 Ohm
Lead Channel Impedance Value: 380 Ohm
Lead Channel Impedance Value: 399 Ohm
Lead Channel Impedance Value: 437 Ohm
Lead Channel Impedance Value: 456 Ohm
Lead Channel Impedance Value: 513 Ohm
Lead Channel Impedance Value: 570 Ohm
Lead Channel Impedance Value: 589 Ohm
Lead Channel Impedance Value: 627 Ohm
Lead Channel Impedance Value: 665 Ohm
Lead Channel Pacing Threshold Amplitude: 0.625 V
Lead Channel Pacing Threshold Amplitude: 0.875 V
Lead Channel Pacing Threshold Amplitude: 1 V
Lead Channel Pacing Threshold Pulse Width: 0.4 ms
Lead Channel Pacing Threshold Pulse Width: 0.4 ms
Lead Channel Pacing Threshold Pulse Width: 0.4 ms
Lead Channel Sensing Intrinsic Amplitude: 13.375 mV
Lead Channel Sensing Intrinsic Amplitude: 13.375 mV
Lead Channel Sensing Intrinsic Amplitude: 2.75 mV
Lead Channel Sensing Intrinsic Amplitude: 2.75 mV
Lead Channel Setting Pacing Amplitude: 1.25 V
Lead Channel Setting Pacing Amplitude: 2 V
Lead Channel Setting Pacing Amplitude: 2.25 V
Lead Channel Setting Pacing Pulse Width: 0.4 ms
Lead Channel Setting Pacing Pulse Width: 0.4 ms
Lead Channel Setting Sensing Sensitivity: 0.3 mV

## 2021-01-19 NOTE — Progress Notes (Signed)
Remote ICD transmission.   

## 2021-01-27 DIAGNOSIS — I255 Ischemic cardiomyopathy: Secondary | ICD-10-CM | POA: Diagnosis not present

## 2021-01-27 DIAGNOSIS — F17290 Nicotine dependence, other tobacco product, uncomplicated: Secondary | ICD-10-CM | POA: Diagnosis not present

## 2021-01-27 DIAGNOSIS — E785 Hyperlipidemia, unspecified: Secondary | ICD-10-CM | POA: Diagnosis not present

## 2021-01-27 DIAGNOSIS — I25118 Atherosclerotic heart disease of native coronary artery with other forms of angina pectoris: Secondary | ICD-10-CM | POA: Diagnosis not present

## 2021-01-27 DIAGNOSIS — I5020 Unspecified systolic (congestive) heart failure: Secondary | ICD-10-CM | POA: Diagnosis not present

## 2021-01-27 DIAGNOSIS — I10 Essential (primary) hypertension: Secondary | ICD-10-CM | POA: Diagnosis not present

## 2021-02-18 ENCOUNTER — Ambulatory Visit (INDEPENDENT_AMBULATORY_CARE_PROVIDER_SITE_OTHER): Payer: Medicare Other | Admitting: Internal Medicine

## 2021-02-18 ENCOUNTER — Other Ambulatory Visit: Payer: Self-pay

## 2021-02-18 ENCOUNTER — Encounter: Payer: Self-pay | Admitting: Internal Medicine

## 2021-02-18 VITALS — BP 100/60 | HR 85 | Ht 68.0 in | Wt 184.6 lb

## 2021-02-18 DIAGNOSIS — Z95810 Presence of automatic (implantable) cardiac defibrillator: Secondary | ICD-10-CM

## 2021-02-18 DIAGNOSIS — I472 Ventricular tachycardia, unspecified: Secondary | ICD-10-CM

## 2021-02-18 DIAGNOSIS — I5022 Chronic systolic (congestive) heart failure: Secondary | ICD-10-CM | POA: Diagnosis not present

## 2021-02-18 DIAGNOSIS — I4729 Other ventricular tachycardia: Secondary | ICD-10-CM | POA: Diagnosis not present

## 2021-02-18 DIAGNOSIS — I255 Ischemic cardiomyopathy: Secondary | ICD-10-CM | POA: Diagnosis not present

## 2021-02-18 DIAGNOSIS — I442 Atrioventricular block, complete: Secondary | ICD-10-CM

## 2021-02-18 NOTE — Patient Instructions (Signed)

## 2021-02-18 NOTE — Progress Notes (Signed)
seborrheic keratosis include Coumadin) forf Patient Care Team: Ladell Pier, MD as PCP - General (Internal Medicine) Deboraha Sprang, MD as PCP - Electrophysiology (Cardiology)   HPI  [name] is a 71 y.o. male is seen in followup for ventricular tachycardia in the setting of ischemic and nonischemic heart disease. He is status post ICD implantation.   Following multiple failed attempts at left ventricular lead placement, he underwent a complex revision procedure 4/22 with Dr. Elliot Cousin.  He underwent extraction of an old atrial pacing lead with insertion of a new atrial lead, successful deployment of an LV pacing lead with generator replacement.  ECG post implant demonstrated a qR in lead I and an upright QRS in lead V2; V1 was not recorded.  QRSd prior to upgrade was 192, post upgrade was about 140 ms. He is much improved.  Only mildly limiting shortness of breath.  No edema nocturnal dyspnea orthopnea.  No chest pain.         DATE TEST EF   9/13 LHC  45% Nonobstructive CAs  9/15 Echo  30-35 %   1/17 Echo  40-45 %   7/21 Echo  40%       Date Cr K Hgb  3/18 1.0 4.0 15   6/21 1.01 4.3   4/22 1.03 4.3 14.1     MG followed at [facility]--last seen 2019  Review of his operative report from 2015 demonstrated significant successful effort finally to get a lead delivery system into the right atrium from the left subclavian vein. There comes at the right subclavian vein system was occluded. Past Medical History:  Diagnosis Date   Automatic implantable cardioverter-defibrillator in situ 01/18/2011   AV BLOCK, COMPLETE 12/05/2007   Qualifier: Diagnosis of  By: Caryl Comes, MD, [facility], Mack Guise    CAD (coronary artery disease)    a. s/p PCI/BMS of prox LAD in 1997;  b. 4/10 LHC: nonobs dzs. (pRCA 50-70%);  c.   Myoview 08/23/11 : Scar with peri-infarct ischemia infecting the entire septum with a corresponding wall motion abnormality, moderate risk scan, EF 42%.   Cardiac defibrillator in  place 04/22/2013   CHF (congestive heart failure) ([place]) 04/22/2013   Chronic back pain    a. s/p multiple lumbar surgeries   Chronic systolic CHF (congestive heart failure) ([place]) 05/08/2008   Qualifier: Diagnosis of  By: Caryl Comes, MD, Remus Blake    Complete heart block [facility])    a. h/o transient complete heart block in April 2009   CORONARY ATHEROSCLEROSIS NATIVE CORONARY ARTERY 12/05/2007   Qualifier: Diagnosis of  By: Caryl Comes, MD, Remus Blake    DJD (degenerative joint disease)    GERD (gastroesophageal reflux disease)    Hyperlipidemia    Hypertension    Ischemic cardiomyopathy    a. 05/2009 Echo: EF 40%, mild to mod glob HK, Gr 2 dd, mild LVH, Triv AI, Mild MR, PASP 37-51mmHg   Midsternal chest pain 08/17/2011   Monomorphic ventricular tachycardia    a. s/p ICD 1997;   b. 06/2007 ICD upgrade to MDT Concerto Bi-V though LV lead unable to be placed.   Myasthenia gravis    Myasthenia gravis associated with thymoma ([place]) 04/22/2013   MYASTHENIA GRAVIS WITHOUT EXACERBATION 12/05/2007   Qualifier: Diagnosis of  By: Caryl Comes, MD, Remus Blake    Sciatica    VENTRICULAR TACHYCARDIA 12/05/2007   Qualifier: Diagnosis of  By: Caryl Comes, MD, Leonidas Romberg Mack Guise     Past Surgical History:  Procedure  Laterality Date   anterior lumbar interbody fusion  04/23/2007   L4-L5    BI-VENTRICULAR IMPLANTABLE CARDIOVERTER DEFIBRILLATOR UPGRADE N/A 04/10/2013   failed CRTD upgrade   BI-VENTRICULAR PACEMAKER UPGRADE N/A 10/14/2013   failed CRTD upgrade   CARDIAC DEFIBRILLATOR PLACEMENT  2001; 2008   single chamber MDT ICD implanted for secondary prevention; gen change 2008; explantation 2009   CORONARY ANGIOPLASTY WITH STENT PLACEMENT     bare metal stenting of the LAD in [place] (ICD) GENERATOR CHANGE N/A 04/09/2020   Procedure: MEDTRONIC BIV;ONE GEN CHANGE; EXTRACT RIGHT ATRIAL LEAD; REIMPLANT RIGHT ATRIAL LEAD;ADD LEFT BUNDLE PACING  LEAD;  Surgeon: Evans Lance, MD;  Location: [place];  Service: Cardiovascular;  Laterality: N/A;   IMPLANTABLE CARDIOVERTER DEFIBRILLATOR IMPLANT  2009; 2015   MDT dual chamber right sided device implanted 2009 with gen change 2015; unable to place LV lead    Current Outpatient Medications  Medication Sig Dispense Refill   acetaminophen (TYLENOL) 325 MG tablet Take 1-2 tablets (325-650 mg total) by mouth every 4 (four) hours as needed for mild pain.     aspirin EC 81 MG tablet Take 1 tablet (81 mg total) by mouth daily. Please don't restart until after your Wound check on 4/22. 30 tablet 11   atorvastatin (LIPITOR) 40 MG tablet Take 40 mg by mouth at bedtime.     azaTHIOprine (IMURAN) 50 MG tablet Take 150 mg by mouth daily.     carvedilol (COREG) 3.125 MG tablet Take 1 tablet (3.125 mg total) by mouth 2 (two) times daily with a meal. Please make overdue appt with Dr. Caryl Comes. 2nd attempt 30 tablet 0   furosemide (LASIX) 40 MG tablet Take 1.5 tablets (60 mg total) by mouth daily. 135 tablet 3   Multiple Vitamin (MULTIVITAMIN) capsule Take 1 capsule by mouth daily.     Nutritional Supplements (GRAPESEED EXTRACT PO) Take 2 capsules by mouth daily.     omeprazole (PRILOSEC) 40 MG capsule Take 40 mg by mouth daily.     OVER THE COUNTER MEDICATION Take 1 tablet by mouth daily. Blue green algae     OVER THE COUNTER MEDICATION Take 2 tablets by mouth daily. Digestive enzymes     Polyvinyl Alcohol-Povidone (REFRESH OP) Place 1 drop into both eyes daily as needed (red eyes).     potassium chloride SA (K-DUR,KLOR-CON) 20 MEQ tablet TAKE 1 TABLET BY MOUTH  DAILY ( MUST KEEP UPCOMING APPOINTMENT FOR FURTHER REFILLS) 90 tablet 3   predniSONE (DELTASONE) 5 MG tablet Take 5 mg by mouth every other day.     sacubitril-valsartan (ENTRESTO) 49-51 MG Take 1 tablet by mouth 2 (two) times daily.     sotalol (BETAPACE) 120 MG tablet Take 1 tablet by mouth two times daily. Patient is overdue for an appointment and  needs to call and schedule for further refills 2nd attempt 30 tablet 0   No current facility-administered medications for this visit.    Allergies  Allergen Reactions   Procainamide Other (See Comments)    Allergy is from [facility] records - pt is not aware of any reaction to this medicine   Quinidine Other (See Comments)    Allergy is from [facility] records - pt is not aware of any reaction to this medicine    Review of Systems negative except from HPI and PMH  Physical Exam BP 100/60    Pulse 85    Ht 5'  8" (1.727 m)    Wt 184 lb 9.6 oz (83.7 kg)    SpO2 96%    BMI 28.07 kg/m  Well developed and well nourished in no acute distress HENT normal Neck supple with JVP-flat Clear Device pocket well healed; without hematoma or erythema.  There is no tethering  Regular rate and rhythm, no  gallop No  murmur Abd-soft with active BS No Clubbing cyanosis  edema Skin-warm and dry A & Oriented  Grossly normal sensory and motor function  ECG sinus with P synchronous pacing at 85 Intervals 14/14/44 QRS upright lead I and negative lead V1 Assessment and  Plan   Ischemic and nonischemic cardiomyopathy  Ventricular tachycardia  Complete heart block with CRT pacing  Chronic systolic heart failure  \High Risk Medication Surveillance  Implantable defibrillator Medtronic    Hypertension  He is significantly improved following successful CRT.  QRS duration markedly shortened even without QRS morphology criteria.  He is euvolemic.  Continue furosemide 60 mg a day  No interval ventricular tachycardia.  We will continue him on Betapace 120 twice daily.  With his cardiomyopathy, continue Entresto 49/51 carvedilol 3.125 twice daily we will anticipate an echocardiogram

## 2021-02-27 ENCOUNTER — Encounter (HOSPITAL_BASED_OUTPATIENT_CLINIC_OR_DEPARTMENT_OTHER): Payer: Self-pay

## 2021-02-27 ENCOUNTER — Emergency Department (HOSPITAL_BASED_OUTPATIENT_CLINIC_OR_DEPARTMENT_OTHER)
Admission: EM | Admit: 2021-02-27 | Discharge: 2021-02-28 | Discharge status: Against Medical Advice | Payer: Medicare Other | Attending: Emergency Medicine | Admitting: Emergency Medicine

## 2021-02-27 DIAGNOSIS — R21 Rash and other nonspecific skin eruption: Secondary | ICD-10-CM | POA: Insufficient documentation

## 2021-02-27 DIAGNOSIS — Z5321 Procedure and treatment not carried out due to patient leaving prior to being seen by health care provider: Secondary | ICD-10-CM | POA: Insufficient documentation

## 2021-02-27 NOTE — ED Notes (Signed)
Patient called to bring back to room. No answer.

## 2021-02-27 NOTE — ED Triage Notes (Signed)
Pt presents to the ED with a rash on bilateral arms. 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 Reports the rash to be itchy. Took benadryl yesterday morning. Pt A&Ox4 at time of triage. VSS

## 2021-03-12 ENCOUNTER — Ambulatory Visit: Payer: Medicare Other | Admitting: Internal Medicine

## 2021-03-15 ENCOUNTER — Ambulatory Visit: Payer: Medicare Other | Attending: Family Medicine | Admitting: Family Medicine

## 2021-03-15 ENCOUNTER — Other Ambulatory Visit: Payer: Self-pay

## 2021-04-09 ENCOUNTER — Ambulatory Visit (INDEPENDENT_AMBULATORY_CARE_PROVIDER_SITE_OTHER): Payer: Medicare Other

## 2021-04-09 DIAGNOSIS — I4729 Other ventricular tachycardia: Secondary | ICD-10-CM | POA: Diagnosis not present

## 2021-04-12 LAB — CUP PACEART REMOTE DEVICE CHECK
Battery Remaining Longevity: 84 mo
Battery Voltage: 3 V
Brady Statistic AP VP Percent: 1.64 %
Brady Statistic AP VS Percent: 0.01 %
Brady Statistic AS VP Percent: 98.35 %
Brady Statistic AS VS Percent: 0 %
Brady Statistic RA Percent Paced: 1.64 %
Brady Statistic RV Percent Paced: 99.98 %
Date Time Interrogation Session: 20230407022604
HighPow Impedance: 39 Ohm
HighPow Impedance: 47 Ohm
Implantable Lead Implant Date: 20090619
Implantable Lead Implant Date: 20220407
Implantable Lead Implant Date: 20220407
Implantable Lead Location: 753858
Implantable Lead Location: 753859
Implantable Lead Location: 753860
Implantable Lead Model: 4398
Implantable Lead Model: 5076
Implantable Lead Model: 7120
Implantable Pulse Generator Implant Date: 20220407
Lead Channel Impedance Value: 156.606
Lead Channel Impedance Value: 160.941
Lead Channel Impedance Value: 172.541
Lead Channel Impedance Value: 178.5 Ohm
Lead Channel Impedance Value: 184.154
Lead Channel Impedance Value: 266 Ohm
Lead Channel Impedance Value: 304 Ohm
Lead Channel Impedance Value: 323 Ohm
Lead Channel Impedance Value: 342 Ohm
Lead Channel Impedance Value: 342 Ohm
Lead Channel Impedance Value: 399 Ohm
Lead Channel Impedance Value: 399 Ohm
Lead Channel Impedance Value: 399 Ohm
Lead Channel Impedance Value: 513 Ohm
Lead Channel Impedance Value: 532 Ohm
Lead Channel Impedance Value: 589 Ohm
Lead Channel Impedance Value: 646 Ohm
Lead Channel Impedance Value: 646 Ohm
Lead Channel Pacing Threshold Amplitude: 0.625 V
Lead Channel Pacing Threshold Amplitude: 0.75 V
Lead Channel Pacing Threshold Amplitude: 0.875 V
Lead Channel Pacing Threshold Pulse Width: 0.4 ms
Lead Channel Pacing Threshold Pulse Width: 0.4 ms
Lead Channel Pacing Threshold Pulse Width: 0.4 ms
Lead Channel Sensing Intrinsic Amplitude: 13.375 mV
Lead Channel Sensing Intrinsic Amplitude: 13.375 mV
Lead Channel Sensing Intrinsic Amplitude: 3.125 mV
Lead Channel Sensing Intrinsic Amplitude: 3.125 mV
Lead Channel Setting Pacing Amplitude: 1.25 V
Lead Channel Setting Pacing Amplitude: 1.5 V
Lead Channel Setting Pacing Amplitude: 2 V
Lead Channel Setting Pacing Pulse Width: 0.4 ms
Lead Channel Setting Pacing Pulse Width: 0.4 ms
Lead Channel Setting Sensing Sensitivity: 0.3 mV

## 2021-04-27 NOTE — Progress Notes (Signed)
Remote ICD transmission.   

## 2021-05-05 DIAGNOSIS — I10 Essential (primary) hypertension: Secondary | ICD-10-CM | POA: Diagnosis not present

## 2021-05-05 DIAGNOSIS — I255 Ischemic cardiomyopathy: Secondary | ICD-10-CM | POA: Diagnosis not present

## 2021-05-05 DIAGNOSIS — I25118 Atherosclerotic heart disease of native coronary artery with other forms of angina pectoris: Secondary | ICD-10-CM | POA: Diagnosis not present

## 2021-05-05 DIAGNOSIS — I38 Endocarditis, valve unspecified: Secondary | ICD-10-CM | POA: Diagnosis not present

## 2021-05-05 DIAGNOSIS — I5020 Unspecified systolic (congestive) heart failure: Secondary | ICD-10-CM | POA: Diagnosis not present

## 2021-06-08 ENCOUNTER — Telehealth: Payer: Self-pay

## 2021-06-08 NOTE — Telephone Encounter (Signed)
No episodes/shocks noted on device.
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
## 2021-06-29 ENCOUNTER — Ambulatory Visit: Payer: Self-pay | Admitting: *Deleted

## 2021-06-29 ENCOUNTER — Telehealth: Payer: Self-pay | Admitting: Internal Medicine

## 2021-06-29 NOTE — Telephone Encounter (Signed)
Remote transmission received. Normal device function. No episodes noted. Patient called and updated. Appreciative for call.
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
## 2021-07-09 ENCOUNTER — Ambulatory Visit (INDEPENDENT_AMBULATORY_CARE_PROVIDER_SITE_OTHER): Payer: Medicare Other

## 2021-07-09 DIAGNOSIS — I442 Atrioventricular block, complete: Secondary | ICD-10-CM | POA: Diagnosis not present

## 2021-07-10 LAB — CUP PACEART REMOTE DEVICE CHECK
Battery Remaining Longevity: 80 mo
Battery Voltage: 2.99 V
Brady Statistic AP VP Percent: 0.91 %
Brady Statistic AP VS Percent: 0.01 %
Brady Statistic AS VP Percent: 99.08 %
Brady Statistic AS VS Percent: 0 %
Brady Statistic RA Percent Paced: 0.92 %
Brady Statistic RV Percent Paced: 99.96 %
Date Time Interrogation Session: 20230707082602
HighPow Impedance: 37 Ohm
HighPow Impedance: 41 Ohm
Implantable Lead Implant Date: 20090619
Implantable Lead Implant Date: 20220407
Implantable Lead Implant Date: 20220407
Implantable Lead Location: 753858
Implantable Lead Location: 753859
Implantable Lead Location: 753860
Implantable Lead Model: 4398
Implantable Lead Model: 5076
Implantable Lead Model: 7120
Implantable Pulse Generator Implant Date: 20220407
Lead Channel Impedance Value: 136.276
Lead Channel Impedance Value: 139.967
Lead Channel Impedance Value: 152.559
Lead Channel Impedance Value: 172.541
Lead Channel Impedance Value: 178.5 Ohm
Lead Channel Impedance Value: 247 Ohm
Lead Channel Impedance Value: 247 Ohm
Lead Channel Impedance Value: 304 Ohm
Lead Channel Impedance Value: 323 Ohm
Lead Channel Impedance Value: 323 Ohm
Lead Channel Impedance Value: 399 Ohm
Lead Channel Impedance Value: 399 Ohm
Lead Channel Impedance Value: 399 Ohm
Lead Channel Impedance Value: 494 Ohm
Lead Channel Impedance Value: 513 Ohm
Lead Channel Impedance Value: 570 Ohm
Lead Channel Impedance Value: 589 Ohm
Lead Channel Impedance Value: 627 Ohm
Lead Channel Pacing Threshold Amplitude: 0.625 V
Lead Channel Pacing Threshold Amplitude: 0.875 V
Lead Channel Pacing Threshold Amplitude: 0.875 V
Lead Channel Pacing Threshold Pulse Width: 0.4 ms
Lead Channel Pacing Threshold Pulse Width: 0.4 ms
Lead Channel Pacing Threshold Pulse Width: 0.4 ms
Lead Channel Sensing Intrinsic Amplitude: 12 mV
Lead Channel Sensing Intrinsic Amplitude: 12 mV
Lead Channel Sensing Intrinsic Amplitude: 2.875 mV
Lead Channel Sensing Intrinsic Amplitude: 2.875 mV
Lead Channel Setting Pacing Amplitude: 1.25 V
Lead Channel Setting Pacing Amplitude: 1.75 V
Lead Channel Setting Pacing Amplitude: 2 V
Lead Channel Setting Pacing Pulse Width: 0.4 ms
Lead Channel Setting Pacing Pulse Width: 0.4 ms
Lead Channel Setting Sensing Sensitivity: 0.3 mV

## 2021-07-26 NOTE — Progress Notes (Signed)
Remote ICD transmission.   

## 2021-09-17 ENCOUNTER — Telehealth: Payer: Self-pay

## 2021-09-17 DIAGNOSIS — I10 Essential (primary) hypertension: Secondary | ICD-10-CM | POA: Diagnosis not present

## 2021-09-17 DIAGNOSIS — E785 Hyperlipidemia, unspecified: Secondary | ICD-10-CM | POA: Diagnosis not present

## 2021-09-17 DIAGNOSIS — R7303 Prediabetes: Secondary | ICD-10-CM | POA: Diagnosis not present

## 2021-09-17 NOTE — Patient Outreach (Signed)
  Care Coordination   09/17/2021 Name: [name] MRN: [id number] DOB: 07/02/1950   Care Coordination Outreach Attempts:  An unsuccessful telephone outreach was attempted today to offer the patient information about available care coordination services as a benefit of their health plan.   Follow Up Plan:  Additional outreach attempts will be made to offer the patient care coordination information and services.   Encounter Outcome:  No Answer  Care Coordination Interventions Activated:  No   Care Coordination Interventions:  No, not indicated    [place] Management [phone]

## 2021-09-23 ENCOUNTER — Telehealth: Payer: Self-pay | Admitting: Internal Medicine

## 2021-09-23 DIAGNOSIS — M79646 Pain in unspecified finger(s): Secondary | ICD-10-CM

## 2021-09-23 NOTE — Telephone Encounter (Signed)
Routing to PCP for review.

## 2021-09-23 NOTE — Telephone Encounter (Signed)
Referral Request - Has patient seen PCP for this complaint? no *If NO, is insurance requiring patient see PCP for this issue before PCP can refer them? Referral for which specialty: hand specialist Preferred provider/office: [facility]/patient has an appt tomorrow, but needs referral Reason for referral: problem with ring finger /didn't give details  Please call patient back

## 2021-09-24 NOTE — Telephone Encounter (Signed)
Referral has been placed. 

## 2021-09-24 NOTE — Telephone Encounter (Signed)
Message states that call can not be completed at this time. 

## 2021-09-28 DIAGNOSIS — M65331 Trigger finger, right middle finger: Secondary | ICD-10-CM | POA: Diagnosis not present

## 2021-09-28 DIAGNOSIS — M65342 Trigger finger, left ring finger: Secondary | ICD-10-CM | POA: Diagnosis not present

## 2021-09-28 DIAGNOSIS — G5602 Carpal tunnel syndrome, left upper limb: Secondary | ICD-10-CM | POA: Diagnosis not present

## 2021-09-30 ENCOUNTER — Telehealth: Payer: Self-pay

## 2021-09-30 NOTE — Patient Outreach (Signed)
  Care Coordination   09/30/2021 Name: LAITH ANTONELLI MRN: [id number] DOB: 1950-06-24   Care Coordination Outreach Attempts:  A second unsuccessful outreach was attempted today to offer the patient with information about available care coordination services as a benefit of their health plan.     Follow Up Plan:  Additional outreach attempts will be made to offer the patient care coordination information and services.   Encounter Outcome:  No Answer  Care Coordination Interventions Activated:  No   Care Coordination Interventions:  No, not indicated    [place] Management [phone]

## 2021-10-08 ENCOUNTER — Ambulatory Visit (INDEPENDENT_AMBULATORY_CARE_PROVIDER_SITE_OTHER): Payer: Medicare Other

## 2021-10-08 DIAGNOSIS — I255 Ischemic cardiomyopathy: Secondary | ICD-10-CM | POA: Diagnosis not present

## 2021-10-11 LAB — CUP PACEART REMOTE DEVICE CHECK
Battery Remaining Longevity: 78 mo
Battery Voltage: 2.99 V
Brady Statistic AP VP Percent: 1.98 %
Brady Statistic AP VS Percent: 0.01 %
Brady Statistic AS VP Percent: 98.01 %
Brady Statistic AS VS Percent: 0 %
Brady Statistic RA Percent Paced: 1.98 %
Brady Statistic RV Percent Paced: 99.98 %
Date Time Interrogation Session: 20231006012207
HighPow Impedance: 40 Ohm
HighPow Impedance: 47 Ohm
Implantable Lead Implant Date: 20090619
Implantable Lead Implant Date: 20220407
Implantable Lead Implant Date: 20220407
Implantable Lead Location: 753858
Implantable Lead Location: 753859
Implantable Lead Location: 753860
Implantable Lead Model: 4398
Implantable Lead Model: 5076
Implantable Lead Model: 7120
Implantable Pulse Generator Implant Date: 20220407
Lead Channel Impedance Value: 156.606
Lead Channel Impedance Value: 160.941
Lead Channel Impedance Value: 168.889
Lead Channel Impedance Value: 174.595
Lead Channel Impedance Value: 180 Ohm
Lead Channel Impedance Value: 247 Ohm
Lead Channel Impedance Value: 304 Ohm
Lead Channel Impedance Value: 323 Ohm
Lead Channel Impedance Value: 342 Ohm
Lead Channel Impedance Value: 342 Ohm
Lead Channel Impedance Value: 380 Ohm
Lead Channel Impedance Value: 380 Ohm
Lead Channel Impedance Value: 399 Ohm
Lead Channel Impedance Value: 494 Ohm
Lead Channel Impedance Value: 513 Ohm
Lead Channel Impedance Value: 570 Ohm
Lead Channel Impedance Value: 627 Ohm
Lead Channel Impedance Value: 627 Ohm
Lead Channel Pacing Threshold Amplitude: 0.625 V
Lead Channel Pacing Threshold Amplitude: 0.75 V
Lead Channel Pacing Threshold Amplitude: 0.875 V
Lead Channel Pacing Threshold Pulse Width: 0.4 ms
Lead Channel Pacing Threshold Pulse Width: 0.4 ms
Lead Channel Pacing Threshold Pulse Width: 0.4 ms
Lead Channel Sensing Intrinsic Amplitude: 3.25 mV
Lead Channel Sensing Intrinsic Amplitude: 3.25 mV
Lead Channel Sensing Intrinsic Amplitude: 7.125 mV
Lead Channel Sensing Intrinsic Amplitude: 7.125 mV
Lead Channel Setting Pacing Amplitude: 1.25 V
Lead Channel Setting Pacing Amplitude: 1.5 V
Lead Channel Setting Pacing Amplitude: 2 V
Lead Channel Setting Pacing Pulse Width: 0.4 ms
Lead Channel Setting Pacing Pulse Width: 0.4 ms
Lead Channel Setting Sensing Sensitivity: 0.3 mV

## 2021-10-12 NOTE — Progress Notes (Signed)
Remote ICD transmission.   

## 2021-10-14 ENCOUNTER — Telehealth: Payer: Self-pay

## 2021-10-17 ENCOUNTER — Other Ambulatory Visit: Payer: Self-pay | Admitting: *Deleted

## 2021-10-18 ENCOUNTER — Encounter: Payer: Self-pay | Admitting: Internal Medicine

## 2021-10-18 ENCOUNTER — Ambulatory Visit: Payer: Medicare Other | Attending: Internal Medicine | Admitting: Internal Medicine

## 2021-10-18 VITALS — BP 105/70 | HR 73 | Temp 97.7°F | Ht 68.0 in | Wt 180.0 lb

## 2021-10-18 DIAGNOSIS — G7000 Myasthenia gravis without (acute) exacerbation: Secondary | ICD-10-CM | POA: Diagnosis not present

## 2021-10-18 DIAGNOSIS — Z1211 Encounter for screening for malignant neoplasm of colon: Secondary | ICD-10-CM

## 2021-10-18 DIAGNOSIS — R739 Hyperglycemia, unspecified: Secondary | ICD-10-CM

## 2021-10-18 DIAGNOSIS — Z23 Encounter for immunization: Secondary | ICD-10-CM | POA: Diagnosis not present

## 2021-10-18 DIAGNOSIS — D4989 Neoplasm of unspecified behavior of other specified sites: Secondary | ICD-10-CM | POA: Diagnosis not present

## 2021-10-18 DIAGNOSIS — Z7689 Persons encountering health services in other specified circumstances: Secondary | ICD-10-CM | POA: Diagnosis not present

## 2021-10-18 DIAGNOSIS — I255 Ischemic cardiomyopathy: Secondary | ICD-10-CM

## 2021-10-18 DIAGNOSIS — I5022 Chronic systolic (congestive) heart failure: Secondary | ICD-10-CM | POA: Diagnosis not present

## 2021-10-18 NOTE — Progress Notes (Signed)
Patient ID: [name], male    DOB: Aug 28, 1950  MRN: [id number]  CC: Establish Care (Est care / new pt. No questions / concerns. Velta [place] to flu vax. )   Subjective: [name] is a 71 y.o. male who presents for new patient visit. His concerns today include:  Patient with history of ICM (EF 40% 07/2019) with ICD placement, HFrEF, Hx AV block complete, V. tach, myasthenia  No previous PCP  Systolic CHF/ICM/ICD in placed:  sees Dr. Terrence Dupont; last seen 1 mth ago -taking meds consistently.  Confirms medications on current med list that include potassium supplement, sotalol 120 mg daily, carvedilol 3.125 mg twice a day, atorvastatin 40 mg daily, furosemide 60 mg daily, aspirin 81 mg daily, Entresto 49/51 mg twice a day. -no CP/SOB/PND/LE edema/palpitations No recent ICD shocks.   Myasthenia: was being seen at [facility] neurology myasthenia clinic but has not seen them since the [place] pandemic started in 2020 Dr. Lyla Son has been filling Imuran and Prednisone 5 mg every other day since 2020.  HM:  due for flu, Tdapt, PCV 20 vaccines.  Had 2 COVID vaccines. Reports having had c-scope locally several yrs ago with [facility]   Agrees for flu shot only today.  Will come back for PCV.  Patient Active Problem List   Diagnosis Date Noted   Chronic systolic (congestive) heart failure ([place]) 04/09/2020   Hyperlipidemia 04/22/2013   CHF (congestive heart failure) ([place]) 04/22/2013   Cardiac defibrillator in place 04/22/2013   Myasthenia gravis associated with thymoma ([place]) 04/22/2013   Seasonal and perennial allergic rhinitis 09/04/2011   Allergic conjunctivitis 09/04/2011   Midsternal chest pain 08/17/2011   Automatic implantable cardioverter-defibrillator in situ 01/18/2011   FATIGUE 67/12/4578   Chronic systolic CHF (congestive heart failure) ([place]) 05/08/2008   MYASTHENIA GRAVIS WITHOUT EXACERBATION 12/05/2007   CORONARY ATHEROSCLEROSIS NATIVE CORONARY ARTERY 12/05/2007   Ischemic  cardiomyopathy 12/05/2007   AV BLOCK, COMPLETE 12/05/2007   VENTRICULAR TACHYCARDIA 12/05/2007     Current Outpatient Medications on File Prior to Visit  Medication Sig Dispense Refill   acetaminophen (TYLENOL) 325 MG tablet Take 1-2 tablets (325-650 mg total) by mouth every 4 (four) hours as needed for mild pain.     aspirin EC 81 MG tablet Take 1 tablet (81 mg total) by mouth daily. Please don't restart until after your Wound check on 4/22. 30 tablet 11   atorvastatin (LIPITOR) 40 MG tablet Take 40 mg by mouth at bedtime.     azaTHIOprine (IMURAN) 50 MG tablet Take 150 mg by mouth daily.     carvedilol (COREG) 3.125 MG tablet Take 1 tablet (3.125 mg total) by mouth 2 (two) times daily with a meal. Please make overdue appt with Dr. Caryl Comes. 2nd attempt 30 tablet 0   furosemide (LASIX) 40 MG tablet Take 1.5 tablets (60 mg total) by mouth daily. 135 tablet 3   Multiple Vitamin (MULTIVITAMIN) capsule Take 1 capsule by mouth daily.     Nutritional Supplements (GRAPESEED EXTRACT PO) Take 2 capsules by mouth daily.     omeprazole (PRILOSEC) 40 MG capsule Take 40 mg by mouth daily.     OVER THE COUNTER MEDICATION Take 1 tablet by mouth daily. Blue green algae     OVER THE COUNTER MEDICATION Take 2 tablets by mouth daily. Digestive enzymes     Polyvinyl Alcohol-Povidone (REFRESH OP) Place 1 drop into both eyes daily as needed (red eyes).     potassium chloride SA (K-DUR,KLOR-CON) 20 MEQ tablet  TAKE 1 TABLET BY MOUTH  DAILY ( MUST KEEP UPCOMING APPOINTMENT FOR FURTHER REFILLS) 90 tablet 3   predniSONE (DELTASONE) 5 MG tablet Take 5 mg by mouth every other day.     sacubitril-valsartan (ENTRESTO) 49-51 MG Take 1 tablet by mouth 2 (two) times daily.     sotalol (BETAPACE) 120 MG tablet Take 1 tablet by mouth two times daily. Patient is overdue for an appointment and needs to call and schedule for further refills 2nd attempt 30 tablet 0   No current facility-administered medications on file prior to  visit.    Allergies  Allergen Reactions   Procainamide Other (See Comments)    Allergy is from [facility] records - pt is not aware of any reaction to this medicine   Quinidine Other (See Comments)    Allergy is from [facility] records - pt is not aware of any reaction to this medicine    Social History   Socioeconomic History   Marital status: Single    Spouse name: Not on file   Number of children: Not on file   Years of education: Not on file   Highest education level: Not on file  Occupational History   Not on file  Tobacco Use   Smoking status: Former    Packs/day: 0.30    Years: 6.00    Total pack years: 1.80    Types: Cigarettes    Quit date: 01/03/1982    Years since quitting: 39.8    Passive exposure: Never   Smokeless tobacco: Never  Vaping Use   Vaping Use: Never used  Substance and Sexual Activity   Alcohol use: Yes    Comment: occasional   Drug use: No   Sexual activity: Not on file  Other Topics Concern   Not on file  Social History Narrative   Not on file   Social Determinants of Health   Financial Resource Strain: Not on file  Food Insecurity: Not on file  Transportation Needs: Not on file  Physical Activity: Not on file  Stress: Not on file  Social Connections: Not on file  Intimate Partner Violence: Not on file    Family History  Problem Relation Age of Onset   Heart disease Mother    Heart disease Father    Coronary artery disease Brother    Hypertension Brother    Diabetes Neg Hx    Stroke Neg Hx     Past Surgical History:  Procedure Laterality Date   anterior lumbar interbody fusion  04/23/2007   L4-L5    BI-VENTRICULAR IMPLANTABLE CARDIOVERTER DEFIBRILLATOR UPGRADE N/A 04/10/2013   failed CRTD upgrade   BI-VENTRICULAR PACEMAKER UPGRADE N/A 10/14/2013   failed CRTD upgrade   CARDIAC DEFIBRILLATOR PLACEMENT  2001; 2008   single chamber MDT ICD implanted for secondary prevention; gen change 2008; explantation 2009    CORONARY ANGIOPLASTY WITH STENT PLACEMENT     bare metal stenting of the LAD in [place] (ICD) GENERATOR CHANGE N/A 04/09/2020   Procedure: MEDTRONIC BIV;ONE GEN CHANGE; EXTRACT RIGHT ATRIAL LEAD; REIMPLANT RIGHT ATRIAL LEAD;ADD LEFT BUNDLE PACING LEAD;  Surgeon: Evans Lance, MD;  Location: [facility];  Service: Cardiovascular;  Laterality: N/A;   IMPLANTABLE CARDIOVERTER DEFIBRILLATOR IMPLANT  2009; 2015   MDT dual chamber right sided device implanted 2009 with gen change 2015; unable to place LV lead    ROS: Review of Systems Negative except as stated above  PHYSICAL EXAM: BP 105/70 (BP Location: Left Arm, Patient Position: Sitting, Cuff Size: Normal)   Pulse 73   Temp 97.7 F (36.5 C) (Oral)   Ht '5\' 8"'$  (1.727 m)   Wt 180 lb (81.6 kg)   SpO2 96%   BMI 27.37 kg/m   Wt Readings from Last 3 Encounters:  10/18/21 180 lb (81.6 kg)  02/27/21 182 lb (82.6 kg)  02/18/21 184 lb 9.6 oz (83.7 kg)    Physical Exam  General appearance - alert, well appearing, elderly African-American male and in no distress Mental status - normal mood, behavior, speech, dress, motor activity, and thought processes Eyes - pupils equal and reactive, extraocular eye movements intact Neck - supple, no significant adenopathy Chest - clear to auscultation, no wheezes, rales or rhonchi, symmetric air entry Heart - normal rate, regular rhythm, normal S1, S2, no murmurs, rubs, clicks or gallops Extremities - peripheral pulses normal, no pedal edema, no clubbing or cyanosis      Latest Ref Rng & Units 04/03/2020    8:53 AM 12/25/2019    2:29 PM 07/03/2019    9:13 AM  CMP  Glucose 65 - 99 mg/dL 115  87  115   BUN 8 - 27 mg/dL '10  11  14   '$ Creatinine 0.76 - 1.27 mg/dL 1.03  0.96  1.01   Sodium 134 - 144 mmol/L 139  142  138   Potassium 3.5 - 5.2 mmol/L 4.3  4.4  4.3   Chloride 96 - 106 mmol/L 100  103  99   CO2 20 - 29 mmol/L '25  29  25   '$ Calcium  8.6 - 10.2 mg/dL 9.3  9.1  9.3    Lipid Panel     Component Value Date/Time   CHOL 149 10/18/2017 1035   TRIG 62 10/18/2017 1035   HDL 51 10/18/2017 1035   CHOLHDL 2.9 10/18/2017 1035   CHOLHDL 2.6 08/14/2015 0836   VLDL 18 08/14/2015 0836   LDLCALC 86 10/18/2017 1035    CBC    Component Value Date/Time   WBC 4.9 04/03/2020 0853   WBC 5.9 07/08/2014 1330   RBC 4.47 04/03/2020 0853   RBC 4.19 (L) 07/08/2014 1330   HGB 14.1 04/03/2020 0853   HCT 41.9 04/03/2020 0853   PLT 206 04/03/2020 0853   MCV 94 04/03/2020 0853   MCH 31.5 04/03/2020 0853   MCH 31.3 07/08/2014 1330   MCHC 33.7 04/03/2020 0853   MCHC 32.8 07/08/2014 1330   RDW 12.2 04/03/2020 0853   LYMPHSABS 1.7 04/03/2020 0853   MONOABS 0.4 02/17/2014 1240   EOSABS 0.2 04/03/2020 0853   BASOSABS 0.1 04/03/2020 0853    ASSESSMENT AND PLAN:  1. Establishing care with new doctor, encounter for   2. Chronic systolic heart failure ([place]) 3. Ischemic cardiomyopathy Patient is clinically stable and compensated.  He will continue his current medications including Entresto, aspirin, atorvastatin, carvedilol, furosemide and potassium.  I specifically asked just to verify whether he was on sotalol and carvedilol and patient stated that he is. Try to limit salt in the foods. Continue follow-up with cardiology. - CBC - Comprehensive metabolic panel - Lipid panel  4. Myasthenia gravis associated with thymoma [facility]) I will refer him to neurology.  Patient prefers to go back to [facility] myasthenia gravis clinic.  Stressed the importance of follow-up given that he is on Imuran and prednisone.  He reports no recent flare of his myasthenia. We will check baseline labs today including  CBC and metabolic panel given that he is on Imuran. - Ambulatory referral to Neurology  5. Need for influenza vaccination - Flu Vaccine QUAD High Dose(Fluad)  6. Screening for colon cancer We will have him sign a release to get colonoscopy report  from [facility] gastroenterology.   Addendum 10/19/2021: Patient with increased glucose on chemistry.  We will check A1c. Patient was given the opportunity to ask questions.  Patient verbalized understanding of the plan and was able to repeat key elements of the plan.   This documentation was completed using Radio producer.  Any transcriptional errors are unintentional.  No orders of the defined types were placed in this encounter.    Requested Prescriptions    No prescriptions requested or ordered in this encounter    No follow-ups on file.  Karle Plumber, MD, FACP

## 2021-10-18 NOTE — Patient Instructions (Addendum)
I have submitted a referral for you to follow-up with [place] neurology for the myasthenia gravis.  I encourage you to get your COVID-19 vaccine booster from any outside pharmacy.  You can also get the pneumonia vaccine from any outside pharmacy since you did not get it today.  Please sign a release for [place] to get your records of the colonoscopy report from [facility] gastroenterology.  You can sign this release at the checkout desk.

## 2021-10-19 LAB — CBC
Hematocrit: 43.3 % (ref 37.5–51.0)
Hemoglobin: 14.2 g/dL (ref 13.0–17.7)
MCH: 31.6 pg (ref 26.6–33.0)
MCHC: 32.8 g/dL (ref 31.5–35.7)
MCV: 96 fL (ref 79–97)
Platelets: 208 10*3/uL (ref 150–450)
RBC: 4.49 x10E6/uL (ref 4.14–5.80)
RDW: 12.3 % (ref 11.6–15.4)
WBC: 5.1 10*3/uL (ref 3.4–10.8)

## 2021-10-19 LAB — LIPID PANEL
Chol/HDL Ratio: 3.5 ratio (ref 0.0–5.0)
Cholesterol, Total: 174 mg/dL (ref 100–199)
HDL: 50 mg/dL (ref >39)
LDL Chol Calc (NIH): 108 mg/dL — ABNORMAL HIGH (ref 0–99)
Triglycerides: 87 mg/dL (ref 0–149)
VLDL Cholesterol Cal: 16 mg/dL (ref 5–40)

## 2021-10-19 LAB — COMPREHENSIVE METABOLIC PANEL
ALT: 18 IU/L (ref 0–44)
AST: 21 IU/L (ref 0–40)
Albumin/Globulin Ratio: 1.4 (ref 1.2–2.2)
Albumin: 4.4 g/dL (ref 3.8–4.8)
Alkaline Phosphatase: 103 IU/L (ref 44–121)
BUN/Creatinine Ratio: 16 (ref 10–24)
BUN: 16 mg/dL (ref 8–27)
Bilirubin Total: 0.3 mg/dL (ref 0.0–1.2)
CO2: 25 mmol/L (ref 20–29)
Calcium: 9.2 mg/dL (ref 8.6–10.2)
Chloride: 101 mmol/L (ref 96–106)
Creatinine, Ser: 1.02 mg/dL (ref 0.76–1.27)
Globulin, Total: 3.2 g/dL (ref 1.5–4.5)
Glucose: 117 mg/dL — ABNORMAL HIGH (ref 70–99)
Potassium: 4.6 mmol/L (ref 3.5–5.2)
Sodium: 140 mmol/L (ref 134–144)
Total Protein: 7.6 g/dL (ref 6.0–8.5)
eGFR: 78 mL/min/{1.73_m2} (ref >59)

## 2021-10-19 NOTE — Addendum Note (Signed)
Addended by: Karle Plumber B on: 10/19/2021 10:18 PM   Modules accepted: Orders

## 2021-11-02 LAB — SPECIMEN STATUS REPORT

## 2021-11-23 ENCOUNTER — Encounter: Payer: Medicare Other | Admitting: Pharmacist

## 2021-12-30 ENCOUNTER — Encounter: Payer: Medicare Other | Admitting: Pharmacist

## 2022-01-07 ENCOUNTER — Ambulatory Visit (INDEPENDENT_AMBULATORY_CARE_PROVIDER_SITE_OTHER): Payer: 59

## 2022-01-07 DIAGNOSIS — I255 Ischemic cardiomyopathy: Secondary | ICD-10-CM

## 2022-01-07 LAB — CUP PACEART REMOTE DEVICE CHECK
Battery Remaining Longevity: 77 mo
Battery Voltage: 2.99 V
Brady Statistic AP VP Percent: 2.64 %
Brady Statistic AP VS Percent: 0.01 %
Brady Statistic AS VP Percent: 97.35 %
Brady Statistic AS VS Percent: 0 %
Brady Statistic RA Percent Paced: 2.62 %
Brady Statistic RV Percent Paced: 99.98 %
Date Time Interrogation Session: 20240105033526
HighPow Impedance: 42 Ohm
HighPow Impedance: 48 Ohm
Implantable Lead Connection Status: 753985
Implantable Lead Connection Status: 753985
Implantable Lead Connection Status: 753985
Implantable Lead Implant Date: 20090619
Implantable Lead Implant Date: 20220407
Implantable Lead Implant Date: 20220407
Implantable Lead Location: 753858
Implantable Lead Location: 753859
Implantable Lead Location: 753860
Implantable Lead Model: 4398
Implantable Lead Model: 5076
Implantable Lead Model: 7120
Implantable Pulse Generator Implant Date: 20220407
Lead Channel Impedance Value: 145.871
Lead Channel Impedance Value: 149.625
Lead Channel Impedance Value: 156.471
Lead Channel Impedance Value: 174.595
Lead Channel Impedance Value: 180 Ohm
Lead Channel Impedance Value: 247 Ohm
Lead Channel Impedance Value: 266 Ohm
Lead Channel Impedance Value: 323 Ohm
Lead Channel Impedance Value: 342 Ohm
Lead Channel Impedance Value: 380 Ohm
Lead Channel Impedance Value: 380 Ohm
Lead Channel Impedance Value: 399 Ohm
Lead Channel Impedance Value: 437 Ohm
Lead Channel Impedance Value: 494 Ohm
Lead Channel Impedance Value: 513 Ohm
Lead Channel Impedance Value: 570 Ohm
Lead Channel Impedance Value: 589 Ohm
Lead Channel Impedance Value: 627 Ohm
Lead Channel Pacing Threshold Amplitude: 0.75 V
Lead Channel Pacing Threshold Amplitude: 0.75 V
Lead Channel Pacing Threshold Amplitude: 1 V
Lead Channel Pacing Threshold Pulse Width: 0.4 ms
Lead Channel Pacing Threshold Pulse Width: 0.4 ms
Lead Channel Pacing Threshold Pulse Width: 0.4 ms
Lead Channel Sensing Intrinsic Amplitude: 3.5 mV
Lead Channel Sensing Intrinsic Amplitude: 3.5 mV
Lead Channel Sensing Intrinsic Amplitude: 7.125 mV
Lead Channel Sensing Intrinsic Amplitude: 7.125 mV
Lead Channel Setting Pacing Amplitude: 1.25 V
Lead Channel Setting Pacing Amplitude: 1.5 V
Lead Channel Setting Pacing Amplitude: 2 V
Lead Channel Setting Pacing Pulse Width: 0.4 ms
Lead Channel Setting Pacing Pulse Width: 0.4 ms
Lead Channel Setting Sensing Sensitivity: 0.3 mV
Zone Setting Status: 755011
Zone Setting Status: 755011

## 2022-01-25 NOTE — Progress Notes (Signed)
Remote ICD transmission.   

## 2022-02-18 ENCOUNTER — Ambulatory Visit: Payer: 59 | Attending: Internal Medicine | Admitting: Internal Medicine

## 2022-02-18 ENCOUNTER — Encounter: Payer: Self-pay | Admitting: Internal Medicine

## 2022-02-18 VITALS — BP 102/64 | HR 80 | Temp 98.0°F | Ht 68.0 in | Wt 176.0 lb

## 2022-02-18 DIAGNOSIS — I442 Atrioventricular block, complete: Secondary | ICD-10-CM

## 2022-02-18 DIAGNOSIS — Z2821 Immunization not carried out because of patient refusal: Secondary | ICD-10-CM

## 2022-02-18 DIAGNOSIS — R739 Hyperglycemia, unspecified: Secondary | ICD-10-CM | POA: Diagnosis not present

## 2022-02-18 DIAGNOSIS — I5022 Chronic systolic (congestive) heart failure: Secondary | ICD-10-CM

## 2022-02-18 DIAGNOSIS — D4989 Neoplasm of unspecified behavior of other specified sites: Secondary | ICD-10-CM | POA: Diagnosis not present

## 2022-02-18 DIAGNOSIS — G7000 Myasthenia gravis without (acute) exacerbation: Secondary | ICD-10-CM | POA: Diagnosis not present

## 2022-02-18 DIAGNOSIS — E663 Overweight: Secondary | ICD-10-CM

## 2022-02-18 NOTE — Progress Notes (Signed)
Patient ID: [name], male    DOB: 08-22-1950  MRN: [id number]  CC: Heart Problem (Chronic Systolic heart failure f/u. Neoma Laming received flu vax. )   Subjective: [name] is a 72 y.o. male who presents for chronic ds managemen His concerns today include:  Patient with history of ICM (EF 40% 07/2019) with ICD placement, HFrEF, Hx AV block complete, V. tach, myasthenia   MG: referred to [facility] on last visit. Received letter but did not go because he had a cold at the time.  He plans to call them early next week to schedule an appointment.  Still on Imuran and prednisone 5 mg daily being refilled by his cardiologist Dr. Terrence Dupont.   Still gets a little bit of phlegm drainage at the back of the throat from his cold several weeks ago.  Systolic CHF/ICM/ICD in placed: sees Dr. Terrence Dupont; has not seen him since last visit with me in Oct 2023.   -no CP/SOB/PND/LE edema/palpitations.  History of AV block.  Reports no syncopal episodes.  He has an ICD No recent ICD shocks.  Compliant with atorvastatin 40 mg daily carvedilol 3.125 mg twice a day, sotalol, furosemide 60 mg daily, potassium supplement and Entresto 49/51 1 tablet twice a day.  Does well with his eating habits.  Loves fruits and vegetables and eats them daily.  Stays active by bowling 1-3 times a week and walking once a week.  BMI is in the range for overweight.  Blood sugar mildly elevated on last 2 chemistries.  HM:  declines PCV vaccine.  Thinks he has had Tdapt within past 10 yrs.   Patient Active Problem List   Diagnosis Date Noted   Chronic systolic (congestive) heart failure ([place]) 04/09/2020   Hyperlipidemia 04/22/2013   CHF (congestive heart failure) ([place]) 04/22/2013   Cardiac defibrillator in place 04/22/2013   Myasthenia gravis associated with thymoma ([place]) 04/22/2013   Seasonal and perennial allergic rhinitis 09/04/2011   Allergic conjunctivitis 09/04/2011   Midsternal chest pain 08/17/2011    Automatic implantable cardioverter-defibrillator in situ 01/18/2011   FATIGUE [id number]   Chronic systolic CHF (congestive heart failure) ([place]) 05/08/2008   MYASTHENIA GRAVIS WITHOUT EXACERBATION 12/05/2007   CORONARY ATHEROSCLEROSIS NATIVE CORONARY ARTERY 12/05/2007   Ischemic cardiomyopathy 12/05/2007   AV BLOCK, COMPLETE 12/05/2007   VENTRICULAR TACHYCARDIA 12/05/2007     Current Outpatient Medications on File Prior to Visit  Medication Sig Dispense Refill   acetaminophen (TYLENOL) 325 MG tablet Take 1-2 tablets (325-650 mg total) by mouth every 4 (four) hours as needed for mild pain.     aspirin EC 81 MG tablet Take 1 tablet (81 mg total) by mouth daily. Please don't restart until after your Wound check on 4/22. 30 tablet 11   atorvastatin (LIPITOR) 40 MG tablet Take 40 mg by mouth at bedtime.     azaTHIOprine (IMURAN) 50 MG tablet Take 150 mg by mouth daily.     carvedilol (COREG) 3.125 MG tablet Take 1 tablet (3.125 mg total) by mouth 2 (two) times daily with a meal. Please make overdue appt with Dr. Caryl Comes. 2nd attempt 30 tablet 0   furosemide (LASIX) 40 MG tablet Take 1.5 tablets (60 mg total) by mouth daily. 135 tablet 3   Multiple Vitamin (MULTIVITAMIN) capsule Take 1 capsule by mouth daily.     Nutritional Supplements (GRAPESEED EXTRACT PO) Take 2 capsules by mouth daily.     omeprazole (PRILOSEC) 40 MG capsule Take 40 mg by mouth daily.  OVER THE COUNTER MEDICATION Take 1 tablet by mouth daily. Blue green algae     OVER THE COUNTER MEDICATION Take 2 tablets by mouth daily. Digestive enzymes     Polyvinyl Alcohol-Povidone (REFRESH OP) Place 1 drop into both eyes daily as needed (red eyes).     potassium chloride SA (K-DUR,KLOR-CON) 20 MEQ tablet TAKE 1 TABLET BY MOUTH  DAILY ( MUST KEEP UPCOMING APPOINTMENT FOR FURTHER REFILLS) 90 tablet 3   predniSONE (DELTASONE) 5 MG tablet Take 5 mg by mouth every other day.     sacubitril-valsartan (ENTRESTO) 49-51 MG Take 1 tablet by  mouth 2 (two) times daily.     sotalol (BETAPACE) 120 MG tablet Take 1 tablet by mouth two times daily. Patient is overdue for an appointment and needs to call and schedule for further refills 2nd attempt 30 tablet 0   No current facility-administered medications on file prior to visit.    Allergies  Allergen Reactions   Procainamide Other (See Comments)    Allergy is from [facility] records - pt is not aware of any reaction to this medicine   Quinidine Other (See Comments)    Allergy is from [facility] records - pt is not aware of any reaction to this medicine    Social History   Socioeconomic History   Marital status: Single    Spouse name: Not on file   Number of children: Not on file   Years of education: Not on file   Highest education level: Not on file  Occupational History   Not on file  Tobacco Use   Smoking status: Former    Packs/day: 0.30    Years: 6.00    Total pack years: 1.80    Types: Cigarettes    Quit date: 01/03/1982    Years since quitting: 40.1    Passive exposure: Never   Smokeless tobacco: Never  Vaping Use   Vaping Use: Never used  Substance and Sexual Activity   Alcohol use: Yes    Comment: occasional   Drug use: No   Sexual activity: Not on file  Other Topics Concern   Not on file  Social History Narrative   Not on file   Social Determinants of Health   Financial Resource Strain: Not on file  Food Insecurity: Not on file  Transportation Needs: Not on file  Physical Activity: Not on file  Stress: Not on file  Social Connections: Not on file  Intimate Partner Violence: Not on file    Family History  Problem Relation Age of Onset   Heart disease Mother    Heart disease Father    Coronary artery disease Brother    Hypertension Brother    Diabetes Neg Hx    Stroke Neg Hx     Past Surgical History:  Procedure Laterality Date   anterior lumbar interbody fusion  04/23/2007   L4-L5    BI-VENTRICULAR IMPLANTABLE CARDIOVERTER  DEFIBRILLATOR UPGRADE N/A 04/10/2013   failed CRTD upgrade   BI-VENTRICULAR PACEMAKER UPGRADE N/A 10/14/2013   failed CRTD upgrade   CARDIAC DEFIBRILLATOR PLACEMENT  2001; 2008   single chamber MDT ICD implanted for secondary prevention; gen change 2008; explantation 2009   CORONARY ANGIOPLASTY WITH STENT PLACEMENT     bare metal stenting of the LAD in [place] (ICD) GENERATOR CHANGE N/A 04/09/2020   Procedure: MEDTRONIC BIV;ONE GEN CHANGE; EXTRACT RIGHT ATRIAL LEAD; REIMPLANT RIGHT ATRIAL LEAD;ADD LEFT BUNDLE  PACING LEAD;  Surgeon: Evans Lance, MD;  Location: [facility] OR;  Service: Cardiovascular;  Laterality: N/A;   IMPLANTABLE CARDIOVERTER DEFIBRILLATOR IMPLANT  2009; 2015   MDT dual chamber right sided device implanted 2009 with gen change 2015; unable to place LV lead    ROS: Review of Systems Negative except as stated above  PHYSICAL EXAM: BP 102/64 (BP Location: Left Arm, Patient Position: Sitting, Cuff Size: Normal)   Pulse 80   Temp 98 F (36.7 C) (Oral)   Ht 5' 8"$  (1.727 m)   Wt 176 lb (79.8 kg)   SpO2 97%   BMI 26.76 kg/m   Wt Readings from Last 3 Encounters:  02/18/22 176 lb (79.8 kg)  10/18/21 180 lb (81.6 kg)  02/27/21 182 lb (82.6 kg)    Physical Exam   General appearance - alert, well appearing, elderly African-American male and in no distress Mental status - normal mood, behavior, speech, dress, motor activity, and thought processes Chest - clear to auscultation, no wheezes, rales or rhonchi, symmetric air entry Heart - normal rate, regular rhythm, normal S1, S2, no murmurs, rubs, clicks or gallops Extremities - peripheral pulses normal, no pedal edema, no clubbing or cyanosis     Latest Ref Rng & Units 10/18/2021   10:30 AM 04/03/2020    8:53 AM 12/25/2019    2:29 PM  CMP  Glucose 70 - 99 mg/dL 117  115  87   BUN 8 - 27 mg/dL 16  10  11   $ Creatinine 0.76 - 1.27 mg/dL 1.02  1.03  0.96   Sodium  134 - 144 mmol/L 140  139  142   Potassium 3.5 - 5.2 mmol/L 4.6  4.3  4.4   Chloride 96 - 106 mmol/L 101  100  103   CO2 20 - 29 mmol/L 25  25  29   $ Calcium 8.6 - 10.2 mg/dL 9.2  9.3  9.1   Total Protein 6.0 - 8.5 g/dL 7.6     Total Bilirubin 0.0 - 1.2 mg/dL 0.3     Alkaline Phos 44 - 121 IU/L 103     AST 0 - 40 IU/L 21     ALT 0 - 44 IU/L 18      Lipid Panel     Component Value Date/Time   CHOL 174 10/18/2021 1030   TRIG 87 10/18/2021 1030   HDL 50 10/18/2021 1030   CHOLHDL 3.5 10/18/2021 1030   CHOLHDL 2.6 08/14/2015 0836   VLDL 18 08/14/2015 0836   LDLCALC 108 (H) 10/18/2021 1030    CBC    Component Value Date/Time   WBC 5.1 10/18/2021 1030   WBC 5.9 07/08/2014 1330   RBC 4.49 10/18/2021 1030   RBC 4.19 (L) 07/08/2014 1330   HGB 14.2 10/18/2021 1030   HCT 43.3 10/18/2021 1030   PLT 208 10/18/2021 1030   MCV 96 10/18/2021 1030   MCH 31.6 10/18/2021 1030   MCH 31.3 07/08/2014 1330   MCHC 32.8 10/18/2021 1030   MCHC 32.8 07/08/2014 1330   RDW 12.3 10/18/2021 1030   LYMPHSABS 1.7 04/03/2020 0853   MONOABS 0.4 02/17/2014 1240   EOSABS 0.2 04/03/2020 0853   BASOSABS 0.1 04/03/2020 0853    ASSESSMENT AND PLAN:  1. Chronic systolic heart failure (HCC) Stable and compensated.  He will continue Entresto, furosemide, carvedilol As prescribed by his cardiologist. 2. Myasthenia gravis associated with thymoma [facility]) Encourage patient to call [place] neurology clinic to schedule an appointment.  He  promised to do so next week. - CBC - Comprehensive metabolic panel  3. Atrioventricular block, complete (HCC) No further history.  Stable on his current heart medications listed above.  4. Hyperglycemia We will check A1c to screen for diabetes. - Hemoglobin A1c  5. Overweight (BMI 25.0-29.9) Commended him on healthy eating habits.  Encouraged him to keep up the good works.  Encouraged him to continue to remain active.  6. Pneumococcal vaccination declined Recommended.   Patient declined.    Patient was given the opportunity to ask questions.  Patient verbalized understanding of the plan and was able to repeat key elements of the plan.   This documentation was completed using Radio producer.  Any transcriptional errors are unintentional.  No orders of the defined types were placed in this encounter.    Requested Prescriptions    No prescriptions requested or ordered in this encounter    No follow-ups on file.  Karle Plumber, MD, FACP

## 2022-02-19 ENCOUNTER — Encounter: Payer: Self-pay | Admitting: Internal Medicine

## 2022-02-19 DIAGNOSIS — R7303 Prediabetes: Secondary | ICD-10-CM | POA: Insufficient documentation

## 2022-02-19 LAB — COMPREHENSIVE METABOLIC PANEL
ALT: 16 IU/L (ref 0–44)
AST: 23 IU/L (ref 0–40)
Albumin/Globulin Ratio: 1.4 (ref 1.2–2.2)
Albumin: 4.2 g/dL (ref 3.8–4.8)
Alkaline Phosphatase: 88 IU/L (ref 44–121)
BUN/Creatinine Ratio: 15 (ref 10–24)
BUN: 14 mg/dL (ref 8–27)
Bilirubin Total: 0.4 mg/dL (ref 0.0–1.2)
CO2: 24 mmol/L (ref 20–29)
Calcium: 8.5 mg/dL — ABNORMAL LOW (ref 8.6–10.2)
Chloride: 102 mmol/L (ref 96–106)
Creatinine, Ser: 0.95 mg/dL (ref 0.76–1.27)
Globulin, Total: 3 g/dL (ref 1.5–4.5)
Glucose: 102 mg/dL — ABNORMAL HIGH (ref 70–99)
Potassium: 4.3 mmol/L (ref 3.5–5.2)
Sodium: 139 mmol/L (ref 134–144)
Total Protein: 7.2 g/dL (ref 6.0–8.5)
eGFR: 85 mL/min/{1.73_m2} (ref >59)

## 2022-02-19 LAB — HEMOGLOBIN A1C
Est. average glucose Bld gHb Est-mCnc: 134 mg/dL
Hgb A1c MFr Bld: 6.3 % — ABNORMAL HIGH (ref 4.8–5.6)

## 2022-02-19 LAB — CBC
Hematocrit: 39.2 % (ref 37.5–51.0)
Hemoglobin: 13.2 g/dL (ref 13.0–17.7)
MCH: 31.2 pg (ref 26.6–33.0)
MCHC: 33.7 g/dL (ref 31.5–35.7)
MCV: 93 fL (ref 79–97)
Platelets: 209 10*3/uL (ref 150–450)
RBC: 4.23 x10E6/uL (ref 4.14–5.80)
RDW: 12.3 % (ref 11.6–15.4)
WBC: 5.6 10*3/uL (ref 3.4–10.8)

## 2022-02-19 NOTE — Progress Notes (Signed)
Let patient know that his kidney and liver function tests are good. Calcium level is a little low.  This is likely due to vitamin D deficiency.  Taking vitamin D 400 IU 3 to 4 days a week would help.  This can be purchased over-the-counter.  He is in the range for prediabetes.  Healthy eating habits and regular exercise will help prevent progression to full diabetes.  Let me know if he would like to be referred to a nutritionist for counseling.  Blood cell counts are normal.

## 2022-02-22 ENCOUNTER — Other Ambulatory Visit: Payer: Self-pay | Admitting: Internal Medicine

## 2022-02-22 DIAGNOSIS — R7303 Prediabetes: Secondary | ICD-10-CM

## 2022-03-04 ENCOUNTER — Ambulatory Visit: Payer: 59 | Attending: Internal Medicine

## 2022-03-04 DIAGNOSIS — Z0000 Encounter for general adult medical examination without abnormal findings: Secondary | ICD-10-CM

## 2022-03-04 NOTE — Patient Instructions (Addendum)
[name] , Thank you for taking time to come for your Medicare Wellness Visit. I appreciate your ongoing commitment to your health goals. Please review the following plan we discussed and let me know if I can assist you in the future.   These are the goals we discussed:  Goals   None     This is a list of the screening recommended for you and due dates:  Health Maintenance  Topic Date Due   Hepatitis C Screening: USPSTF Recommendation to screen - Ages 53-79 yo.  Never done   COVID-19 Vaccine (1) 03/20/2022*   Colon Cancer Screening  05/10/2022   Medicare Annual Wellness Visit  03/04/2023   Pneumonia Vaccine  Completed   Flu Shot  Completed   Zoster (Shingles) Vaccine  Completed   HPV Vaccine  Aged Out   DTaP/Tdap/Td vaccine  Discontinued  *Topic was postponed. The date shown is not the original due date.   Health Maintenance After Age 50 After age 49, you are at a higher risk for certain long-term diseases and infections as well as injuries from falls. Falls are a major cause of broken bones and head injuries in people who are older than age 62. Getting regular preventive care can help to keep you healthy and well. Preventive care includes getting regular testing and making lifestyle changes as recommended by your health care provider. Talk with your health care provider about: Which screenings and tests you should have. A screening is a test that checks for a disease when you have no symptoms. A diet and exercise plan that is right for you. What should I know about screenings and tests to prevent falls? Screening and testing are the best ways to find a health problem early. Early diagnosis and treatment give you the best chance of managing medical conditions that are common after age 34. Certain conditions and lifestyle choices may make you more likely to have a fall. Your health care provider may recommend: Regular vision checks. Poor vision and conditions such as cataracts can  make you more likely to have a fall. If you wear glasses, make sure to get your prescription updated if your vision changes. Medicine review. Work with your health care provider to regularly review all of the medicines you are taking, including over-the-counter medicines. Ask your health care provider about any side effects that may make you more likely to have a fall. Tell your health care provider if any medicines that you take make you feel dizzy or sleepy. Strength and balance checks. Your health care provider may recommend certain tests to check your strength and balance while standing, walking, or changing positions. Foot health exam. Foot pain and numbness, as well as not wearing proper footwear, can make you more likely to have a fall. Screenings, including: Osteoporosis screening. Osteoporosis is a condition that causes the bones to get weaker and break more easily. Blood pressure screening. Blood pressure changes and medicines to control blood pressure can make you feel dizzy. Depression screening. You may be more likely to have a fall if you have a fear of falling, feel depressed, or feel unable to do activities that you used to do. Alcohol use screening. Using too much alcohol can affect your balance and may make you more likely to have a fall. Follow these instructions at home: Lifestyle Do not drink alcohol if: Your health care provider tells you not to drink. If you drink alcohol: Limit how much you have to: 0-1 drink a  day for women. 0-2 drinks a day for men. Know how much alcohol is in your drink. In the [place] one drink equals one 12 oz bottle of beer (355 mL), one 5 oz glass of wine (148 mL), or one 1 oz glass of hard liquor (44 mL). Do not use any products that contain nicotine or tobacco. These products include cigarettes, chewing tobacco, and vaping devices, such as e-cigarettes. If you need help quitting, ask your health care provider. Activity  Follow a regular exercise  program to stay fit. This will help you maintain your balance. Ask your health care provider what types of exercise are appropriate for you. If you need a cane or walker, use it as recommended by your health care provider. Wear supportive shoes that have nonskid soles. Safety  Remove any tripping hazards, such as rugs, cords, and clutter. Install safety equipment such as grab bars in bathrooms and safety rails on stairs. Keep rooms and walkways well-lit. General instructions Talk with your health care provider about your risks for falling. Tell your health care provider if: You fall. Be sure to tell your health care provider about all falls, even ones that seem minor. You feel dizzy, tiredness (fatigue), or off-balance. Take over-the-counter and prescription medicines only as told by your health care provider. These include supplements. Eat a healthy diet and maintain a healthy weight. A healthy diet includes low-fat dairy products, low-fat (lean) meats, and fiber from whole grains, beans, and lots of fruits and vegetables. Stay current with your vaccines. Schedule regular health, dental, and eye exams. Summary Having a healthy lifestyle and getting preventive care can help to protect your health and wellness after age 27. Screening and testing are the best way to find a health problem early and help you avoid having a fall. Early diagnosis and treatment give you the best chance for managing medical conditions that are more common for people who are older than age 74. Falls are a major cause of broken bones and head injuries in people who are older than age 59. Take precautions to prevent a fall at home. Work with your health care provider to learn what changes you can make to improve your health and wellness and to prevent falls. This information is not intended to replace advice given to you by your health care provider. Make sure you discuss any questions you have with your health care  provider. Document Revised: 05/11/2020 Document Reviewed: 05/11/2020 Elsevier Patient Education  [place].

## 2022-03-04 NOTE — Progress Notes (Signed)
Subjective:   [name] is a 72 y.o. male who presents for Medicare Annual/Subsequent preventive examination.  Review of Systems     I connected with [name] on 03/04/2022 at 10:00 am by telephone and verified that I am speaking with the correct person using two identifiers. I discussed the limitations, risks, security and privacy concerns of performing an evaluation and management service by telephone and the availability of in person appointments. I also discussed with the patient that there may be a patient responsible charge related to this service. The patient expressed understanding and agreed to proceed.   Patient location: Home My Location: [place] on the telephone call: Myself and [name]     Cardiac Risk Factors include: none     Objective:    Today's Vitals   03/04/22 1007  PainSc: 6    There is no height or weight on file to calculate BMI.     03/04/2022   10:14 AM 02/27/2021    7:49 PM 04/09/2020    8:00 PM 04/09/2020   12:21 PM 04/06/2020    1:03 PM 10/14/2013    6:09 AM 04/10/2013    9:52 AM  Advanced Directives  Does Patient Have a Medical Advance Directive? No No No No No No Patient does not have advance directive  Does patient want to make changes to medical advance directive?      No - Patient declined   Would patient like information on creating a medical advance directive? Yes (ED - Information included in AVS)  No - Patient declined  No - Patient declined No - patient declined information     Current Medications (verified) Outpatient Encounter Medications as of 03/04/2022  Medication Sig   acetaminophen (TYLENOL) 325 MG tablet Take 1-2 tablets (325-650 mg total) by mouth every 4 (four) hours as needed for mild pain.   aspirin EC 81 MG tablet Take 1 tablet (81 mg total) by mouth daily. Please don't restart until after your Wound check on 4/22.   atorvastatin (LIPITOR) 40 MG tablet Take 40 mg by mouth at bedtime.    azaTHIOprine (IMURAN) 50 MG tablet Take 150 mg by mouth daily.   carvedilol (COREG) 3.125 MG tablet Take 1 tablet (3.125 mg total) by mouth 2 (two) times daily with a meal. Please make overdue appt with Dr. Caryl Comes. 2nd attempt   furosemide (LASIX) 40 MG tablet Take 1.5 tablets (60 mg total) by mouth daily.   Multiple Vitamin (MULTIVITAMIN) capsule Take 1 capsule by mouth daily.   Nutritional Supplements (GRAPESEED EXTRACT PO) Take 2 capsules by mouth daily.   omeprazole (PRILOSEC) 40 MG capsule Take 40 mg by mouth daily.   OVER THE COUNTER MEDICATION Take 1 tablet by mouth daily. Blue green algae   OVER THE COUNTER MEDICATION Take 2 tablets by mouth daily. Digestive enzymes   Polyvinyl Alcohol-Povidone (REFRESH OP) Place 1 drop into both eyes daily as needed (red eyes).   potassium chloride SA (K-DUR,KLOR-CON) 20 MEQ tablet TAKE 1 TABLET BY MOUTH  DAILY ( MUST KEEP UPCOMING APPOINTMENT FOR FURTHER REFILLS)   predniSONE (DELTASONE) 5 MG tablet Take 5 mg by mouth every other day.   sacubitril-valsartan (ENTRESTO) 49-51 MG Take 1 tablet by mouth 2 (two) times daily.   sotalol (BETAPACE) 120 MG tablet Take 1 tablet by mouth two times daily. Patient is overdue for an appointment and needs to call and schedule for further refills 2nd attempt   No facility-administered encounter medications  on file as of 03/04/2022.    Allergies (verified) Procainamide and Quinidine   History: Past Medical History:  Diagnosis Date   Automatic implantable cardioverter-defibrillator in situ 01/18/2011   AV BLOCK, COMPLETE 12/05/2007   Qualifier: Diagnosis of  By: Caryl Comes, MD, [facility], Mack Guise    CAD (coronary artery disease)    a. s/p PCI/BMS of prox LAD in 1997;  b. 4/10 LHC: nonobs dzs. (pRCA 50-70%);  c.   Myoview 08/23/11 : Scar with peri-infarct ischemia infecting the entire septum with a corresponding wall motion abnormality, moderate risk scan, EF 42%.   Cardiac defibrillator in place 04/22/2013   CHF  (congestive heart failure) ([place]) 04/22/2013   Chronic back pain    a. s/p multiple lumbar surgeries   Chronic systolic CHF (congestive heart failure) ([place]) 05/08/2008   Qualifier: Diagnosis of  By: Caryl Comes, MD, [facility] Blake    Complete heart block [facility])    a. h/o transient complete heart block in April 2009   CORONARY ATHEROSCLEROSIS NATIVE CORONARY ARTERY 12/05/2007   Qualifier: Diagnosis of  By: Caryl Comes, MD, Remus Blake    DJD (degenerative joint disease)    GERD (gastroesophageal reflux disease)    Hyperlipidemia    Hypertension    Ischemic cardiomyopathy    a. 05/2009 Echo: EF 40%, mild to mod glob HK, Gr 2 dd, mild LVH, Triv AI, Mild MR, PASP 37-79mHg   Midsternal chest pain 08/17/2011   [place]    a. s/p ICD 1997;   b. 06/2007 ICD upgrade to MDT Concerto Bi-V though LV lead unable to be placed.   Myasthenia gravis    [place]oma [place] 04/22/2013   MYASTHENIA GRAVIS WITHOUT EXACERBATION 12/05/2007   Qualifier: Diagnosis of  By: KCaryl Comes MD, FRemus Blake   Sciatica    VENTRICULAR TACHYCARDIA 12/05/2007   Qualifier: Diagnosis of  By: KCaryl Comes MD, FRemus Blake   Past Surgical History:  Procedure Laterality Date   anterior lumbar interbody fusion  04/23/2007   L4-L5    BI-VENTRICULAR IMPLANTABLE CARDIOVERTER DEFIBRILLATOR UPGRADE N/A 04/10/2013   failed CRTD upgrade   BI-VENTRICULAR PACEMAKER UPGRADE N/A 10/14/2013   failed CRTD upgrade   CARDIAC DEFIBRILLATOR PLACEMENT  2001; 2008   single chamber MDT ICD implanted for secondary prevention; gen change 2008; explantation 2009   CORONARY ANGIOPLASTY WITH STENT PLACEMENT     bare metal stenting of the LAD in [place](ICD) GENERATOR CHANGE N/A 04/09/2020   Procedure: MEDTRONIC BIV;ONE GEN CHANGE; EXTRACT RIGHT ATRIAL LEAD; REIMPLANT RIGHT ATRIAL LEAD;ADD LEFT BUNDLE PACING LEAD;  Surgeon:  TEvans Lance MD;  [place]  Service: Cardiovascular;  Laterality: N/A;   IMPLANTABLE CARDIOVERTER DEFIBRILLATOR IMPLANT  2009; 2015   MDT dual chamber right sided device implanted 2009 with gen change 2015; unable to place LV lead   Family History  Problem Relation Age of Onset   Heart disease Mother    Heart disease Father    Coronary artery disease Brother    Hypertension Brother    Diabetes Neg Hx    Stroke Neg Hx    Social History   Socioeconomic History   Marital status: Single    Spouse name: Not on file   Number of children: Not on file   Years of education: Not on file   Highest education level: Not on file  Occupational History   Not on  file  Tobacco Use   Smoking status: Former    Packs/day: 0.30    Years: 6.00    Total pack years: 1.80    Types: Cigarettes    Quit date: 01/03/1982    Years since quitting: 40.1    Passive exposure: Never   Smokeless tobacco: Never  Vaping Use   Vaping Use: Never used  Substance and Sexual Activity   Alcohol use: Yes    Comment: occasional   Drug use: No   Sexual activity: Not on file  Other Topics Concern   Not on file  Social History Narrative   Not on file   Social Determinants of Health   Financial Resource Strain: Low Risk  (03/04/2022)   Overall Financial Resource Strain (CARDIA)    Difficulty of Paying Living Expenses: Not hard at all  Food Insecurity: No Food Insecurity (03/04/2022)   Hunger Vital Sign    Worried About Running Out of Food in the Last Year: Never true    Ran Out of Food in the Last Year: Never true  Transportation Needs: No Transportation Needs (03/04/2022)   PRAPARE - Hydrologist (Medical): No    Lack of Transportation (Non-Medical): No  Physical Activity: Insufficiently Active (03/04/2022)   Exercise Vital Sign    Days of Exercise per Week: 4 days    Minutes of Exercise per Session: 30 min  Stress: No Stress Concern Present (03/04/2022)   [place]    Feeling of Stress : Not at all  Social Connections: Moderately Integrated (03/04/2022)   Social Connection and Isolation Panel [NHANES]    Frequency of Communication with Friends and Family: Three times a week    Frequency of Social Gatherings with Friends and Family: Twice a week    Attends Religious Services: 1 to 4 times per year    Active Member of [employer] or Organizations: Yes    Attends Archivist Meetings: Never    Marital Status: Divorced    Tobacco Counseling Counseling given: Not Answered   Clinical Intake:  Pre-visit preparation completed: No  Pain : 0-10 Pain Score: 6  Pain Location: Back Pain Orientation: Lower Pain Descriptors / Indicators: Aching Pain Onset: More than a month ago Pain Frequency: Occasional     Nutritional Risks: None Diabetes: No  How often do you need to have someone help you when you read instructions, pamphlets, or other written materials from your doctor or pharmacy?: 1 - Never  Interpreter Needed?: No   Activities of Daily Living    03/04/2022   10:15 AM  In your present state of health, do you have any difficulty performing the following activities:  Hearing? 0  Vision? 0  Difficulty concentrating or making decisions? 0  Walking or climbing stairs? 0  Dressing or bathing? 0  Doing errands, shopping? 0  Preparing Food and eating ? N  Using the Toilet? N  In the past six months, have you accidently leaked urine? N  Do you have problems with loss of bowel control? N  Managing your Medications? N  Managing your Finances? N  Housekeeping or managing your Housekeeping? N    Patient Care Team: Ladell Pier, MD as PCP - General (Internal Medicine) Deboraha Sprang, MD as PCP - Electrophysiology (Cardiology)  Indicate any recent Medical Services you may have received from other than [facility] providers in the past year (date may be approximate).  Assessment:   This is a routine wellness examination for [place].  Hearing/Vision screen No results found.  Dietary issues and exercise activities discussed: Current Exercise Habits: The patient does not participate in regular exercise at present, Exercise limited by: None identified   Goals Addressed   None    Depression Screen    03/04/2022   10:15 AM 02/18/2022   10:29 AM 10/18/2021    9:33 AM 10/18/2021    9:27 AM  PHQ 2/9 Scores  PHQ - 2 Score 0 0 0 0  PHQ- 9 Score 0 0 1     Fall Risk    03/04/2022   10:14 AM 10/18/2021    9:27 AM 10/14/2021    1:22 PM  [place] in the past year? 0 0 0  Number falls in past yr: 0 0 0  Injury with Fall? 0 0   Risk for fall due to : No Fall Risks No Fall Risks Orthopedic patient;Other (Comment)  Risk for fall due to: Comment   MG  Follow up Falls prevention discussed  Falls prevention discussed    FALL RISK PREVENTION PERTAINING TO THE HOME:  Any stairs in or around the home? Yes  If so, are there any without handrails? No  Home free of loose throw rugs in walkways, pet beds, electrical cords, etc? Yes  Adequate lighting in your home to reduce risk of falls? Yes   ASSISTIVE DEVICES UTILIZED TO PREVENT FALLS:  Life alert? No  Use of a cane, walker or w/c? No  Grab bars in the bathroom? No  Shower chair or bench in shower? No  Elevated toilet seat or a handicapped toilet? No   TIMED UP AND GO:  Was the test performed? No .  Length of time to ambulate 10 feet: N/A sec.   Gait steady and fast without use of assistive device  Cognitive Function:    03/04/2022   10:16 AM  MMSE - Mini Mental State Exam  Orientation to time 5  Orientation to Place 5  Registration 3  Attention/ Calculation 5  Recall 3  Language- name 2 objects 2  Language- repeat 1  Language- follow 3 step command 3  Language- read & follow direction 1  Write a sentence 1  Copy design 1  Total score 30        03/04/2022   10:17 AM  6CIT  Screen  What Year? 0 points  What month? 0 points  What time? 0 points  Count back from 20 0 points  Months in reverse 0 points  Repeat phrase 0 points  Total Score 0 points    Immunizations Immunization History  Administered Date(s) Administered   Fluad Quad(high Dose 65+) 10/18/2021   Influenza Split 09/26/2011   Influenza Whole 10/26/2010   Influenza-Unspecified 11/18/2015   Pneumococcal Conjugate-13 10/27/2016   Pneumococcal Polysaccharide-23 09/26/2011   Zoster Recombinat (Shingrix) 10/20/2016, 04/18/2017    TDAP status: Due, Education has been provided regarding the importance of this vaccine. Advised may receive this vaccine at local pharmacy or Health Dept. Aware to provide a copy of the vaccination record if obtained from local pharmacy or Health Dept. Verbalized acceptance and understanding.  Flu Vaccine status: Up to date  Pneumococcal vaccine status: Up to date  Covid-19 vaccine status: Declined, Education has been provided regarding the importance of this vaccine but patient still declined. Advised may receive this vaccine at local pharmacy or Health Dept.or vaccine clinic. Aware to provide a copy  of the vaccination record if obtained from local pharmacy or Health Dept. Verbalized acceptance and understanding.  Qualifies for Shingles Vaccine? Yes   Zostavax completedyes  Shingrix Completed?: Yes  Screening Tests Health Maintenance  Topic Date Due   Medicare Annual Wellness (AWV)  Never done   COVID-19 Vaccine (1) Never done   Hepatitis C Screening  Never done   DTaP/Tdap/Td (1 - Tdap) Never done   Pneumonia Vaccine 18+ Years old (3 of 3 - PPSV23 or PCV20) 02/19/2023 (Originally 10/27/2021)   COLONOSCOPY (Pts 45-50yr Insurance coverage will need to be confirmed)  05/10/2022   INFLUENZA VACCINE  Completed   Zoster Vaccines- Shingrix  Completed   HPV VACCINES  Aged Out    Health Maintenance  Health Maintenance Due  Topic Date Due   Medicare Annual  Wellness (AWV)  Never done   COVID-19 Vaccine (1) Never done   Hepatitis C Screening  Never done   DTaP/Tdap/Td (1 - Tdap) Never done    Colorectal cancer screening: Type of screening: Colonoscopy. Completed 05/09/2012. Repeat every 10 years  Lung Cancer Screening: (Low Dose CT Chest recommended if Age 74-80years, 30 pack-year currently smoking OR have quit w/in 15years.) does not qualify.   Lung Cancer Screening Referral: N/A  Additional Screening:  Hepatitis C Screening: does qualify; Completed NO  Vision Screening: Recommended annual ophthalmology exams for early detection of glaucoma and other disorders of the eye. Is the patient up to date with their annual eye exam?  Yes  Who is the provider or what is the name of the office in which the patient attends annual eye exams? N/A  If pt is not established with a provider, would they like to be referred to a provider to establish care? No .   Dental Screening: Recommended annual dental exams for proper oral hygiene  Community Resource Referral / Chronic Care Management: CRR required this visit?  No   CCM required this visit?  No      Plan:     I have personally reviewed and noted the following in the patient's chart:   Medical and social history Use of alcohol, tobacco or illicit drugs  Current medications and supplements including opioid prescriptions. Patient is not currently taking opioid prescriptions. Functional ability and status Nutritional status Physical activity Advanced directives List of other physicians Hospitalizations, surgeries, and ER visits in previous 12 months Vitals Screenings to include cognitive, depression, and falls Referrals and appointments  In addition, I have reviewed and discussed with patient certain preventive protocols, quality metrics, and best practice recommendations. A written personalized care plan for preventive services as well as general preventive health recommendations were  provided to patient.     AGomez Cleverly CNorth [place]  03/04/2022   Nurse Notes: I spent 30 minutes on this telephone encounter AVS mailed to patinet

## 2022-04-02 IMAGING — DX DG CHEST 2V
2 series · 2 of 2 positions shown · non-contrast
Comparison: July 08, 2014.

CLINICAL DATA: Defibrillator placement.

EXAM:
CHEST - 2 VIEW

[chest lat]
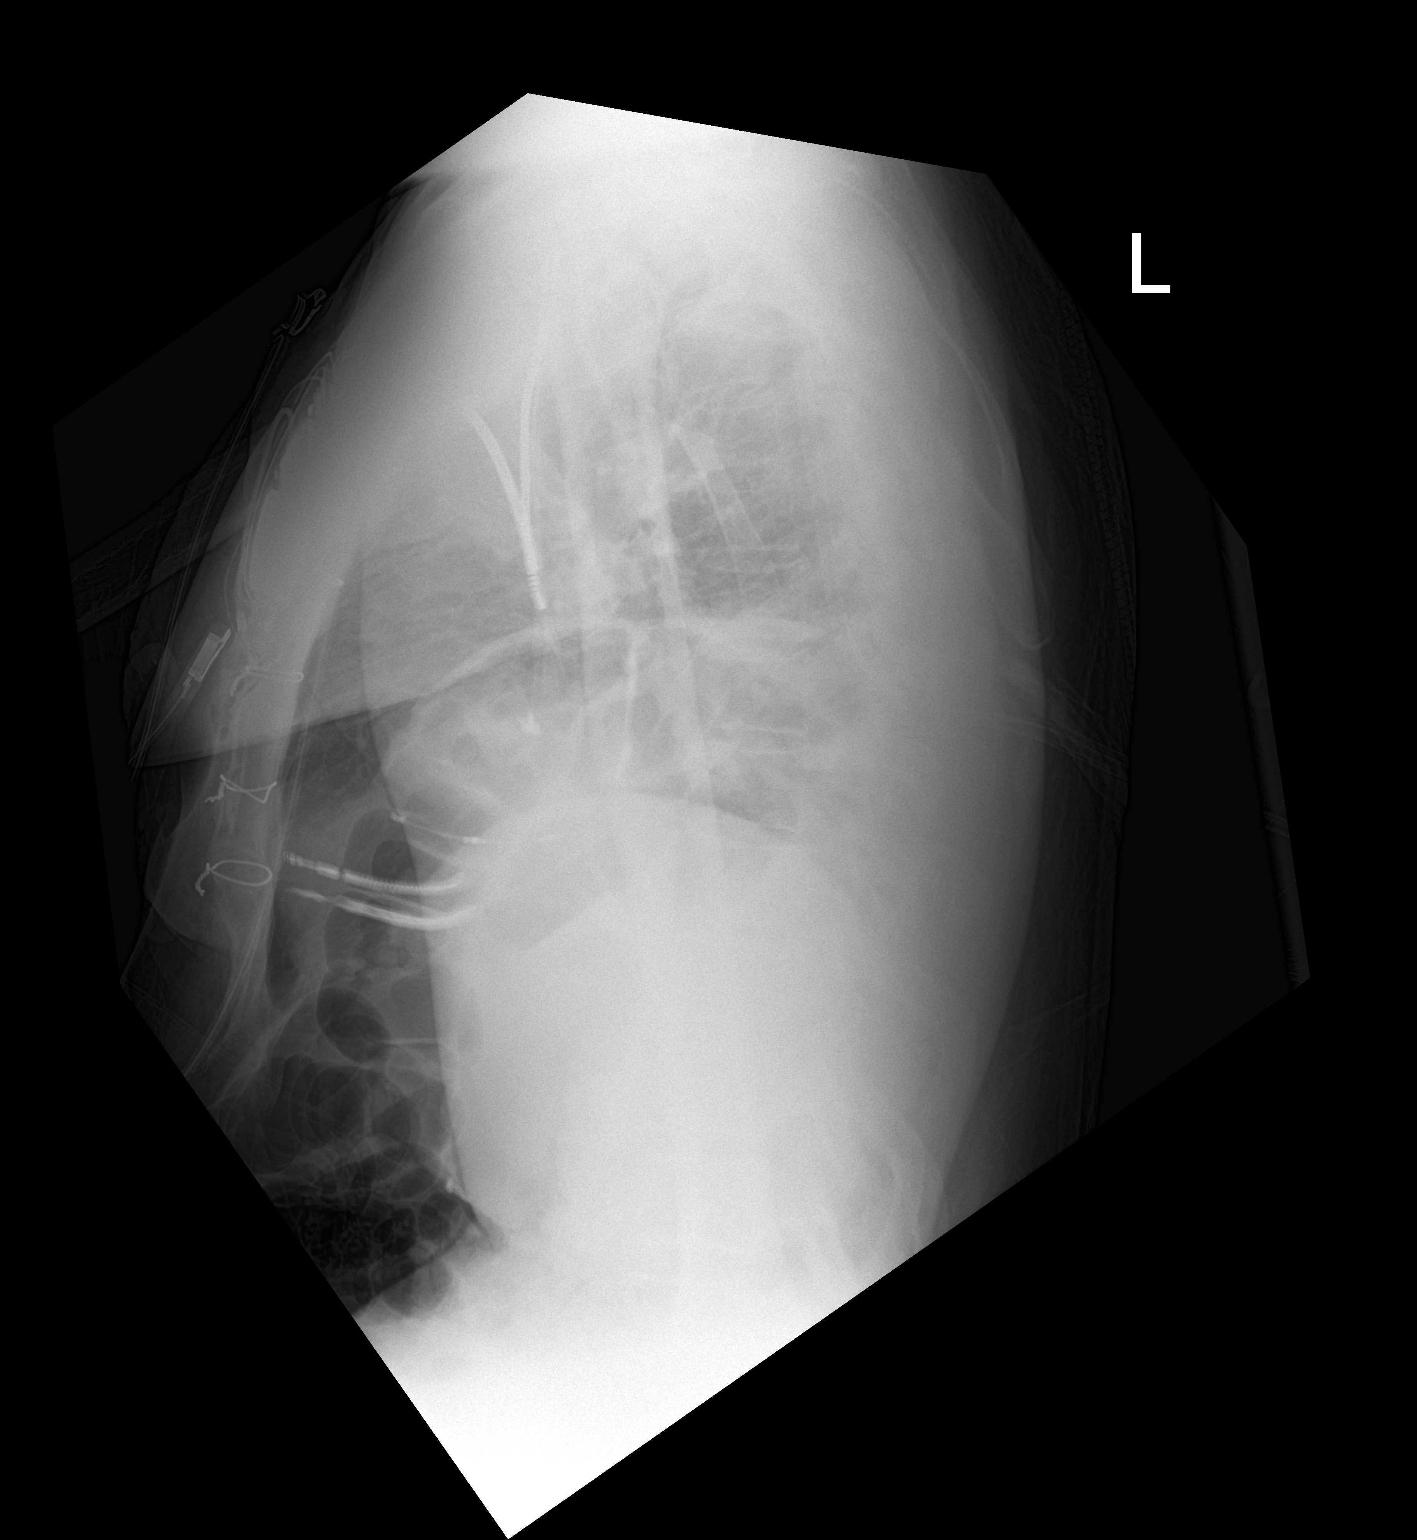

[chest ap]
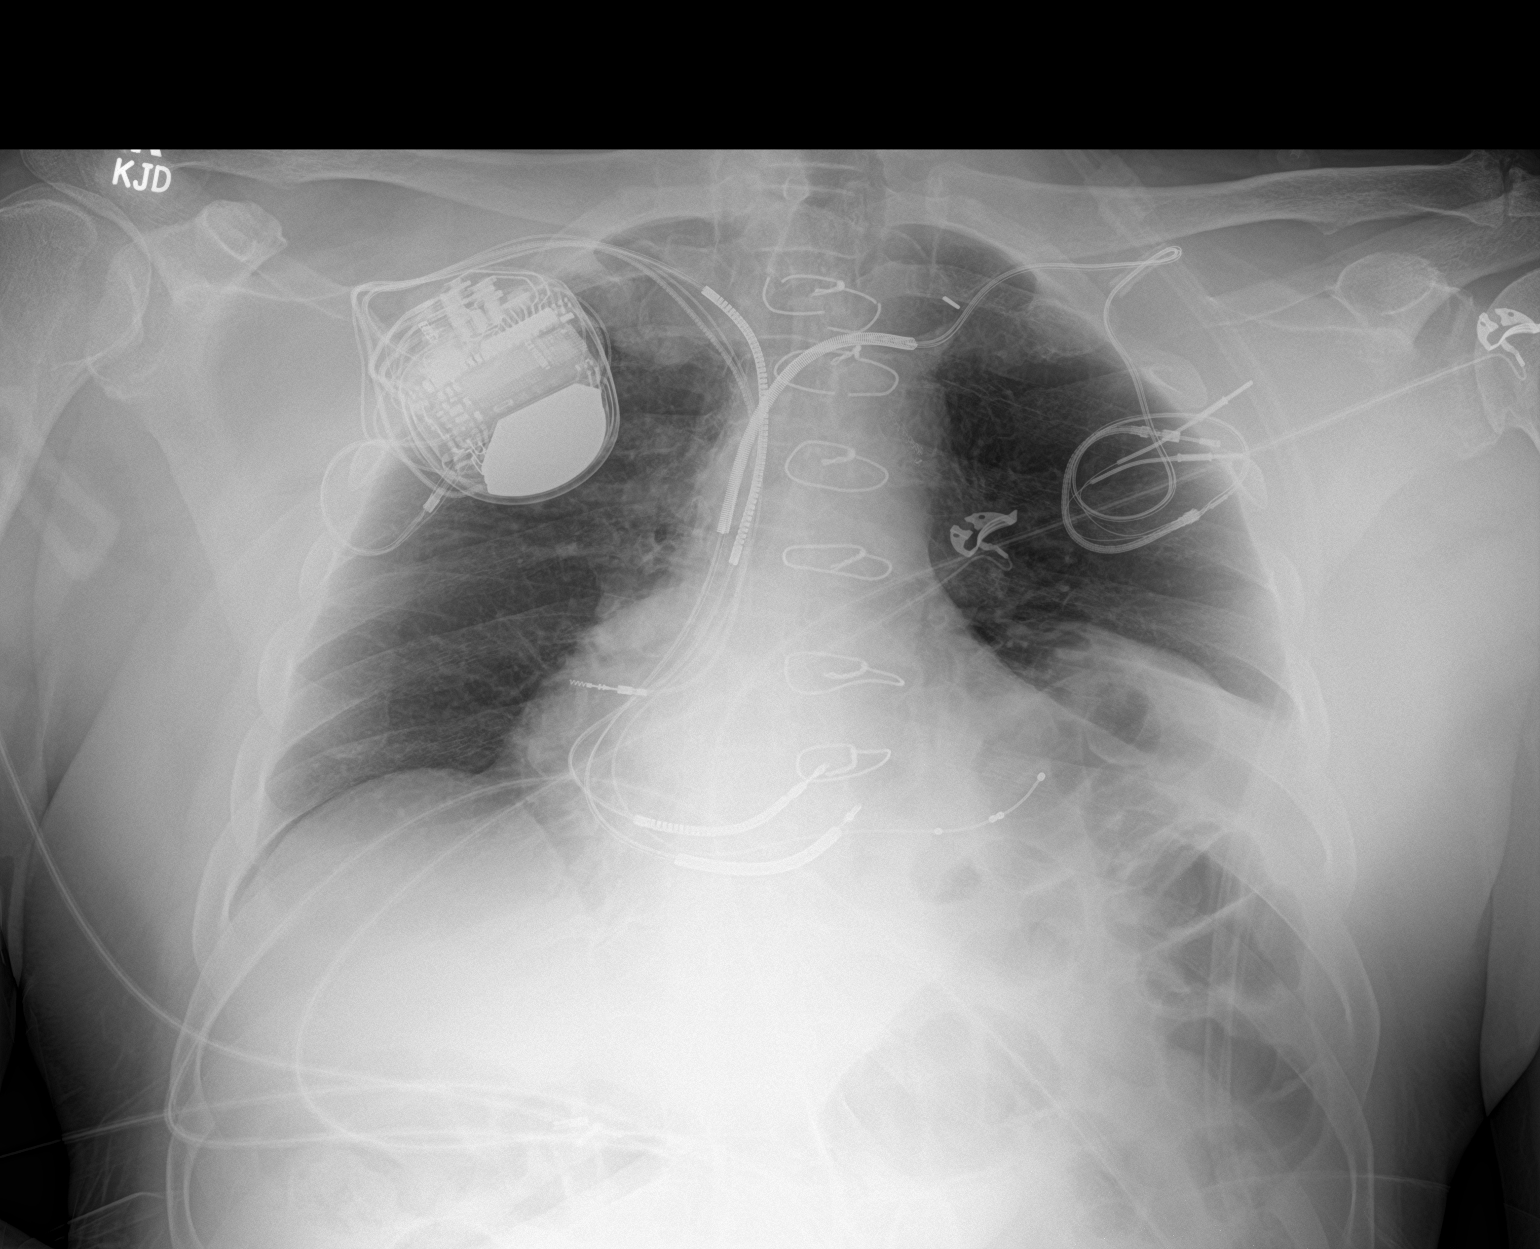

[2 of 2 positions shown; findings below may reference images not displayed]

FINDINGS: Stable cardiomediastinal silhouette. Stable right-sided pacemaker.
No pneumothorax is noted. Stable elevated left hemidiaphragm. No
acute pulmonary disease is noted. Bony thorax is unremarkable.
IMPRESSION: No active cardiopulmonary disease.

## 2022-04-08 ENCOUNTER — Ambulatory Visit (INDEPENDENT_AMBULATORY_CARE_PROVIDER_SITE_OTHER): Payer: 59

## 2022-04-08 DIAGNOSIS — I255 Ischemic cardiomyopathy: Secondary | ICD-10-CM

## 2022-04-11 LAB — CUP PACEART REMOTE DEVICE CHECK
Battery Remaining Longevity: 68 mo
Battery Voltage: 2.98 V
Brady Statistic AP VP Percent: 1.64 %
Brady Statistic AP VS Percent: 0.01 %
Brady Statistic AS VP Percent: 98.35 %
Brady Statistic AS VS Percent: 0 %
Brady Statistic RA Percent Paced: 1.64 %
Brady Statistic RV Percent Paced: 99.98 %
Date Time Interrogation Session: 20240406043827
HighPow Impedance: 40 Ohm
HighPow Impedance: 45 Ohm
Implantable Lead Connection Status: 753985
Implantable Lead Connection Status: 753985
Implantable Lead Connection Status: 753985
Implantable Lead Implant Date: 20090619
Implantable Lead Implant Date: 20220407
Implantable Lead Implant Date: 20220407
Implantable Lead Location: 753858
Implantable Lead Location: 753859
Implantable Lead Location: 753860
Implantable Lead Model: 4398
Implantable Lead Model: 5076
Implantable Lead Model: 7120
Implantable Pulse Generator Implant Date: 20220407
Lead Channel Impedance Value: 136.276
Lead Channel Impedance Value: 139.967
Lead Channel Impedance Value: 143.419
Lead Channel Impedance Value: 160.941
Lead Channel Impedance Value: 166.114
Lead Channel Impedance Value: 247 Ohm
Lead Channel Impedance Value: 247 Ohm
Lead Channel Impedance Value: 304 Ohm
Lead Channel Impedance Value: 323 Ohm
Lead Channel Impedance Value: 342 Ohm
Lead Channel Impedance Value: 342 Ohm
Lead Channel Impedance Value: 380 Ohm
Lead Channel Impedance Value: 399 Ohm
Lead Channel Impedance Value: 456 Ohm
Lead Channel Impedance Value: 494 Ohm
Lead Channel Impedance Value: 532 Ohm
Lead Channel Impedance Value: 570 Ohm
Lead Channel Impedance Value: 589 Ohm
Lead Channel Pacing Threshold Amplitude: 0.875 V
Lead Channel Pacing Threshold Amplitude: 0.875 V
Lead Channel Pacing Threshold Amplitude: 1 V
Lead Channel Pacing Threshold Pulse Width: 0.4 ms
Lead Channel Pacing Threshold Pulse Width: 0.4 ms
Lead Channel Pacing Threshold Pulse Width: 0.4 ms
Lead Channel Sensing Intrinsic Amplitude: 3.625 mV
Lead Channel Sensing Intrinsic Amplitude: 3.625 mV
Lead Channel Sensing Intrinsic Amplitude: 7.125 mV
Lead Channel Sensing Intrinsic Amplitude: 7.125 mV
Lead Channel Setting Pacing Amplitude: 1.5 V
Lead Channel Setting Pacing Amplitude: 1.75 V
Lead Channel Setting Pacing Amplitude: 2 V
Lead Channel Setting Pacing Pulse Width: 0.4 ms
Lead Channel Setting Pacing Pulse Width: 0.4 ms
Lead Channel Setting Sensing Sensitivity: 0.3 mV
Zone Setting Status: 755011
Zone Setting Status: 755011

## 2022-05-10 NOTE — Progress Notes (Signed)
Remote ICD transmission.   

## 2022-05-18 ENCOUNTER — Ambulatory Visit: Payer: 59 | Attending: Internal Medicine | Admitting: Pharmacist

## 2022-05-18 DIAGNOSIS — Z79899 Other long term (current) drug therapy: Secondary | ICD-10-CM

## 2022-05-18 MED ORDER — ATORVASTATIN CALCIUM 40 MG PO TABS: 40.0000 mg | ORAL_TABLET | Freq: Every day | ORAL | 2 refills | Status: DC

## 2022-05-18 NOTE — Progress Notes (Signed)
05/18/2022 Name: [name] MRN: [id number] DOB: 04/15/1950  No chief complaint on file.  [name] is a 72 y.o. year old male who presented for a telephone visit.   They were referred to the pharmacist by a quality report for assistance in managing medication access. No issues, reports medication adherence. He has an upcoming appt with Dr. Laural Benes.  Patient is participating in a Managed Medicaid Plan: No  Subjective:  Care Team: Primary Care Provider: Marcine Matar, MD ; Next Scheduled Visit: 06/28/2022  Medication Access/Adherence  Current Pharmacy:  [id number] - [place], [place] - [address] AT [facility] [address] [place] [address] Phone: [phone] Fax: [phone]   Patient reports affordability concerns with their medications: No  Patient reports access/transportation concerns to their pharmacy: No  Patient reports adherence concerns with their medications:  No     Medication Management:  Current adherence strategy: appropriate  Patient reports Good adherence to medications. Heart medications are refilled by his Cardiologist.   Patient reports the following barriers to adherence: none reported  Objective:  Lab Results  Component Value Date   HGBA1C 6.3 (H) 02/18/2022    Lab Results  Component Value Date   CREATININE 0.95 02/18/2022   BUN 14 02/18/2022   NA 139 02/18/2022   K 4.3 02/18/2022   CL 102 02/18/2022   CO2 24 02/18/2022    Lab Results  Component Value Date   CHOL 174 10/18/2021   HDL 50 10/18/2021   LDLCALC 108 (H) 10/18/2021   TRIG 87 10/18/2021   CHOLHDL 3.5 10/18/2021    Medications Reviewed Today     Reviewed by Ronette Deter, CMA (Certified Medical Assistant) on 03/04/22 at 1010  Med List Status: <None>   Medication Order Taking? Sig Documenting Provider Last Dose Status Informant  acetaminophen (TYLENOL) 325 MG tablet [id number]  Take  1-2 tablets (325-650 mg total) by mouth every 4 (four) hours as needed for mild pain. Graciella Freer, PA-C  Active   aspirin EC 81 MG tablet [id number]  Take 1 tablet (81 mg total) by mouth daily. Please don't restart until after your Wound check on 4/22. Graciella Freer, PA-C  Active   atorvastatin (LIPITOR) 40 MG tablet [id number]  Take 40 mg by mouth at bedtime. [provider]  Active Self  azaTHIOprine (IMURAN) 50 MG tablet [id number]  Take 150 mg by mouth daily. [provider]  Active Self  carvedilol (COREG) 3.125 MG tablet [id number]  Take 1 tablet (3.125 mg total) by mouth 2 (two) times daily with a meal. Please make overdue appt with Dr. Graciela Husbands. 2nd attempt Marily Lente, NP  Active Self  furosemide (LASIX) 40 MG tablet [id number]  Take 1.5 tablets (60 mg total) by mouth daily. Duke Salvia, MD  Active Self  Multiple Vitamin (MULTIVITAMIN) capsule [id number]  Take 1 capsule by mouth daily. [provider]  Active Self  Nutritional Supplements (GRAPESEED EXTRACT PO) [id number]  Take 2 capsules by mouth daily. [provider]  Active Self  omeprazole (PRILOSEC) 40 MG capsule [id number]  Take 40 mg by mouth daily. [provider]  Active Self  OVER THE COUNTER MEDICATION [id number]  Take 1 tablet by mouth daily. Blue green algae [provider]  Active Self  OVER THE COUNTER MEDICATION [id number]  Take 2 tablets by mouth daily. Digestive enzymes [provider]  Active Self  Polyvinyl Alcohol-Povidone (REFRESH  OP) [id number]  Place 1 drop into both eyes daily as needed (red eyes). [provider]  Active Self  potassium chloride SA (K-DUR,KLOR-CON) 20 MEQ tablet [id number]  TAKE 1 TABLET BY MOUTH  DAILY ( MUST KEEP UPCOMING APPOINTMENT FOR FURTHER REFILLS) Duke Salvia, MD  Active   predniSONE (DELTASONE) 5 MG tablet [id number]  Take 5 mg by mouth every other day. [provider]  Active Self   sacubitril-valsartan (ENTRESTO) 49-51 MG [id number]  Take 1 tablet by mouth 2 (two) times daily. [provider]  Active Self  sotalol (BETAPACE) 120 MG tablet [id number]  Take 1 tablet by mouth two times daily. Patient is overdue for an appointment and needs to call and schedule for further refills 2nd attempt Duke Salvia, MD  Active             Assessment/Plan:   Medication Management: - Currently strategy sufficient to maintain appropriate adherence to prescribed medication regimen - Suggested use of weekly pill box to organize medications - Created list of medication, indication, and administration time. Provided to patient  Follow Up Plan: 06/28/2022 with PCP.   Butch Penny, PharmD, Patsy [place], CPP Clinical Pharmacist [facility] [phone]

## 2022-06-03 DIAGNOSIS — I25118 Atherosclerotic heart disease of native coronary artery with other forms of angina pectoris: Secondary | ICD-10-CM | POA: Diagnosis not present

## 2022-06-03 DIAGNOSIS — I38 Endocarditis, valve unspecified: Secondary | ICD-10-CM | POA: Diagnosis not present

## 2022-06-03 DIAGNOSIS — I5022 Chronic systolic (congestive) heart failure: Secondary | ICD-10-CM | POA: Diagnosis not present

## 2022-06-03 DIAGNOSIS — I255 Ischemic cardiomyopathy: Secondary | ICD-10-CM | POA: Diagnosis not present

## 2022-06-28 ENCOUNTER — Ambulatory Visit: Payer: 59 | Attending: Internal Medicine | Admitting: Internal Medicine

## 2022-06-28 ENCOUNTER — Encounter: Payer: Self-pay | Admitting: Internal Medicine

## 2022-06-28 VITALS — BP 97/61 | HR 71 | Temp 97.9°F | Ht 68.0 in | Wt 173.0 lb

## 2022-06-28 DIAGNOSIS — D4989 Neoplasm of unspecified behavior of other specified sites: Secondary | ICD-10-CM

## 2022-06-28 DIAGNOSIS — G7000 Myasthenia gravis without (acute) exacerbation: Secondary | ICD-10-CM

## 2022-06-28 DIAGNOSIS — R0981 Nasal congestion: Secondary | ICD-10-CM

## 2022-06-28 DIAGNOSIS — Z1211 Encounter for screening for malignant neoplasm of colon: Secondary | ICD-10-CM

## 2022-06-28 DIAGNOSIS — R7303 Prediabetes: Secondary | ICD-10-CM | POA: Diagnosis not present

## 2022-06-28 DIAGNOSIS — I5022 Chronic systolic (congestive) heart failure: Secondary | ICD-10-CM

## 2022-06-28 MED ORDER — FUROSEMIDE 40 MG PO TABS: 60.0000 mg | ORAL_TABLET | Freq: Every day | ORAL | 3 refills | Status: AC

## 2022-06-28 NOTE — Progress Notes (Signed)
Patient ID: [name], male    DOB: [birth year]  MRN: [id number]  CC: Follow-up (Follow up. Leeanne Rio x2 days/Yes to colonoscopy referral. )   Subjective: [name] is a 72 y.o. male who presents for chronic ds management His concerns today include:  Patient with history of ICM (EF 40% 07/2019) with ICD placement, HFrEF, Hx AV block complete, V. tach, myasthenia   Systolic CHF/ICM/ICD in placed: sees Dr. Sharyn Lull; last seen 2 wks ago.  No med changes no CP/SOB/PND/LE edema/palpitations.  History of AV block.  Reports no syncopal episodes.  He has an ICD No recent ICD shocks.  Compliant with atorvastatin 40 mg daily carvedilol 3.125 mg twice a day, sotalol 120 mg BID, furosemide 60 mg daily, potassium supplement and Entresto 49/51 1 tablet twice a day.  MG:  Still on Imuran and prednisone 5 mg daily being refilled by his cardiologist Dr. Sharyn Lull.  Has upcoming appt with [facility] in the next 1-2 wks  Diagnosed with prediabetes on last visit.  Referred to nutritionist.  Did not get to see the nutritionist because it was not covered by his insurance.  Down 7 pounds since October of last year. Doing well with eating habits.   Saw ortho for trigger finger LT ring finger.  It helped but did not like the process of the injection. 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    Started having some nasal congestion since yesterday.  Has Flonase which he uses PRN but did not use it yesterday.  No SOB or cough.   Patient Active Problem List   Diagnosis Date Noted   Prediabetes 02/19/2022   Chronic systolic (congestive) heart failure (HCC) 04/09/2020   Hyperlipidemia 04/22/2013   CHF (congestive heart failure) (HCC) 04/22/2013   Cardiac defibrillator in place 04/22/2013   Myasthenia gravis associated with thymoma (HCC) 04/22/2013   Seasonal and perennial allergic rhinitis 09/04/2011   Allergic conjunctivitis 09/04/2011   Midsternal chest pain 08/17/2011   Automatic implantable  cardioverter-defibrillator in situ 01/18/2011   FATIGUE 08/18/2009   Chronic systolic CHF (congestive heart failure) (HCC) 05/08/2008   MYASTHENIA GRAVIS WITHOUT EXACERBATION 12/05/2007   CORONARY ATHEROSCLEROSIS NATIVE CORONARY ARTERY 12/05/2007   Ischemic cardiomyopathy 12/05/2007   AV BLOCK, COMPLETE 12/05/2007   VENTRICULAR TACHYCARDIA 12/05/2007     Current Outpatient Medications on File Prior to Visit  Medication Sig Dispense Refill   acetaminophen (TYLENOL) 325 MG tablet Take 1-2 tablets (325-650 mg total) by mouth every 4 (four) hours as needed for mild pain.     aspirin EC 81 MG tablet Take 1 tablet (81 mg total) by mouth daily. Please don't restart until after your Wound check on 4/22. 30 tablet 11   atorvastatin (LIPITOR) 40 MG tablet Take 1 tablet (40 mg total) by mouth at bedtime. 90 tablet 2   azaTHIOprine (IMURAN) 50 MG tablet Take 150 mg by mouth daily.     carvedilol (COREG) 3.125 MG tablet Take 1 tablet (3.125 mg total) by mouth 2 (two) times daily with a meal. Please make overdue appt with Dr. Graciela Husbands. 2nd attempt 30 tablet 0   furosemide (LASIX) 40 MG tablet Take 1.5 tablets (60 mg total) by mouth daily. 135 tablet 3   Multiple Vitamin (MULTIVITAMIN) capsule Take 1 capsule by mouth daily.     Nutritional Supplements (GRAPESEED EXTRACT PO) Take 2 capsules by mouth daily.     omeprazole (PRILOSEC) 40 MG capsule Take 40 mg by mouth daily.     OVER THE  COUNTER MEDICATION Take 1 tablet by mouth daily. Blue green algae     OVER THE COUNTER MEDICATION Take 2 tablets by mouth daily. Digestive enzymes     Polyvinyl Alcohol-Povidone (REFRESH OP) Place 1 drop into both eyes daily as needed (red eyes).     potassium chloride SA (K-DUR,KLOR-CON) 20 MEQ tablet TAKE 1 TABLET BY MOUTH  DAILY ( MUST KEEP UPCOMING APPOINTMENT FOR FURTHER REFILLS) 90 tablet 3   predniSONE (DELTASONE) 5 MG tablet Take 5 mg by mouth every other day.     sacubitril-valsartan (ENTRESTO) 49-51 MG Take 1 tablet by  mouth 2 (two) times daily.     sotalol (BETAPACE) 120 MG tablet Take 1 tablet by mouth two times daily. Patient is overdue for an appointment and needs to call and schedule for further refills 2nd attempt 30 tablet 0   No current facility-administered medications on file prior to visit.    Allergies  Allergen Reactions   Procainamide Other (See Comments)    Allergy is from [facility] - pt is not aware of any reaction to this medicine   Quinidine Other (See Comments)    Allergy is from [facility] records - pt is not aware of any reaction to this medicine    Social History   Socioeconomic History   Marital status: Single    Spouse name: Not on file   Number of children: Not on file   Years of education: Not on file   Highest education level: Not on file  Occupational History   Not on file  Tobacco Use   Smoking status: Former    Packs/day: 0.30    Years: 6.00    Additional pack years: 0.00    Total pack years: 1.80    Types: Cigarettes    Quit date: 01/03/1982    Years since quitting: 40.5    Passive exposure: Never   Smokeless tobacco: Never  Vaping Use   Vaping Use: Never used  Substance and Sexual Activity   Alcohol use: Yes    Comment: occasional   Drug use: No   Sexual activity: Not on file  Other Topics Concern   Not on file  Social History Narrative   Not on file   Social Determinants of Health   Financial Resource Strain: Low Risk  (03/04/2022)   Overall Financial Resource Strain (CARDIA)    Difficulty of Paying Living Expenses: Not hard at all  Food Insecurity: No Food Insecurity (03/04/2022)   Hunger Vital Sign    Worried About Running Out of Food in the Last Year: Never true    Ran Out of Food in the Last Year: Never true  Transportation Needs: No Transportation Needs (03/04/2022)   PRAPARE - Administrator, Civil Service (Medical): No    Lack of Transportation (Non-Medical): No  Physical Activity: Insufficiently Active (03/04/2022)    Exercise Vital Sign    Days of Exercise per Week: 4 days    Minutes of Exercise per Session: 30 min  Stress: No Stress Concern Present (03/04/2022)   [employer] of Occupational Health - Occupational Stress Questionnaire    Feeling of Stress : Not at all  Social Connections: Moderately Integrated (03/04/2022)   Social Connection and Isolation Panel [NHANES]    Frequency of Communication with Friends and Family: Three times a week    Frequency of Social Gatherings with Friends and Family: Twice a week    Attends Religious Services: 1 to 4 times per year  Active Member of Clubs or Organizations: Yes    Attends Banker Meetings: Never    Marital Status: Divorced  Catering manager Violence: Not At Risk (03/04/2022)   Humiliation, Afraid, Rape, and Kick questionnaire    Fear of Current or Ex-Partner: No    Emotionally Abused: No    Physically Abused: No    Sexually Abused: No    Family History  Problem Relation Age of Onset   Heart disease Mother    Heart disease Father    Coronary artery disease Brother    Hypertension Brother    Diabetes Neg Hx    Stroke Neg Hx     Past Surgical History:  Procedure Laterality Date   anterior lumbar interbody fusion  04/23/2007   L4-L5    BI-VENTRICULAR IMPLANTABLE CARDIOVERTER DEFIBRILLATOR UPGRADE N/A 04/10/2013   failed CRTD upgrade   BI-VENTRICULAR PACEMAKER UPGRADE N/A 10/14/2013   failed CRTD upgrade   CARDIAC DEFIBRILLATOR PLACEMENT  2001; 2008   single chamber MDT ICD implanted for secondary prevention; gen change 2008; explantation 2009   CORONARY ANGIOPLASTY WITH STENT PLACEMENT     bare metal stenting of the LAD in 1997   GALLBLADDER SURGERY     IMPLANTABLE CARDIOVERTER DEFIBRILLATOR (ICD) GENERATOR CHANGE N/A 04/09/2020   Procedure: MEDTRONIC BIV;ONE GEN CHANGE; EXTRACT RIGHT ATRIAL LEAD; REIMPLANT RIGHT ATRIAL LEAD;ADD LEFT BUNDLE PACING LEAD;  Surgeon: Marinus Maw, MD;  Location: [facility] OR;  Service:  Cardiovascular;  Laterality: N/A;   IMPLANTABLE CARDIOVERTER DEFIBRILLATOR IMPLANT  2009; 2015   MDT dual chamber right sided device implanted 2009 with gen change 2015; unable to place LV lead    ROS: Review of Systems Negative except as stated above  PHYSICAL EXAM: BP 97/61 (BP Location: Left Arm, Patient Position: Sitting, Cuff Size: Normal)   Pulse 71   Temp 97.9 F (36.6 C) (Oral)   Ht 5\' 8"  (1.727 m)   Wt 173 lb (78.5 kg)   SpO2 98%   BMI 26.30 kg/m   Wt Readings from Last 3 Encounters:  06/28/22 173 lb (78.5 kg)  02/18/22 176 lb (79.8 kg)  10/18/21 180 lb (81.6 kg)    Physical Exam   General appearance - alert, well appearing, elderly AAM and in no distress Mental status - normal mood, behavior, speech, dress, motor activity, and thought processes Nose - normal and patent, no erythema, discharge or polyps Mouth - mucous membranes moist, pharynx normal without lesions Neck - supple, no significant adenopathy Chest - clear to auscultation, no wheezes, rales or rhonchi, symmetric air entry Heart - normal rate, regular rhythm, normal S1, S2, no murmurs, rubs, clicks or gallops Extremities - peripheral pulses normal, no pedal edema, no clubbing or cyanosis     Latest Ref Rng & Units 02/18/2022   11:41 AM 10/18/2021   10:30 AM 04/03/2020    8:53 AM  CMP  Glucose 70 - 99 mg/dL 829  562  130   BUN 8 - 27 mg/dL 14  16  10    Creatinine 0.76 - 1.27 mg/dL 8.65  7.84  6.96   Sodium 134 - 144 mmol/L 139  140  139   Potassium 3.5 - 5.2 mmol/L 4.3  4.6  4.3   Chloride 96 - 106 mmol/L 102  101  100   CO2 20 - 29 mmol/L 24  25  25    Calcium 8.6 - 10.2 mg/dL 8.5  9.2  9.3   Total Protein 6.0 - 8.5 g/dL 7.2  7.6  Total Bilirubin 0.0 - 1.2 mg/dL 0.4  0.3    Alkaline Phos 44 - 121 IU/L 88  103    AST 0 - 40 IU/L 23  21    ALT 0 - 44 IU/L 16  18     Lipid Panel     Component Value Date/Time   CHOL 174 10/18/2021 1030   TRIG 87 10/18/2021 1030   HDL 50 10/18/2021 1030    CHOLHDL 3.5 10/18/2021 1030   CHOLHDL 2.6 08/14/2015 0836   VLDL 18 08/14/2015 0836   LDLCALC 108 (H) 10/18/2021 1030    CBC    Component Value Date/Time   WBC 5.6 02/18/2022 1141   WBC 5.9 07/08/2014 1330   RBC 4.23 02/18/2022 1141   RBC 4.19 (L) 07/08/2014 1330   HGB 13.2 02/18/2022 1141   HCT 39.2 02/18/2022 1141   PLT 209 02/18/2022 1141   MCV 93 02/18/2022 1141   MCH 31.2 02/18/2022 1141   MCH 31.3 07/08/2014 1330   MCHC 33.7 02/18/2022 1141   MCHC 32.8 07/08/2014 1330   RDW 12.3 02/18/2022 1141   LYMPHSABS 1.7 04/03/2020 0853   MONOABS 0.4 02/17/2014 1240   EOSABS 0.2 04/03/2020 0853   BASOSABS 0.1 04/03/2020 0853    ASSESSMENT AND PLAN:  1. Chronic systolic heart failure (HCC) Compensated and stable Followed by cardiology.  Continue meds listed above - furosemide (LASIX) 40 MG tablet; Take 1.5 tablets (60 mg total) by mouth daily.  Dispense: 135 tablet; Refill: 3  2. Myasthenia gravis associated with thymoma (HCC) Stable.  On Imuran and prednisone prescribed by his cardiologist.  Has upcoming appointment in the next 1 to 2 weeks with [facility] neurology.  Encouraged him to keep the appointment.  3. Prediabetes Commended him on weight loss.  Encouraged him to continue healthy eating habits and regular exercise.  4. Screening for colon cancer - Ambulatory referral to Gastroenterology  5. Sinus congestion Continue to use the Flonase as needed.    Patient was given the opportunity to ask questions.  Patient verbalized understanding of the plan and was able to repeat key elements of the plan.   This documentation was completed using Paediatric nurse.  Any transcriptional errors are unintentional.  No orders of the defined types were placed in this encounter.    Requested Prescriptions    No prescriptions requested or ordered in this encounter    No follow-ups on file.  Jonah Blue, MD, FACP

## 2022-07-08 ENCOUNTER — Ambulatory Visit (INDEPENDENT_AMBULATORY_CARE_PROVIDER_SITE_OTHER): Payer: 59

## 2022-07-08 DIAGNOSIS — I255 Ischemic cardiomyopathy: Secondary | ICD-10-CM

## 2022-07-08 LAB — CUP PACEART REMOTE DEVICE CHECK
Battery Remaining Longevity: 67 mo
Battery Voltage: 2.99 V
Brady Statistic AP VP Percent: 4.55 %
Brady Statistic AP VS Percent: 0.01 %
Brady Statistic AS VP Percent: 95.43 %
Brady Statistic AS VS Percent: 0 %
Brady Statistic RA Percent Paced: 4.48 %
Brady Statistic RV Percent Paced: 99.98 %
Date Time Interrogation Session: 20240705043724
HighPow Impedance: 40 Ohm
HighPow Impedance: 46 Ohm
Implantable Lead Connection Status: 753985
Implantable Lead Connection Status: 753985
Implantable Lead Connection Status: 753985
Implantable Lead Implant Date: 20090619
Implantable Lead Implant Date: 20220407
Implantable Lead Implant Date: 20220407
Implantable Lead Location: 753858
Implantable Lead Location: 753859
Implantable Lead Location: 753860
Implantable Lead Model: 4398
Implantable Lead Model: 5076
Implantable Lead Model: 7120
Implantable Pulse Generator Implant Date: 20220407
Lead Channel Impedance Value: 136.276
Lead Channel Impedance Value: 139.967
Lead Channel Impedance Value: 149.697
Lead Channel Impedance Value: 168.889
Lead Channel Impedance Value: 174.595
Lead Channel Impedance Value: 247 Ohm
Lead Channel Impedance Value: 247 Ohm
Lead Channel Impedance Value: 304 Ohm
Lead Channel Impedance Value: 323 Ohm
Lead Channel Impedance Value: 342 Ohm
Lead Channel Impedance Value: 342 Ohm
Lead Channel Impedance Value: 380 Ohm
Lead Channel Impedance Value: 380 Ohm
Lead Channel Impedance Value: 456 Ohm
Lead Channel Impedance Value: 513 Ohm
Lead Channel Impedance Value: 532 Ohm
Lead Channel Impedance Value: 532 Ohm
Lead Channel Impedance Value: 589 Ohm
Lead Channel Pacing Threshold Amplitude: 0.75 V
Lead Channel Pacing Threshold Amplitude: 0.75 V
Lead Channel Pacing Threshold Amplitude: 1 V
Lead Channel Pacing Threshold Pulse Width: 0.4 ms
Lead Channel Pacing Threshold Pulse Width: 0.4 ms
Lead Channel Pacing Threshold Pulse Width: 0.4 ms
Lead Channel Sensing Intrinsic Amplitude: 3.375 mV
Lead Channel Sensing Intrinsic Amplitude: 3.375 mV
Lead Channel Sensing Intrinsic Amplitude: 7.125 mV
Lead Channel Sensing Intrinsic Amplitude: 7.125 mV
Lead Channel Setting Pacing Amplitude: 1.25 V
Lead Channel Setting Pacing Amplitude: 1.5 V
Lead Channel Setting Pacing Amplitude: 2 V
Lead Channel Setting Pacing Pulse Width: 0.4 ms
Lead Channel Setting Pacing Pulse Width: 0.4 ms
Lead Channel Setting Sensing Sensitivity: 0.3 mV
Zone Setting Status: 755011
Zone Setting Status: 755011

## 2022-07-26 NOTE — Progress Notes (Signed)
Remote ICD transmission.   

## 2022-08-02 DIAGNOSIS — I428 Other cardiomyopathies: Secondary | ICD-10-CM | POA: Insufficient documentation

## 2022-08-05 ENCOUNTER — Ambulatory Visit: Payer: 59 | Admitting: Internal Medicine

## 2022-08-05 ENCOUNTER — Encounter: Payer: Self-pay | Admitting: Internal Medicine

## 2022-08-05 VITALS — BP 100/62 | HR 80 | Ht 68.0 in | Wt 169.0 lb

## 2022-08-05 DIAGNOSIS — I4729 Other ventricular tachycardia: Secondary | ICD-10-CM

## 2022-08-05 DIAGNOSIS — I5022 Chronic systolic (congestive) heart failure: Secondary | ICD-10-CM

## 2022-08-05 DIAGNOSIS — I442 Atrioventricular block, complete: Secondary | ICD-10-CM | POA: Diagnosis not present

## 2022-08-05 DIAGNOSIS — Z95810 Presence of automatic (implantable) cardiac defibrillator: Secondary | ICD-10-CM

## 2022-08-05 DIAGNOSIS — I428 Other cardiomyopathies: Secondary | ICD-10-CM

## 2022-08-05 DIAGNOSIS — I255 Ischemic cardiomyopathy: Secondary | ICD-10-CM

## 2022-08-05 NOTE — Progress Notes (Signed)
Patient Care Team: Marcine Matar, MD as PCP - General (Internal Medicine) Duke Salvia, MD as PCP - Electrophysiology (Cardiology)   HPI  [name] is a 72 y.o. male is seen in followup for ventricular tachycardia in the setting of ischemic and nonischemic heart disease. He is status post ICD implantation.   Following multiple failed attempts at left ventricular lead placement, he underwent a complex revision procedure 4/22 with Dr. Leonia Reeves.  He underwent extraction of an old atrial pacing lead with insertion of a new atrial lead, successful deployment of an LV pacing lead with generator replacement.  ECG post implant demonstrated a qR in lead I and an upright QRS in lead V2; V1 was not recorded.  QRSd prior to upgrade was 192, post upgrade was about 140 ms.   The patient denies chest pain, shortness of breath, nocturnal dyspnea, orthopnea or peripheral edema.  There have been no palpitations, lightheadedness or syncope.           DATE TEST EF   9/13 LHC  45% Nonobstructive CAs  9/15 Echo  30-35 %   1/17 Echo  40-45 %   7/21 Echo  40%       Date Cr K Hgb  3/18 1.0 4.0 15   6/21 1.01 4.3   4/22 1.03 4.3 14.1  2/24 0.95 4.3 13.2     MG followed at [facility]--last seen 2019  Review of his operative report from 2015 demonstrated significant successful effort finally to get a lead delivery system into the right atrium from the left subclavian vein. There comes at the right subclavian vein system was occluded. Past Medical History:  Diagnosis Date   Automatic implantable cardioverter-defibrillator in situ 01/18/2011   AV BLOCK, COMPLETE 12/05/2007   Qualifier: Diagnosis of  By: Graciela Husbands, MD, [facility], Ty Hilts    CAD (coronary artery disease)    a. s/p PCI/BMS of prox LAD in 1997;  b. 4/10 LHC: nonobs dzs. (pRCA 50-70%);  c.   Myoview 08/23/11 : Scar with peri-infarct ischemia infecting the entire septum with a corresponding wall motion abnormality, moderate risk scan, EF  42%.   Cardiac defibrillator in place 04/22/2013   CHF (congestive heart failure) (HCC) 04/22/2013   Chronic back pain    a. s/p multiple lumbar surgeries   Chronic systolic CHF (congestive heart failure) (HCC) 05/08/2008   Qualifier: Diagnosis of  By: Graciela Husbands, MD, Susie Cassette    Complete heart block [facility])    a. h/o transient complete heart block in April 2009   CORONARY ATHEROSCLEROSIS NATIVE CORONARY ARTERY 12/05/2007   Qualifier: Diagnosis of  By: Graciela Husbands, MD, Susie Cassette    DJD (degenerative joint disease)    GERD (gastroesophageal reflux disease)    Hyperlipidemia    Hypertension    Ischemic cardiomyopathy    a. 05/2009 Echo: EF 40%, mild to mod glob HK, Gr 2 dd, mild LVH, Triv AI, Mild MR, PASP 37-74mmHg   Midsternal chest pain 08/17/2011   Monomorphic ventricular tachycardia (HCC)    a. s/p ICD 1997;   b. 06/2007 ICD upgrade to MDT Concerto Bi-V though LV lead unable to be placed.   Myasthenia gravis    Myasthenia gravis associated with thymoma (HCC) 04/22/2013   MYASTHENIA GRAVIS WITHOUT EXACERBATION 12/05/2007   Qualifier: Diagnosis of  By: Graciela Husbands, MD, Susie Cassette    Sciatica    VENTRICULAR TACHYCARDIA 12/05/2007   Qualifier: Diagnosis of  By: Graciela Husbands, MD, Susie Cassette  Past Surgical History:  Procedure Laterality Date   anterior lumbar interbody fusion  04/23/2007   L4-L5    BI-VENTRICULAR IMPLANTABLE CARDIOVERTER DEFIBRILLATOR UPGRADE N/A 04/10/2013   failed CRTD upgrade   BI-VENTRICULAR PACEMAKER UPGRADE N/A 10/14/2013   failed CRTD upgrade   CARDIAC DEFIBRILLATOR PLACEMENT  2001; 2008   single chamber MDT ICD implanted for secondary prevention; gen change 2008; explantation 2009   CORONARY ANGIOPLASTY WITH STENT PLACEMENT     bare metal stenting of the LAD in 1997   GALLBLADDER SURGERY     IMPLANTABLE CARDIOVERTER DEFIBRILLATOR (ICD) GENERATOR CHANGE N/A 04/09/2020   Procedure: MEDTRONIC BIV;ONE GEN CHANGE; EXTRACT RIGHT ATRIAL LEAD; REIMPLANT  RIGHT ATRIAL LEAD;ADD LEFT BUNDLE PACING LEAD;  Surgeon: Marinus Maw, MD;  Location: [facility] OR;  Service: Cardiovascular;  Laterality: N/A;   IMPLANTABLE CARDIOVERTER DEFIBRILLATOR IMPLANT  2009; 2015   MDT dual chamber right sided device implanted 2009 with gen change 2015; unable to place LV lead    Current Outpatient Medications  Medication Sig Dispense Refill   acetaminophen (TYLENOL) 325 MG tablet Take 1-2 tablets (325-650 mg total) by mouth every 4 (four) hours as needed for mild pain.     aspirin EC 81 MG tablet Take 1 tablet (81 mg total) by mouth daily. Please don't restart until after your Wound check on 4/22. 30 tablet 11   atorvastatin (LIPITOR) 40 MG tablet Take 1 tablet (40 mg total) by mouth at bedtime. 90 tablet 2   azaTHIOprine (IMURAN) 50 MG tablet Take 150 mg by mouth daily.     carvedilol (COREG) 3.125 MG tablet Take 1 tablet (3.125 mg total) by mouth 2 (two) times daily with a meal. Please make overdue appt with Dr. Graciela Husbands. 2nd attempt 30 tablet 0   furosemide (LASIX) 40 MG tablet Take 1.5 tablets (60 mg total) by mouth daily. 135 tablet 3   Multiple Vitamin (MULTIVITAMIN) capsule Take 1 capsule by mouth daily.     Nutritional Supplements (GRAPESEED EXTRACT PO) Take 2 capsules by mouth daily.     omeprazole (PRILOSEC) 40 MG capsule Take 40 mg by mouth daily.     OVER THE COUNTER MEDICATION Take 1 tablet by mouth daily. Blue green algae     OVER THE COUNTER MEDICATION Take 2 tablets by mouth daily. Digestive enzymes     Polyvinyl Alcohol-Povidone (REFRESH OP) Place 1 drop into both eyes daily as needed (red eyes).     potassium chloride SA (K-DUR,KLOR-CON) 20 MEQ tablet TAKE 1 TABLET BY MOUTH  DAILY ( MUST KEEP UPCOMING APPOINTMENT FOR FURTHER REFILLS) 90 tablet 3   predniSONE (DELTASONE) 5 MG tablet Take 5 mg by mouth every other day.     sacubitril-valsartan (ENTRESTO) 49-51 MG Take 1 tablet by mouth 2 (two) times daily.     sotalol (BETAPACE) 120 MG tablet Take 1 tablet by  mouth two times daily. Patient is overdue for an appointment and needs to call and schedule for further refills 2nd attempt 30 tablet 0   No current facility-administered medications for this visit.    Allergies  Allergen Reactions   Procainamide Other (See Comments)    Allergy is from [facility] records - pt is not aware of any reaction to this medicine   Quinidine Other (See Comments)    Allergy is from [facility] records - pt is not aware of any reaction to this medicine    Review of Systems negative except from HPI and PMH  Physical Exam BP 100/62  Pulse 80   Ht 5\' 8"  (1.727 m)   Wt 169 lb (76.7 kg)   SpO2 96%   BMI 25.70 kg/m  Well developed and well nourished in no acute distress HENT normal Neck supple with JVP-flat Clear Device pocket well healed; without hematoma or erythema.  There is no tethering  Regular rate and rhythm, no   gallop No / murmur Abd-soft with active BS No Clubbing cyanosis  edema Skin-warm and dry A & Oriented  Grossly normal sensory and motor function  ECG sinus at 80 Normal is 14/14/44 with a QRS in lead I and a rS in lead V1 Preupgrade QRSd 195 ms  Device function is normal. Programming changes noen  See Paceart for details   Assessment and  Plan   Ischemic and nonischemic cardiomyopathy  Ventricular tachycardia  Complete heart block with CRT pacing  Chronic systolic heart failure  \High Risk Medication Surveillance  Implantable defibrillator Medtronic    Hypertension   Significantly improved   With his cardiomyopathy, we will continue Entresto carvedilol  No intercurrent ventricular tachycardia continue sotalol.  Blood pressure is well-controlled on his GDMT

## 2022-08-05 NOTE — Patient Instructions (Signed)

## 2022-10-07 ENCOUNTER — Ambulatory Visit (INDEPENDENT_AMBULATORY_CARE_PROVIDER_SITE_OTHER): Payer: 59

## 2022-10-07 DIAGNOSIS — I255 Ischemic cardiomyopathy: Secondary | ICD-10-CM

## 2022-10-07 LAB — CUP PACEART REMOTE DEVICE CHECK
Battery Remaining Longevity: 64 mo
Battery Voltage: 2.98 V
Brady Statistic AP VP Percent: 6.99 %
Brady Statistic AP VS Percent: 0.01 %
Brady Statistic AS VP Percent: 92.99 %
Brady Statistic AS VS Percent: 0 %
Brady Statistic RA Percent Paced: 6.82 %
Brady Statistic RV Percent Paced: 99.98 %
Date Time Interrogation Session: 20241004073724
HighPow Impedance: 42 Ohm
HighPow Impedance: 50 Ohm
Implantable Lead Connection Status: 753985
Implantable Lead Connection Status: 753985
Implantable Lead Connection Status: 753985
Implantable Lead Implant Date: 20090619
Implantable Lead Implant Date: 20220407
Implantable Lead Implant Date: 20220407
Implantable Lead Location: 753858
Implantable Lead Location: 753859
Implantable Lead Location: 753860
Implantable Lead Model: 4398
Implantable Lead Model: 5076
Implantable Lead Model: 7120
Implantable Pulse Generator Implant Date: 20220407
Lead Channel Impedance Value: 145.871
Lead Channel Impedance Value: 149.625
Lead Channel Impedance Value: 156.471
Lead Channel Impedance Value: 174.595
Lead Channel Impedance Value: 180 Ohm
Lead Channel Impedance Value: 266 Ohm
Lead Channel Impedance Value: 266 Ohm
Lead Channel Impedance Value: 323 Ohm
Lead Channel Impedance Value: 342 Ohm
Lead Channel Impedance Value: 380 Ohm
Lead Channel Impedance Value: 380 Ohm
Lead Channel Impedance Value: 399 Ohm
Lead Channel Impedance Value: 399 Ohm
Lead Channel Impedance Value: 513 Ohm
Lead Channel Impedance Value: 532 Ohm
Lead Channel Impedance Value: 570 Ohm
Lead Channel Impedance Value: 589 Ohm
Lead Channel Impedance Value: 627 Ohm
Lead Channel Pacing Threshold Amplitude: 0.875 V
Lead Channel Pacing Threshold Amplitude: 0.875 V
Lead Channel Pacing Threshold Amplitude: 0.875 V
Lead Channel Pacing Threshold Pulse Width: 0.4 ms
Lead Channel Pacing Threshold Pulse Width: 0.4 ms
Lead Channel Pacing Threshold Pulse Width: 0.4 ms
Lead Channel Sensing Intrinsic Amplitude: 3.625 mV
Lead Channel Sensing Intrinsic Amplitude: 3.625 mV
Lead Channel Sensing Intrinsic Amplitude: 7.125 mV
Lead Channel Sensing Intrinsic Amplitude: 7.125 mV
Lead Channel Setting Pacing Amplitude: 1.5 V
Lead Channel Setting Pacing Amplitude: 1.75 V
Lead Channel Setting Pacing Amplitude: 2 V
Lead Channel Setting Pacing Pulse Width: 0.4 ms
Lead Channel Setting Pacing Pulse Width: 0.4 ms
Lead Channel Setting Sensing Sensitivity: 0.3 mV
Zone Setting Status: 755011
Zone Setting Status: 755011

## 2022-10-18 NOTE — Progress Notes (Signed)
Remote ICD transmission.

## 2022-10-28 ENCOUNTER — Encounter: Payer: Self-pay | Admitting: Internal Medicine

## 2022-10-28 ENCOUNTER — Ambulatory Visit: Payer: 59 | Attending: Internal Medicine | Admitting: Internal Medicine

## 2022-10-28 VITALS — BP 102/65 | HR 74 | Temp 97.8°F | Ht 68.0 in | Wt 166.0 lb

## 2022-10-28 DIAGNOSIS — M65342 Trigger finger, left ring finger: Secondary | ICD-10-CM

## 2022-10-28 DIAGNOSIS — R7303 Prediabetes: Secondary | ICD-10-CM

## 2022-10-28 DIAGNOSIS — Z1211 Encounter for screening for malignant neoplasm of colon: Secondary | ICD-10-CM | POA: Diagnosis not present

## 2022-10-28 DIAGNOSIS — Z23 Encounter for immunization: Secondary | ICD-10-CM | POA: Diagnosis not present

## 2022-10-28 DIAGNOSIS — M65331 Trigger finger, right middle finger: Secondary | ICD-10-CM

## 2022-10-28 DIAGNOSIS — D4989 Neoplasm of unspecified behavior of other specified sites: Secondary | ICD-10-CM

## 2022-10-28 DIAGNOSIS — I255 Ischemic cardiomyopathy: Secondary | ICD-10-CM

## 2022-10-28 DIAGNOSIS — G7000 Myasthenia gravis without (acute) exacerbation: Secondary | ICD-10-CM | POA: Diagnosis not present

## 2022-10-28 DIAGNOSIS — Z7952 Long term (current) use of systemic steroids: Secondary | ICD-10-CM | POA: Diagnosis not present

## 2022-10-28 LAB — POCT GLYCOSYLATED HEMOGLOBIN (HGB A1C): HbA1c, POC (prediabetic range): 6.1 % (ref 5.7–6.4)

## 2022-10-28 LAB — GLUCOSE, POCT (MANUAL RESULT ENTRY): POC Glucose: 127 mg/dL — AB (ref 70–99)

## 2022-10-28 NOTE — Patient Instructions (Addendum)
Call [facility] to schedule your colonoscopy. Referral was already sent [facility] #:   [phone] Address: [phone]

## 2022-10-28 NOTE — Progress Notes (Signed)
Patient ID: [name], male    DOB: Nov 25, 1950  MRN: [id number]  CC: Follow-up (Follow-up. /Trigger finger on R & L hand/Requesting blood work/No to flu vax. Instructed pt to call to sched colonoscopy)   Subjective: [name] is a 72 y.o. male who presents for chronic ds management. His concerns today include:  Patient with history of ICM (EF 40% 07/2019) with ICD placement, HFrEF, Hx AV block complete, V. tach, myasthenia   Systolic CHF/ICM/ICD in placed: Brings lab request from Dr. Sharyn Lull - CMP, A1C, lipid and Vit D.  Never dx with Vit D def Compliant with atorvastatin 40 mg daily carvedilol 3.125 mg twice a day, sotalol 120 mg BID, furosemide 60 mg daily, potassium supplement and Entresto 49/51 1 tablet twice a day.  No CP/SOB/LE edema.  No firing of device.  PreDM:   Results for orders placed or performed in visit on 10/28/22  POCT glycosylated hemoglobin (Hb A1C)  Result Value Ref Range   Hemoglobin A1C     HbA1c POC (<> result, manual entry)     HbA1c, POC (prediabetic range) 6.1 5.7 - 6.4 %   HbA1c, POC (controlled diabetic range)    POCT glucose (manual entry)  Result Value Ref Range   POC Glucose 127 (A) 70 - 99 mg/dl  doing well wit eating habits; cut out a lot of the fried foods, slaty foods, sugary things.  Drinks a lot of water. Goes bowling 3x/wk  Myasthenia gravis: Still on Imuran and prednisone 5 mg daily through his cardiologist.  On last visit he indicated that he had an upcoming appointment with neurology at [facility]. Did not go because of transportation issues.  Relies on niece for transportation and her car was out of services.  Plans to reschedule  Still having issues with trigger finger LT ring finger and now RT middle finger.  Afraid to go back to ortho because he did not like the injections which were very painful.  Can still use hands and bowls.  Uses Voltaren gel which helps some.  HM:  changes his mind; will take flu shot.  Referred to [facility]  for colonoscopy.  They have not been able to reach him via phone or mail. Patient Active Problem List   Diagnosis Date Noted   NICM (nonischemic cardiomyopathy) (HCC) 08/02/2022   Prediabetes 02/19/2022   Chronic systolic (congestive) heart failure (HCC) 04/09/2020   Hyperlipidemia 04/22/2013   CHF (congestive heart failure) (HCC) 04/22/2013   Cardiac defibrillator in place 04/22/2013   Myasthenia gravis associated with thymoma (HCC) 04/22/2013   Seasonal and perennial allergic rhinitis 09/04/2011   Allergic conjunctivitis 09/04/2011   Midsternal chest pain 08/17/2011   Automatic implantable cardioverter-defibrillator in situ 01/18/2011   FATIGUE 08/18/2009   Chronic systolic CHF (congestive heart failure) (HCC) 05/08/2008   MYASTHENIA GRAVIS WITHOUT EXACERBATION 12/05/2007   CORONARY ATHEROSCLEROSIS NATIVE CORONARY ARTERY 12/05/2007   Ischemic cardiomyopathy 12/05/2007   AV BLOCK, COMPLETE 12/05/2007   VENTRICULAR TACHYCARDIA 12/05/2007     Current Outpatient Medications on File Prior to Visit  Medication Sig Dispense Refill   acetaminophen (TYLENOL) 325 MG tablet Take 1-2 tablets (325-650 mg total) by mouth every 4 (four) hours as needed for mild pain.     aspirin EC 81 MG tablet Take 1 tablet (81 mg total) by mouth daily. Please don't restart until after your Wound check on 4/22. 30 tablet 11   atorvastatin (LIPITOR) 40 MG tablet Take 1 tablet (40 mg total) by mouth at  bedtime. 90 tablet 2   azaTHIOprine (IMURAN) 50 MG tablet Take 150 mg by mouth daily.     carvedilol (COREG) 3.125 MG tablet Take 1 tablet (3.125 mg total) by mouth 2 (two) times daily with a meal. Please make overdue appt with Dr. Graciela Husbands. 2nd attempt 30 tablet 0   furosemide (LASIX) 40 MG tablet Take 1.5 tablets (60 mg total) by mouth daily. 135 tablet 3   Multiple Vitamin (MULTIVITAMIN) capsule Take 1 capsule by mouth daily.     Nutritional Supplements (GRAPESEED EXTRACT PO) Take 2 capsules by mouth daily.      omeprazole (PRILOSEC) 40 MG capsule Take 40 mg by mouth daily.     OVER THE COUNTER MEDICATION Take 1 tablet by mouth daily. Blue green algae     OVER THE COUNTER MEDICATION Take 2 tablets by mouth daily. Digestive enzymes     Polyvinyl Alcohol-Povidone (REFRESH OP) Place 1 drop into both eyes daily as needed (red eyes).     potassium chloride SA (K-DUR,KLOR-CON) 20 MEQ tablet TAKE 1 TABLET BY MOUTH  DAILY ( MUST KEEP UPCOMING APPOINTMENT FOR FURTHER REFILLS) 90 tablet 3   predniSONE (DELTASONE) 5 MG tablet Take 5 mg by mouth every other day.     sacubitril-valsartan (ENTRESTO) 49-51 MG Take 1 tablet by mouth 2 (two) times daily.     sotalol (BETAPACE) 120 MG tablet Take 1 tablet by mouth two times daily. Patient is overdue for an appointment and needs to call and schedule for further refills 2nd attempt 30 tablet 0   No current facility-administered medications on file prior to visit.    Allergies  Allergen Reactions   Procainamide Other (See Comments)    Allergy is from [facility] records - pt is not aware of any reaction to this medicine   Quinidine Other (See Comments)    Allergy is from [facility] records - pt is not aware of any reaction to this medicine    Social History   Socioeconomic History   Marital status: Single    Spouse name: Not on file   Number of children: Not on file   Years of education: Not on file   Highest education level: Not on file  Occupational History   Not on file  Tobacco Use   Smoking status: Former    Current packs/day: 0.00    Average packs/day: 0.3 packs/day for 6.0 years (1.8 ttl pk-yrs)    Types: Cigarettes    Start date: 01/04/1976    Quit date: 01/03/1982    Years since quitting: 40.8    Passive exposure: Never   Smokeless tobacco: Never  Vaping Use   Vaping status: Never Used  Substance and Sexual Activity   Alcohol use: Yes    Comment: occasional   Drug use: No   Sexual activity: Not on file  Other Topics Concern   Not on file   Social History Narrative   Not on file   Social Determinants of Health   Financial Resource Strain: Low Risk  (10/28/2022)   Overall Financial Resource Strain (CARDIA)    Difficulty of Paying Living Expenses: Not hard at all  Food Insecurity: No Food Insecurity (10/28/2022)   Hunger Vital Sign    Worried About Running Out of Food in the Last Year: Never true    Ran Out of Food in the Last Year: Never true  Transportation Needs: No Transportation Needs (10/28/2022)   PRAPARE - Administrator, Civil Service (Medical): No  Lack of Transportation (Non-Medical): No  Physical Activity: Insufficiently Active (10/28/2022)   Exercise Vital Sign    Days of Exercise per Week: 3 days    Minutes of Exercise per Session: 30 min  Stress: No Stress Concern Present (10/28/2022)   [employer] of Occupational Health - Occupational Stress Questionnaire    Feeling of Stress : Not at all  Social Connections: Moderately Integrated (10/28/2022)   Social Connection and Isolation Panel [NHANES]    Frequency of Communication with Friends and Family: More than three times a week    Frequency of Social Gatherings with Friends and Family: More than three times a week    Attends Religious Services: More than 4 times per year    Active Member of Clubs or Organizations: Yes    Attends Banker Meetings: Never    Marital Status: Divorced  Catering manager Violence: Not At Risk (10/28/2022)   Humiliation, Afraid, Rape, and Kick questionnaire    Fear of Current or Ex-Partner: No    Emotionally Abused: No    Physically Abused: No    Sexually Abused: No    Family History  Problem Relation Age of Onset   Heart disease Mother    Heart disease Father    Coronary artery disease Brother    Hypertension Brother    Diabetes Neg Hx    Stroke Neg Hx     Past Surgical History:  Procedure Laterality Date   anterior lumbar interbody fusion  04/23/2007   L4-L5    BI-VENTRICULAR  IMPLANTABLE CARDIOVERTER DEFIBRILLATOR UPGRADE N/A 04/10/2013   failed CRTD upgrade   BI-VENTRICULAR PACEMAKER UPGRADE N/A 10/14/2013   failed CRTD upgrade   CARDIAC DEFIBRILLATOR PLACEMENT  2001; 2008   single chamber MDT ICD implanted for secondary prevention; gen change 2008; explantation 2009   CORONARY ANGIOPLASTY WITH STENT PLACEMENT     bare metal stenting of the LAD in 1997   GALLBLADDER SURGERY     IMPLANTABLE CARDIOVERTER DEFIBRILLATOR (ICD) GENERATOR CHANGE N/A 04/09/2020   Procedure: MEDTRONIC BIV;ONE GEN CHANGE; EXTRACT RIGHT ATRIAL LEAD; REIMPLANT RIGHT ATRIAL LEAD;ADD LEFT BUNDLE PACING LEAD;  Surgeon: Marinus Maw, MD;  Location: [facility] OR;  Service: Cardiovascular;  Laterality: N/A;   IMPLANTABLE CARDIOVERTER DEFIBRILLATOR IMPLANT  2009; 2015   MDT dual chamber right sided device implanted 2009 with gen change 2015; unable to place LV lead    ROS: Review of Systems Negative except as stated above  PHYSICAL EXAM: BP 102/65 (BP Location: Left Arm, Patient Position: Sitting, Cuff Size: Normal)   Pulse 74   Temp 97.8 F (36.6 C) (Oral)   Ht 5\' 8"  (1.727 m)   Wt 166 lb (75.3 kg)   SpO2 98%   BMI 25.24 kg/m   Physical Exam   General appearance - alert, well appearing, and in no distress Mental status - normal mood, behavior, speech, dress, motor activity, and thought processes Neck - supple, no significant adenopathy Chest - clear to auscultation, no wheezes, rales or rhonchi, symmetric air entry Heart - normal rate, regular rhythm, normal S1, S2, no murmurs, rubs, clicks or gallops Musculoskeletal - hands: Grip 5/5 bilaterally. Extremities - peripheral pulses normal, no pedal edema, no clubbing or cyanosis     Latest Ref Rng & Units 02/18/2022   11:41 AM 10/18/2021   10:30 AM 04/03/2020    8:53 AM  CMP  Glucose 70 - 99 mg/dL 244  010  272   BUN 8 - 27 mg/dL 14  16  10   Creatinine 0.76 - 1.27 mg/dL 1.61  0.96  0.45   Sodium 134 - 144 mmol/L 139  140  139    Potassium 3.5 - 5.2 mmol/L 4.3  4.6  4.3   Chloride 96 - 106 mmol/L 102  101  100   CO2 20 - 29 mmol/L 24  25  25    Calcium 8.6 - 10.2 mg/dL 8.5  9.2  9.3   Total Protein 6.0 - 8.5 g/dL 7.2  7.6    Total Bilirubin 0.0 - 1.2 mg/dL 0.4  0.3    Alkaline Phos 44 - 121 IU/L 88  103    AST 0 - 40 IU/L 23  21    ALT 0 - 44 IU/L 16  18     Lipid Panel     Component Value Date/Time   CHOL 174 10/18/2021 1030   TRIG 87 10/18/2021 1030   HDL 50 10/18/2021 1030   CHOLHDL 3.5 10/18/2021 1030   CHOLHDL 2.6 08/14/2015 0836   VLDL 18 08/14/2015 0836   LDLCALC 108 (H) 10/18/2021 1030    CBC    Component Value Date/Time   WBC 5.6 02/18/2022 1141   WBC 5.9 07/08/2014 1330   RBC 4.23 02/18/2022 1141   RBC 4.19 (L) 07/08/2014 1330   HGB 13.2 02/18/2022 1141   HCT 39.2 02/18/2022 1141   PLT 209 02/18/2022 1141   MCV 93 02/18/2022 1141   MCH 31.2 02/18/2022 1141   MCH 31.3 07/08/2014 1330   MCHC 33.7 02/18/2022 1141   MCHC 32.8 07/08/2014 1330   RDW 12.3 02/18/2022 1141   LYMPHSABS 1.7 04/03/2020 0853   MONOABS 0.4 02/17/2014 1240   EOSABS 0.2 04/03/2020 0853   BASOSABS 0.1 04/03/2020 0853    ASSESSMENT AND PLAN: 1. Ischemic cardiomyopathy Clinically stable.  Continue  atorvastatin 40 mg daily carvedilol 3.125 mg twice a day, sotalol 120 mg BID, furosemide 60 mg daily, potassium supplement and Entresto 49/51 1 tablet twice a day.  - Lipid panel - Comprehensive metabolic panel  2. Myasthenia gravis associated with thymoma (HCC) On prednisone 5 mg on Imuran being prescribed by his cardiologist.  I strongly encouraged him to reschedule the appointment with [facility] neurology and follow through with being seen.  We will check chemistry today and forward results to Dr. Sharyn Lull.  3. Prediabetes Still in range for prediabetes.  Commended him on healthy eating habits.  Continue regular exercise - POCT glycosylated hemoglobin (Hb A1C) - POCT glucose (manual entry)  4. Trigger ring finger of  left hand 5. Trigger middle finger of right hand Discussed referring him to orthopedic hand specialist.  If he does not want the injections anymore, the other option would be surgical.  Patient wants to hold off for now.  6. On prednisone therapy - VITAMIN D 25 Hydroxy (Vit-D Deficiency, Fractures)  7. Screening for colon cancer Provided him with the phone number for [facility] gastroenterology on his discharge summary.  Encouraged him to call them to schedule his colonoscopy.  . Encounter for immunization - Flu Vaccine Trivalent High Dose (Fluad)     Patient was given the opportunity to ask questions.  Patient verbalized understanding of the plan and was able to repeat key elements of the plan.   This documentation was completed using Paediatric nurse.  Any transcriptional errors are unintentional.  Orders Placed This Encounter  Procedures   Flu Vaccine Trivalent High Dose (Fluad)   Lipid panel   Comprehensive metabolic panel  VITAMIN D 25 Hydroxy (Vit-D Deficiency, Fractures)   POCT glycosylated hemoglobin (Hb A1C)   POCT glucose (manual entry)     Requested Prescriptions    No prescriptions requested or ordered in this encounter    Return in about 4 months (around 02/28/2023).  Jonah Blue, MD, FACP

## 2022-10-29 LAB — VITAMIN D 25 HYDROXY (VIT D DEFICIENCY, FRACTURES): Vit D, 25-Hydroxy: 45.3 ng/mL (ref 30.0–100.0)

## 2022-10-29 LAB — LIPID PANEL
Chol/HDL Ratio: 2.9 ratio (ref 0.0–5.0)
Cholesterol, Total: 161 mg/dL (ref 100–199)
HDL: 55 mg/dL (ref >39)
LDL Chol Calc (NIH): 87 mg/dL (ref 0–99)
Triglycerides: 103 mg/dL (ref 0–149)
VLDL Cholesterol Cal: 19 mg/dL (ref 5–40)

## 2022-10-29 LAB — COMPREHENSIVE METABOLIC PANEL
ALT: 20 [IU]/L (ref 0–44)
AST: 21 [IU]/L (ref 0–40)
Albumin: 4.1 g/dL (ref 3.8–4.8)
Alkaline Phosphatase: 83 [IU]/L (ref 44–121)
BUN/Creatinine Ratio: 11 (ref 10–24)
BUN: 11 mg/dL (ref 8–27)
Bilirubin Total: 0.7 mg/dL (ref 0.0–1.2)
CO2: 29 mmol/L (ref 20–29)
Calcium: 9.1 mg/dL (ref 8.6–10.2)
Chloride: 101 mmol/L (ref 96–106)
Creatinine, Ser: 0.99 mg/dL (ref 0.76–1.27)
Globulin, Total: 3.1 g/dL (ref 1.5–4.5)
Glucose: 110 mg/dL — ABNORMAL HIGH (ref 70–99)
Potassium: 4.4 mmol/L (ref 3.5–5.2)
Sodium: 140 mmol/L (ref 134–144)
Total Protein: 7.2 g/dL (ref 6.0–8.5)
eGFR: 80 mL/min/{1.73_m2} (ref >59)

## 2022-10-29 NOTE — Progress Notes (Signed)
Let pt know that his liver and kidney function tests are good.  Vitamin D level is normal.  Cholesterol level has improved from 108 a year ago to 87.  Gold is to be less than 55.  Dr. Marni Griffon may increase his atorvastatin from 40 mg to 80 mg.  I will forward results to him.   Please fax copy of labs to his cardiologist Dr. Marni Griffon.

## 2023-01-06 ENCOUNTER — Ambulatory Visit (INDEPENDENT_AMBULATORY_CARE_PROVIDER_SITE_OTHER): Payer: 59

## 2023-01-06 DIAGNOSIS — I255 Ischemic cardiomyopathy: Secondary | ICD-10-CM | POA: Diagnosis not present

## 2023-01-06 LAB — CUP PACEART REMOTE DEVICE CHECK
Battery Remaining Longevity: 59 mo
Battery Voltage: 2.98 V
Brady Statistic AP VP Percent: 8.39 %
Brady Statistic AP VS Percent: 0.01 %
Brady Statistic AS VP Percent: 91.6 %
Brady Statistic AS VS Percent: 0 %
Brady Statistic RA Percent Paced: 8.1 %
Brady Statistic RV Percent Paced: 99.99 %
Date Time Interrogation Session: 20250103073726
HighPow Impedance: 37 Ohm
HighPow Impedance: 41 Ohm
Implantable Lead Connection Status: 753985
Implantable Lead Connection Status: 753985
Implantable Lead Connection Status: 753985
Implantable Lead Implant Date: 20090619
Implantable Lead Implant Date: 20220407
Implantable Lead Implant Date: 20220407
Implantable Lead Location: 753858
Implantable Lead Location: 753859
Implantable Lead Location: 753860
Implantable Lead Model: 4398
Implantable Lead Model: 5076
Implantable Lead Model: 7120
Implantable Pulse Generator Implant Date: 20220407
Lead Channel Impedance Value: 141.867
Lead Channel Impedance Value: 141.867
Lead Channel Impedance Value: 149.625
Lead Channel Impedance Value: 160.941
Lead Channel Impedance Value: 160.941
Lead Channel Impedance Value: 247 Ohm
Lead Channel Impedance Value: 266 Ohm
Lead Channel Impedance Value: 304 Ohm
Lead Channel Impedance Value: 304 Ohm
Lead Channel Impedance Value: 323 Ohm
Lead Channel Impedance Value: 342 Ohm
Lead Channel Impedance Value: 342 Ohm
Lead Channel Impedance Value: 399 Ohm
Lead Channel Impedance Value: 456 Ohm
Lead Channel Impedance Value: 456 Ohm
Lead Channel Impedance Value: 532 Ohm
Lead Channel Impedance Value: 570 Ohm
Lead Channel Impedance Value: 589 Ohm
Lead Channel Pacing Threshold Amplitude: 0.75 V
Lead Channel Pacing Threshold Amplitude: 0.875 V
Lead Channel Pacing Threshold Amplitude: 0.875 V
Lead Channel Pacing Threshold Pulse Width: 0.4 ms
Lead Channel Pacing Threshold Pulse Width: 0.4 ms
Lead Channel Pacing Threshold Pulse Width: 0.4 ms
Lead Channel Sensing Intrinsic Amplitude: 3.375 mV
Lead Channel Sensing Intrinsic Amplitude: 3.375 mV
Lead Channel Sensing Intrinsic Amplitude: 7.125 mV
Lead Channel Sensing Intrinsic Amplitude: 7.125 mV
Lead Channel Setting Pacing Amplitude: 1.25 V
Lead Channel Setting Pacing Amplitude: 1.75 V
Lead Channel Setting Pacing Amplitude: 2 V
Lead Channel Setting Pacing Pulse Width: 0.4 ms
Lead Channel Setting Pacing Pulse Width: 0.4 ms
Lead Channel Setting Sensing Sensitivity: 0.3 mV
Zone Setting Status: 755011
Zone Setting Status: 755011

## 2023-01-09 ENCOUNTER — Telehealth: Payer: Self-pay | Admitting: Internal Medicine

## 2023-01-09 NOTE — Telephone Encounter (Signed)
 Patient has dropped off a renewal form for his Handicapped placard.  I am placing this form in Dr. Odessa Fleming box to sign off on.  Please call the patient when completed.  Thank you.

## 2023-01-09 NOTE — Telephone Encounter (Addendum)
 Handicap Disability Parking Placard completed and placed with front desk.  Attempted to notify pt.  Unable to leave voicemail message as voicemail has not been set up.

## 2023-01-10 NOTE — Telephone Encounter (Signed)
Attempted phone call to pt.  Unable to leave voicemail message as voicemail has not been set up. 

## 2023-01-12 NOTE — Telephone Encounter (Signed)
 Attempted phone call to pt.  No answer and unable to leave voicemail message as voicemail has not been set up.

## 2023-02-01 ENCOUNTER — Encounter: Payer: Self-pay | Admitting: Internal Medicine

## 2023-02-13 NOTE — Progress Notes (Signed)
 Remote ICD transmission.

## 2023-03-03 ENCOUNTER — Ambulatory Visit: Payer: 59 | Admitting: Internal Medicine

## 2023-04-07 ENCOUNTER — Ambulatory Visit (INDEPENDENT_AMBULATORY_CARE_PROVIDER_SITE_OTHER): Payer: Medicare Other

## 2023-04-07 DIAGNOSIS — I428 Other cardiomyopathies: Secondary | ICD-10-CM | POA: Diagnosis not present

## 2023-04-07 LAB — CUP PACEART REMOTE DEVICE CHECK
Battery Remaining Longevity: 53 mo
Battery Voltage: 2.97 V
Brady Statistic AP VP Percent: 10.42 %
Brady Statistic AP VS Percent: 0.01 %
Brady Statistic AS VP Percent: 89.56 %
Brady Statistic AS VS Percent: 0.02 %
Brady Statistic RA Percent Paced: 9.96 %
Brady Statistic RV Percent Paced: 99.95 %
Date Time Interrogation Session: 20250404044233
HighPow Impedance: 35 Ohm
HighPow Impedance: 39 Ohm
Implantable Lead Connection Status: 753985
Implantable Lead Connection Status: 753985
Implantable Lead Connection Status: 753985
Implantable Lead Implant Date: 20090619
Implantable Lead Implant Date: 20220407
Implantable Lead Implant Date: 20220407
Implantable Lead Location: 753858
Implantable Lead Location: 753859
Implantable Lead Location: 753860
Implantable Lead Model: 4398
Implantable Lead Model: 5076
Implantable Lead Model: 7120
Implantable Pulse Generator Implant Date: 20220407
Lead Channel Impedance Value: 133 Ohm
Lead Channel Impedance Value: 141.867
Lead Channel Impedance Value: 149.625
Lead Channel Impedance Value: 149.625
Lead Channel Impedance Value: 160.941
Lead Channel Impedance Value: 209 Ohm
Lead Channel Impedance Value: 266 Ohm
Lead Channel Impedance Value: 266 Ohm
Lead Channel Impedance Value: 304 Ohm
Lead Channel Impedance Value: 304 Ohm
Lead Channel Impedance Value: 342 Ohm
Lead Channel Impedance Value: 342 Ohm
Lead Channel Impedance Value: 380 Ohm
Lead Channel Impedance Value: 437 Ohm
Lead Channel Impedance Value: 456 Ohm
Lead Channel Impedance Value: 532 Ohm
Lead Channel Impedance Value: 570 Ohm
Lead Channel Impedance Value: 570 Ohm
Lead Channel Pacing Threshold Amplitude: 0.875 V
Lead Channel Pacing Threshold Amplitude: 1 V
Lead Channel Pacing Threshold Amplitude: 1 V
Lead Channel Pacing Threshold Pulse Width: 0.4 ms
Lead Channel Pacing Threshold Pulse Width: 0.4 ms
Lead Channel Pacing Threshold Pulse Width: 0.4 ms
Lead Channel Sensing Intrinsic Amplitude: 3.375 mV
Lead Channel Sensing Intrinsic Amplitude: 3.375 mV
Lead Channel Sensing Intrinsic Amplitude: 7.125 mV
Lead Channel Sensing Intrinsic Amplitude: 7.125 mV
Lead Channel Setting Pacing Amplitude: 1.5 V
Lead Channel Setting Pacing Amplitude: 2 V
Lead Channel Setting Pacing Amplitude: 2 V
Lead Channel Setting Pacing Pulse Width: 0.4 ms
Lead Channel Setting Pacing Pulse Width: 0.4 ms
Lead Channel Setting Sensing Sensitivity: 0.3 mV
Zone Setting Status: 755011
Zone Setting Status: 755011

## 2023-04-15 ENCOUNTER — Encounter: Payer: Self-pay | Admitting: Internal Medicine

## 2023-05-01 ENCOUNTER — Other Ambulatory Visit (HOSPITAL_BASED_OUTPATIENT_CLINIC_OR_DEPARTMENT_OTHER): Payer: Self-pay | Admitting: Pharmacist

## 2023-05-01 ENCOUNTER — Encounter: Payer: Self-pay | Admitting: Pharmacist

## 2023-05-01 DIAGNOSIS — E785 Hyperlipidemia, unspecified: Secondary | ICD-10-CM

## 2023-05-01 NOTE — Progress Notes (Signed)
 Patient appeared on insurance report for not passing the quality metrics in 2024: Medication Adherence for Cholesterol (MAC)   Outreach to the patient was Unsuccessful.  Called placed to patient's pharmacy to authorize refills. Reminder set to check next week.   Marene Shape, PharmD, Becky Bowels, CPP Clinical Pharmacist [facility] [phone]

## 2023-05-10 ENCOUNTER — Other Ambulatory Visit (HOSPITAL_BASED_OUTPATIENT_CLINIC_OR_DEPARTMENT_OTHER): Admitting: Pharmacist

## 2023-05-10 ENCOUNTER — Encounter: Payer: Self-pay | Admitting: Pharmacist

## 2023-05-10 DIAGNOSIS — E785 Hyperlipidemia, unspecified: Secondary | ICD-10-CM

## 2023-05-10 NOTE — Progress Notes (Signed)
 Patient appeared on insurance report for not passing the quality metrics in 2024: Medication Adherence for Cholesterol (MAC)   Outreach to the patient was Unsuccessful.  Called placed to patient's pharmacy and confirmed he got this on 05/01/2023 for a 90-day supply. Of note, he has not been seen since 11/2022. Message sent to the front to schedule a PCP appt.   Marene Shape, PharmD, Becky Bowels, CPP Clinical Pharmacist [facility] & [facility] [phone]

## 2023-05-15 NOTE — Progress Notes (Signed)
 Remote ICD transmission.

## 2023-07-10 ENCOUNTER — Ambulatory Visit: Payer: Medicare Other

## 2023-07-13 LAB — CUP PACEART REMOTE DEVICE CHECK
Battery Remaining Longevity: 50 mo
Battery Voltage: 2.97 V
Brady Statistic AP VP Percent: 11.78 %
Brady Statistic AP VS Percent: 0.01 %
Brady Statistic AS VP Percent: 88.21 %
Brady Statistic AS VS Percent: 0 %
Brady Statistic RA Percent Paced: 11.19 %
Brady Statistic RV Percent Paced: 99.99 %
Date Time Interrogation Session: 20250707042505
HighPow Impedance: 41 Ohm
HighPow Impedance: 50 Ohm
Implantable Lead Connection Status: 753985
Implantable Lead Connection Status: 753985
Implantable Lead Connection Status: 753985
Implantable Lead Implant Date: 20090619
Implantable Lead Implant Date: 20220407
Implantable Lead Implant Date: 20220407
Implantable Lead Location: 753858
Implantable Lead Location: 753859
Implantable Lead Location: 753860
Implantable Lead Model: 4398
Implantable Lead Model: 5076
Implantable Lead Model: 7120
Implantable Pulse Generator Implant Date: 20220407
Lead Channel Impedance Value: 156.606
Lead Channel Impedance Value: 168.889
Lead Channel Impedance Value: 168.889
Lead Channel Impedance Value: 174.595
Lead Channel Impedance Value: 190 Ohm
Lead Channel Impedance Value: 266 Ohm
Lead Channel Impedance Value: 304 Ohm
Lead Channel Impedance Value: 323 Ohm
Lead Channel Impedance Value: 380 Ohm
Lead Channel Impedance Value: 380 Ohm
Lead Channel Impedance Value: 380 Ohm
Lead Channel Impedance Value: 380 Ohm
Lead Channel Impedance Value: 380 Ohm
Lead Channel Impedance Value: 513 Ohm
Lead Channel Impedance Value: 532 Ohm
Lead Channel Impedance Value: 570 Ohm
Lead Channel Impedance Value: 589 Ohm
Lead Channel Impedance Value: 627 Ohm
Lead Channel Pacing Threshold Amplitude: 0.875 V
Lead Channel Pacing Threshold Amplitude: 0.875 V
Lead Channel Pacing Threshold Amplitude: 0.875 V
Lead Channel Pacing Threshold Pulse Width: 0.4 ms
Lead Channel Pacing Threshold Pulse Width: 0.4 ms
Lead Channel Pacing Threshold Pulse Width: 0.4 ms
Lead Channel Sensing Intrinsic Amplitude: 3.5 mV
Lead Channel Sensing Intrinsic Amplitude: 3.5 mV
Lead Channel Sensing Intrinsic Amplitude: 7.125 mV
Lead Channel Sensing Intrinsic Amplitude: 7.125 mV
Lead Channel Setting Pacing Amplitude: 1.5 V
Lead Channel Setting Pacing Amplitude: 1.75 V
Lead Channel Setting Pacing Amplitude: 2 V
Lead Channel Setting Pacing Pulse Width: 0.4 ms
Lead Channel Setting Pacing Pulse Width: 0.4 ms
Lead Channel Setting Sensing Sensitivity: 0.3 mV
Zone Setting Status: 755011
Zone Setting Status: 755011

## 2023-08-02 ENCOUNTER — Other Ambulatory Visit: Payer: Self-pay | Admitting: Pharmacist

## 2023-08-02 ENCOUNTER — Telehealth: Payer: Self-pay | Admitting: Pharmacist

## 2023-08-02 NOTE — Telephone Encounter (Signed)
 Hey friend,   Can we call this patient and attempt to schedule him with Dr. Vicci? He hasn't been seen since 10/2022. I need to send refills but I cannot authorize more until he makes an appt.

## 2023-08-02 NOTE — Progress Notes (Signed)
 Pharmacy Quality Measure Review  This patient is appearing on a report for being at risk of failing the adherence measure for cholesterol (statin) medications this calendar year.   Medication: atorvastatin  Last fill date: 05/07/2023 for 90 day supply  Insurance report was not up to date. No action needed at this time. Reminder set for next fill.   Herlene Fleeta Morris, PharmD, JAQUELINE, CPP Clinical Pharmacist [facility] [phone]

## 2023-08-03 ENCOUNTER — Encounter: Payer: Self-pay | Admitting: Internal Medicine

## 2023-08-05 ENCOUNTER — Ambulatory Visit: Payer: Self-pay | Admitting: Cardiology

## 2023-08-09 ENCOUNTER — Other Ambulatory Visit: Payer: Self-pay | Admitting: Pharmacist

## 2023-08-09 MED ORDER — ATORVASTATIN CALCIUM 40 MG PO TABS: 40.0000 mg | ORAL_TABLET | Freq: Every day | ORAL | 2 refills | Status: AC

## 2023-08-09 NOTE — Progress Notes (Signed)
 Pharmacy Quality Measure Review  This patient is appearing on a report for being at risk of failing the adherence measure for cholesterol (statin) medications this calendar year.   Medication: atorvastatin  Last fill date: 05/07/2023 for 90 day supply. Call placed to pharmacy for refill.   Herlene Fleeta Morris, PharmD, JAQUELINE, CPP Clinical Pharmacist [facility] & [facility] [phone]

## 2023-08-17 ENCOUNTER — Encounter (HOSPITAL_COMMUNITY): Payer: Self-pay

## 2023-08-17 ENCOUNTER — Telehealth: Payer: Self-pay | Admitting: Internal Medicine

## 2023-08-17 ENCOUNTER — Emergency Department (HOSPITAL_COMMUNITY)
Admission: EM | Admit: 2023-08-17 | Discharge: 2023-08-17 | Discharge status: Against Medical Advice | Attending: Emergency Medicine | Admitting: Emergency Medicine

## 2023-08-17 ENCOUNTER — Emergency Department (HOSPITAL_COMMUNITY)

## 2023-08-17 ENCOUNTER — Other Ambulatory Visit: Payer: Self-pay

## 2023-08-17 DIAGNOSIS — M25512 Pain in left shoulder: Secondary | ICD-10-CM | POA: Diagnosis not present

## 2023-08-17 DIAGNOSIS — I509 Heart failure, unspecified: Secondary | ICD-10-CM | POA: Diagnosis not present

## 2023-08-17 DIAGNOSIS — R0789 Other chest pain: Secondary | ICD-10-CM | POA: Diagnosis present

## 2023-08-17 DIAGNOSIS — Z5321 Procedure and treatment not carried out due to patient leaving prior to being seen by health care provider: Secondary | ICD-10-CM | POA: Insufficient documentation

## 2023-08-17 LAB — BASIC METABOLIC PANEL WITH GFR
Anion gap: 8 (ref 5–15)
BUN: 17 mg/dL (ref 8–23)
CO2: 25 mmol/L (ref 22–32)
Calcium: 8.6 mg/dL — ABNORMAL LOW (ref 8.9–10.3)
Chloride: 105 mmol/L (ref 98–111)
Creatinine, Ser: 0.96 mg/dL (ref 0.61–1.24)
GFR, Estimated: >60 mL/min (ref >60)
Glucose, Bld: 123 mg/dL — ABNORMAL HIGH (ref 70–99)
Potassium: 3.7 mmol/L (ref 3.5–5.1)
Sodium: 138 mmol/L (ref 135–145)

## 2023-08-17 LAB — CBC
HCT: 39.1 % (ref 39.0–52.0)
Hemoglobin: 12.8 g/dL — ABNORMAL LOW (ref 13.0–17.0)
MCH: 31.2 pg (ref 26.0–34.0)
MCHC: 32.7 g/dL (ref 30.0–36.0)
MCV: 95.4 fL (ref 80.0–100.0)
Platelets: 214 K/uL (ref 150–400)
RBC: 4.1 MIL/uL — ABNORMAL LOW (ref 4.22–5.81)
RDW: 12.4 % (ref 11.5–15.5)
WBC: 4.9 K/uL (ref 4.0–10.5)
nRBC: 0 % (ref 0.0–0.2)

## 2023-08-17 LAB — TROPONIN I (HIGH SENSITIVITY): Troponin I (High Sensitivity): 10 ng/L (ref <18)

## 2023-08-17 NOTE — Telephone Encounter (Signed)
 Pt advised to go to the ED to be evaluated.

## 2023-08-17 NOTE — ED Notes (Signed)
 Pt not answering to multiple calls for registration

## 2023-08-17 NOTE — ED Notes (Signed)
Called for labs no answer

## 2023-08-17 NOTE — ED Notes (Signed)
 Pt reports having a med tronic I'm-planted defib

## 2023-08-17 NOTE — ED Provider Triage Note (Signed)
 Emergency Medicine Provider Triage Evaluation Note  [name] , a 73 y.o. male  was evaluated in triage.  Pt complains of left-sided chest pain radiating to left flank, neck.  With walking.  Began on Saturday, improved somewhat and then worsened again today.  No previous history of ACS, but does have history of heart failure with defibrillator in place. Known coronary artery atherosclerosis.  Review of Systems  Positive: Chest pain Negative: Shortness of Breath, nausea, vomiting  Physical Exam  BP 106/68 (BP Location: Right Arm)   Pulse 76   Temp 97.7 F (36.5 C)   Resp 18   Ht 5' 8 (1.727 m)   Wt 74.8 kg   SpO2 98%   BMI 25.09 kg/m  Gen:   Awake, no distress   Resp:  Normal effort  MSK:   Moves extremities without difficulty  Other:    Medical Decision Making  Medically screening exam initiated at 4:43 PM.  Appropriate orders placed.  [name] was informed that the remainder of the evaluation will be completed by another provider, this initial triage assessment does not replace that evaluation, and the importance of remaining in the ED until their evaluation is complete.  Workup initiated in triage    Rosan Sherlean DEL, [place] 08/17/23 1646

## 2023-08-17 NOTE — Telephone Encounter (Signed)
 Reason for walk-in: Walk-in Reasons: requesting to be seen today  If patient is requesting to be seen today, or if patient is having symptoms:  What symptoms are being reported (if any)?  Neck & shoulder pain, faint left aching in left arm  Route to triage pool and ensure Teams message has been sent to the Triage Walk-In chat.  3.   For medication samples, medication refills, HIM requests, appointment requests, lab-related requests, or form/record drop-off, please route to the appropriate pool.

## 2023-08-17 NOTE — ED Triage Notes (Signed)
 Pt coming in reporting chest pain on the left side.

## 2023-08-17 NOTE — ED Triage Notes (Signed)
 Pt reports L side shoulder pain and CP. Seen at PCP and sent here for further workup.

## 2023-08-29 ENCOUNTER — Telehealth: Payer: Self-pay

## 2023-08-29 NOTE — Telephone Encounter (Signed)
 Pt has recall and needs apt.

## 2023-08-29 NOTE — Telephone Encounter (Signed)
 Patient is having an endoscopy

## 2023-08-29 NOTE — Telephone Encounter (Signed)
   Pre-operative Risk Assessment    Patient Name: [name]  DOB: 1950/03/15 MRN: [id number]   Date of last office visit: 10/27/22 Date of next office visit: NA   Request for Surgical Clearance    Procedure:  Colon cancer screening  Date of Surgery:  Clearance 09/28/23                                 Surgeon:  Dr. Jerrell Sol Surgeon's Group or Practice Name:  [facility]  Phone number:  [phone] Fax number:  [phone]   Type of Clearance Requested:   - Medical  - Pharmacy:  Hold Aspirin  not indicated   Type of Anesthesia:  Propofol     Additional requests/questions:  Patient has a pacemaker  [name]   08/29/2023, 4:35 PM

## 2023-08-30 NOTE — Telephone Encounter (Signed)
   Name: [name]  DOB: [birth year]  MRN: [id number]  Primary Cardiologist: Dr. Fernande, please schedule with Jodie Passey  Chart reviewed as part of pre-operative protocol coverage. Because of Nithin L Marcantonio's past medical history and time since last visit, he will require a follow-up in-office visit in order to better assess preoperative cardiovascular risk.  Pre-op covering staff: - Please schedule appointment and call patient to inform them. If patient already had an upcoming appointment within acceptable timeframe, please add pre-op clearance to the appointment notes so provider is aware. - Please contact requesting surgeon's office via preferred method (i.e, phone, fax) to inform them of need for appointment prior to surgery.  This messag Jon Garre Broughton [place], [place]  08/30/2023, 8:18 AM

## 2023-08-30 NOTE — Telephone Encounter (Signed)
 Sent a message to EP scheduling about this patient needing IN OFFICE appt with Jodie Passey, PA-C and that the patient needs Preop clearance.

## 2023-08-30 NOTE — Telephone Encounter (Signed)
 Spoke w/ patient - he is scheduled to see Jodie on 9/12.

## 2023-08-31 NOTE — Telephone Encounter (Signed)
 Pt has appt 09/15/23 with Jodie Passey, PAC. Will add need preop clearance to appt notes.

## 2023-09-15 ENCOUNTER — Ambulatory Visit: Attending: Student | Admitting: Student

## 2023-09-15 NOTE — Progress Notes (Deleted)
  Electrophysiology Office Note:   ID:  [name], [name] 08/22/1950, MRN [id number]  Primary Cardiologist: None Electrophysiologist: Elspeth Sage, MD  {Click to update primary MD,subspecialty MD or APP then REFRESH:1}    History of Present Illness:   [name] is a 73 y.o. male with h/o chronic systolic CHF, Ischemic cardiomyopathy, CHB, s/p ICD, and CAD seen today for routine electrophysiology followup.   Since last being seen in our clinic the patient reports doing ***.  he denies chest pain, palpitations, dyspnea, PND, orthopnea, nausea, vomiting, dizziness, syncope, edema, weight gain, or early satiety.   Review of systems complete and found to be negative unless listed in HPI.   EP Information / Studies Reviewed:    EKG is not ordered today. EKG from 08/17/2023 reviewed which showed ASVP at 76 bpm       ICD Interrogation-  reviewed in detail today,  See PACEART report.  Arrhythmia/Device History ICD-Medtronic-Optivol-Carelink   Physical Exam:   VS:  There were no vitals taken for this visit.   Wt Readings from Last 3 Encounters:  08/17/23 165 lb (74.8 kg)  10/28/22 166 lb (75.3 kg)  08/05/22 169 lb (76.7 kg)     GEN: No acute distress *** NECK: No JVD; No carotid bruits CARDIAC: {EPRHYTHM:28826}, no murmurs, rubs, gallops RESPIRATORY:  Clear to auscultation without rales, wheezing or rhonchi  ABDOMEN: Soft, non-tender, non-distended EXTREMITIES:  {EDEMA LEVEL:28147::No} edema; No deformity   ASSESSMENT AND PLAN:    Chronic systolic CHF  s/p Medtronic CRT-D  euvolemic today Stable on an appropriate medical regimen Normal ICD function See Pace Art report No changes today  Cardiac Clearance for Endoscopy/Colon cancer screening   Disposition:   Follow up with {EPPROVIDERS:28135::EP Team} {EPFOLLOW UP:28173}   Signed, Ozell Prentice Passey, PA-C

## 2023-09-18 ENCOUNTER — Encounter: Payer: Self-pay | Admitting: Student

## 2023-10-09 ENCOUNTER — Ambulatory Visit: Payer: Self-pay | Admitting: Cardiology

## 2023-10-09 ENCOUNTER — Ambulatory Visit (INDEPENDENT_AMBULATORY_CARE_PROVIDER_SITE_OTHER): Payer: Medicare Other

## 2023-10-09 DIAGNOSIS — I428 Other cardiomyopathies: Secondary | ICD-10-CM | POA: Diagnosis not present

## 2023-10-09 LAB — CUP PACEART REMOTE DEVICE CHECK
Battery Remaining Longevity: 44 mo
Battery Voltage: 2.97 V
Brady Statistic AP VP Percent: 12.79 %
Brady Statistic AP VS Percent: 0.01 %
Brady Statistic AS VP Percent: 87.2 %
Brady Statistic AS VS Percent: 0.01 %
Brady Statistic RA Percent Paced: 12.08 %
Brady Statistic RV Percent Paced: 99.97 %
Date Time Interrogation Session: 20251006001803
HighPow Impedance: 39 Ohm
HighPow Impedance: 47 Ohm
Implantable Lead Connection Status: 753985
Implantable Lead Connection Status: 753985
Implantable Lead Connection Status: 753985
Implantable Lead Implant Date: 20090619
Implantable Lead Implant Date: 20220407
Implantable Lead Implant Date: 20220407
Implantable Lead Location: 753858
Implantable Lead Location: 753859
Implantable Lead Location: 753860
Implantable Lead Model: 4398
Implantable Lead Model: 5076
Implantable Lead Model: 7120
Implantable Pulse Generator Implant Date: 20220407
Lead Channel Impedance Value: 139.967
Lead Channel Impedance Value: 139.967
Lead Channel Impedance Value: 143.419
Lead Channel Impedance Value: 166.114
Lead Channel Impedance Value: 166.114
Lead Channel Impedance Value: 247 Ohm
Lead Channel Impedance Value: 247 Ohm
Lead Channel Impedance Value: 323 Ohm
Lead Channel Impedance Value: 323 Ohm
Lead Channel Impedance Value: 323 Ohm
Lead Channel Impedance Value: 342 Ohm
Lead Channel Impedance Value: 380 Ohm
Lead Channel Impedance Value: 399 Ohm
Lead Channel Impedance Value: 494 Ohm
Lead Channel Impedance Value: 513 Ohm
Lead Channel Impedance Value: 570 Ohm
Lead Channel Impedance Value: 589 Ohm
Lead Channel Impedance Value: 627 Ohm
Lead Channel Pacing Threshold Amplitude: 0.875 V
Lead Channel Pacing Threshold Amplitude: 0.875 V
Lead Channel Pacing Threshold Amplitude: 1 V
Lead Channel Pacing Threshold Pulse Width: 0.4 ms
Lead Channel Pacing Threshold Pulse Width: 0.4 ms
Lead Channel Pacing Threshold Pulse Width: 0.4 ms
Lead Channel Sensing Intrinsic Amplitude: 3.5 mV
Lead Channel Sensing Intrinsic Amplitude: 3.5 mV
Lead Channel Sensing Intrinsic Amplitude: 7.125 mV
Lead Channel Sensing Intrinsic Amplitude: 7.125 mV
Lead Channel Setting Pacing Amplitude: 1.5 V
Lead Channel Setting Pacing Amplitude: 1.75 V
Lead Channel Setting Pacing Amplitude: 2 V
Lead Channel Setting Pacing Pulse Width: 0.4 ms
Lead Channel Setting Pacing Pulse Width: 0.4 ms
Lead Channel Setting Sensing Sensitivity: 0.3 mV
Zone Setting Status: 755011
Zone Setting Status: 755011

## 2023-10-10 NOTE — Progress Notes (Signed)
 Remote ICD Transmission

## 2024-01-08 ENCOUNTER — Ambulatory Visit: Payer: Medicare Other

## 2024-01-08 DIAGNOSIS — I428 Other cardiomyopathies: Secondary | ICD-10-CM | POA: Diagnosis not present

## 2024-01-10 ENCOUNTER — Ambulatory Visit: Payer: Self-pay | Admitting: Cardiology

## 2024-01-10 ENCOUNTER — Other Ambulatory Visit: Payer: Self-pay | Admitting: Gastroenterology

## 2024-01-10 LAB — CUP PACEART REMOTE DEVICE CHECK
Battery Remaining Longevity: 40 mo
Battery Voltage: 2.95 V
Brady Statistic AP VP Percent: 13.68 %
Brady Statistic AP VS Percent: 0.01 %
Brady Statistic AS VP Percent: 86.24 %
Brady Statistic AS VS Percent: 0.07 %
Brady Statistic RA Percent Paced: 12.87 %
Brady Statistic RV Percent Paced: 99.75 %
Date Time Interrogation Session: 20260107002505
HighPow Impedance: 39 Ohm
HighPow Impedance: 48 Ohm
Implantable Lead Connection Status: 753985
Implantable Lead Connection Status: 753985
Implantable Lead Connection Status: 753985
Implantable Lead Implant Date: 20090619
Implantable Lead Implant Date: 20220407
Implantable Lead Implant Date: 20220407
Implantable Lead Location: 753858
Implantable Lead Location: 753859
Implantable Lead Location: 753860
Implantable Lead Model: 4398
Implantable Lead Model: 5076
Implantable Lead Model: 7120
Implantable Pulse Generator Implant Date: 20220407
Lead Channel Impedance Value: 141.867
Lead Channel Impedance Value: 145.871
Lead Channel Impedance Value: 149.625
Lead Channel Impedance Value: 160.941
Lead Channel Impedance Value: 166.114
Lead Channel Impedance Value: 247 Ohm
Lead Channel Impedance Value: 266 Ohm
Lead Channel Impedance Value: 304 Ohm
Lead Channel Impedance Value: 323 Ohm
Lead Channel Impedance Value: 342 Ohm
Lead Channel Impedance Value: 342 Ohm
Lead Channel Impedance Value: 380 Ohm
Lead Channel Impedance Value: 437 Ohm
Lead Channel Impedance Value: 456 Ohm
Lead Channel Impedance Value: 513 Ohm
Lead Channel Impedance Value: 532 Ohm
Lead Channel Impedance Value: 570 Ohm
Lead Channel Impedance Value: 570 Ohm
Lead Channel Pacing Threshold Amplitude: 0.875 V
Lead Channel Pacing Threshold Amplitude: 0.875 V
Lead Channel Pacing Threshold Amplitude: 1 V
Lead Channel Pacing Threshold Pulse Width: 0.4 ms
Lead Channel Pacing Threshold Pulse Width: 0.4 ms
Lead Channel Pacing Threshold Pulse Width: 0.4 ms
Lead Channel Sensing Intrinsic Amplitude: 3.375 mV
Lead Channel Sensing Intrinsic Amplitude: 3.375 mV
Lead Channel Sensing Intrinsic Amplitude: 7.125 mV
Lead Channel Sensing Intrinsic Amplitude: 7.125 mV
Lead Channel Setting Pacing Amplitude: 1.5 V
Lead Channel Setting Pacing Amplitude: 2 V
Lead Channel Setting Pacing Amplitude: 2.25 V
Lead Channel Setting Pacing Pulse Width: 0.4 ms
Lead Channel Setting Pacing Pulse Width: 0.4 ms
Lead Channel Setting Sensing Sensitivity: 0.3 mV
Zone Setting Status: 755011
Zone Setting Status: 755011

## 2024-01-11 NOTE — Progress Notes (Signed)
 Remote ICD Transmission

## 2024-01-17 ENCOUNTER — Telehealth (HOSPITAL_BASED_OUTPATIENT_CLINIC_OR_DEPARTMENT_OTHER): Payer: Self-pay

## 2024-01-17 NOTE — Telephone Encounter (Signed)
 Call x1; attempted to call patient to schedule him for overdue 1 yr f/u (due 08/2023); + preop clearance, no voicemail box therefore unable to reach patient. Text sent to patient via 4/6 remote.  Text sent to patient via 4/6 ICD Remote:  Jabre, please call [phone] to schedule 1 year follow up/pre op clearance appt for ICD with one of Dr. Retia PAs. Thanks!

## 2024-01-17 NOTE — Telephone Encounter (Signed)
"  ° °  Pre-operative Risk Assessment    Patient Name: [name]  DOB: Oct 18, 1950 MRN: [id number]   Date of last office visit: 08/05/22 with Fernande Date of next office visit: NA  Request for Surgical Clearance    Procedure:  colonoscopy and endoscopy   Date of Surgery:  Clearance 02/20/24                                 Surgeon:  Dr. Dianna Surgeon's Group or Practice Name:  [facility]  Phone number:  [phone] Fax number:  [phone]   Type of Clearance Requested:   - Medical  - Pharmacy:  Hold Aspirin  not indicated   Type of Anesthesia:  propofol     Additional requests/questions:    [name] [name] [name]   01/17/2024, 7:53 AM   "

## 2024-01-17 NOTE — Telephone Encounter (Signed)
 Pt is followed by EP. Will send a message to EP schedulers to reach out to the pt with an appt for preop clearance.

## 2024-01-17 NOTE — Telephone Encounter (Signed)
" ° °  Name: [name]  DOB: 12-17-1950  MRN: [id number]  Primary Cardiologist: None  Chart reviewed as part of pre-operative protocol coverage. Because of [name]'s past medical history and time since last visit, he will require a follow-up in-office visit in order to better assess preoperative cardiovascular risk.  Pre-op covering staff: - Please schedule appointment with EP APP and call patient to inform them. Please add pre-op clearance to the appointment notes so provider is aware. - Please contact requesting surgeon's office via preferred method (i.e, phone, fax) to inform them of need for appointment prior to surgery.  Regarding ASA therapy, we recommend continuation of ASA throughout the perioperative period.  However, if the surgeon feels that cessation of ASA is required in the perioperative period, it may be stopped 5-7 days prior to surgery with a plan to resume it as soon as felt to be feasible from a surgical standpoint in the post-operative period.  [name], NP  01/17/2024, 8:01 AM   "

## 2024-01-19 NOTE — Telephone Encounter (Signed)
 Call x2; attempted to call patient to schedule him for overdue 1 yr f/u (due 08/2023); + preop clearance, call not completed at patients primary number/call not compted at his sister Loretta's contact/was able to speak with sister Marshall - she will call her niece/patients daughter to get in touch with patient to call in to schedule.

## 2024-01-23 NOTE — Telephone Encounter (Signed)
 I will update the requesting office the pt has appt 02/19/24 [name], [facility] per the EP scheduler [name], who has also placed the pt on a wait list as well.   See previous notes .

## 2024-01-23 NOTE — Telephone Encounter (Signed)
 Sent secure chat to EP schedulers following up on EP appt as pt needs preop clearance as well as he is over due for his yrly appt.

## 2024-01-26 ENCOUNTER — Emergency Department (HOSPITAL_COMMUNITY)

## 2024-01-26 ENCOUNTER — Emergency Department (HOSPITAL_COMMUNITY)
Admission: EM | Admit: 2024-01-26 | Discharge: 2024-01-27 | Disposition: A | Discharge status: Home / Self Care | Attending: Emergency Medicine | Admitting: Emergency Medicine

## 2024-01-26 ENCOUNTER — Encounter (HOSPITAL_COMMUNITY): Payer: Self-pay | Admitting: *Deleted

## 2024-01-26 ENCOUNTER — Other Ambulatory Visit: Payer: Self-pay

## 2024-01-26 ENCOUNTER — Telehealth: Payer: Self-pay | Admitting: Internal Medicine

## 2024-01-26 DIAGNOSIS — Z955 Presence of coronary angioplasty implant and graft: Secondary | ICD-10-CM | POA: Insufficient documentation

## 2024-01-26 DIAGNOSIS — R079 Chest pain, unspecified: Secondary | ICD-10-CM | POA: Insufficient documentation

## 2024-01-26 DIAGNOSIS — I2510 Atherosclerotic heart disease of native coronary artery without angina pectoris: Secondary | ICD-10-CM | POA: Insufficient documentation

## 2024-01-26 DIAGNOSIS — Z7982 Long term (current) use of aspirin: Secondary | ICD-10-CM | POA: Diagnosis not present

## 2024-01-26 DIAGNOSIS — Z950 Presence of cardiac pacemaker: Secondary | ICD-10-CM | POA: Insufficient documentation

## 2024-01-26 LAB — BASIC METABOLIC PANEL WITH GFR
Anion gap: 8 (ref 5–15)
BUN: 19 mg/dL (ref 8–23)
CO2: 26 mmol/L (ref 22–32)
Calcium: 8.8 mg/dL — ABNORMAL LOW (ref 8.9–10.3)
Chloride: 105 mmol/L (ref 98–111)
Creatinine, Ser: 0.93 mg/dL (ref 0.61–1.24)
GFR, Estimated: >60 mL/min
Glucose, Bld: 97 mg/dL (ref 70–99)
Potassium: 3.9 mmol/L (ref 3.5–5.1)
Sodium: 139 mmol/L (ref 135–145)

## 2024-01-26 LAB — TROPONIN T, HIGH SENSITIVITY
Troponin T High Sensitivity: 22 ng/L — ABNORMAL HIGH (ref 0–19)
Troponin T High Sensitivity: 22 ng/L — ABNORMAL HIGH (ref 0–19)

## 2024-01-26 LAB — CBC
HCT: 39 % (ref 39.0–52.0)
Hemoglobin: 13 g/dL (ref 13.0–17.0)
MCH: 32.2 pg (ref 26.0–34.0)
MCHC: 33.3 g/dL (ref 30.0–36.0)
MCV: 96.5 fL (ref 80.0–100.0)
Platelets: 202 K/uL (ref 150–400)
RBC: 4.04 MIL/uL — ABNORMAL LOW (ref 4.22–5.81)
RDW: 12.4 % (ref 11.5–15.5)
WBC: 6 K/uL (ref 4.0–10.5)
nRBC: 0 % (ref 0.0–0.2)

## 2024-01-26 NOTE — ED Notes (Signed)
"  Pacemaker interrogated.  "

## 2024-01-26 NOTE — ED Triage Notes (Signed)
 Chest pain for one hour no exertion history of heart disease no sob or dizziness

## 2024-01-26 NOTE — Telephone Encounter (Signed)
Acknowledged. Thank you for the notification

## 2024-01-26 NOTE — ED Triage Notes (Addendum)
 PT presents for CP under left arm and left chest.  No SOB or NV.  Cardiac hx including pacemaker.  PT does not appear in distress at this time.

## 2024-01-26 NOTE — Telephone Encounter (Signed)
 Pt walked in to [facility] and expressed while he was getting dressed he started to experience jolts from his device. He was concerned and wanted to be seen. Spoke to management and was instructed to send pt to ED and forward message to triage.

## 2024-01-26 NOTE — ED Provider Notes (Signed)
 " [place] [facility] Provider Note   CSN: [id number] Arrival date & time: 01/26/24  1723     Patient presents with: No chief complaint on file.   [name] is a 74 y.o. male with a past medical history notable for ischemic cardiomyopathy, ventricular tachycardia status post ICD placement, CAD status post CABG who presents today for evaluation of chest pain.  Patient reports that he was in his normal state of health until approximately 4 PM today when he developed intermittent sharp shooting chest pain on the left side.  Symptoms are fleeting and last only a few seconds before resolving spontaneously.  No associated palpitations, shortness of breath, lightheadedness or syncope.  Due to his cardiac history is presenting today for further evaluation.  HPI     Prior to Admission medications  Medication Sig Start Date End Date Taking? Authorizing Provider  acetaminophen  (TYLENOL ) 325 MG tablet Take 1-2 tablets (325-650 mg total) by mouth every 4 (four) hours as needed for mild pain. 04/10/20   Lesia Ozell Barter, PA-C  aspirin  EC 81 MG tablet Take 1 tablet (81 mg total) by mouth daily. Please don't restart until after your Wound check on 4/22. 04/25/20   Lesia Ozell Barter, PA-C  atorvastatin  (LIPITOR) 40 MG tablet Take 1 tablet (40 mg total) by mouth at bedtime. 08/09/23   Louder Barnie NOVAK, MD  azaTHIOprine (IMURAN) 50 MG tablet Take 150 mg by mouth daily.    [provider]  carvedilol  (COREG ) 3.125 MG tablet Take 1 tablet (3.125 mg total) by mouth 2 (two) times daily with a meal. Please make overdue appt with Dr. Fernande. 2nd attempt 07/18/17   Seiler, Amber K, NP  furosemide  (LASIX ) 40 MG tablet Take 1.5 tablets (60 mg total) by mouth daily. 06/28/22   Louder Barnie NOVAK, MD  Multiple Vitamin (MULTIVITAMIN) capsule Take 1 capsule by mouth daily.    [provider]  Nutritional Supplements (GRAPESEED EXTRACT PO) Take 2 capsules  by mouth daily.    [provider]  omeprazole (PRILOSEC) 40 MG capsule Take 40 mg by mouth daily.    [provider]  OVER THE COUNTER MEDICATION Take 1 tablet by mouth daily. Blue green algae    [provider]  OVER THE COUNTER MEDICATION Take 2 tablets by mouth daily. Digestive enzymes    [provider]  Polyvinyl Alcohol-Povidone (REFRESH OP) Place 1 drop into both eyes daily as needed (red eyes).    [provider]  potassium chloride  SA (K-DUR,KLOR-CON ) 20 MEQ tablet TAKE 1 TABLET BY MOUTH  DAILY ( MUST KEEP UPCOMING APPOINTMENT FOR FURTHER REFILLS) 10/30/17   Fernande Elspeth BROCKS, MD  predniSONE  (DELTASONE ) 5 MG tablet Take 5 mg by mouth every other day.    [provider]  sacubitril -valsartan  (ENTRESTO ) 49-51 MG Take 1 tablet by mouth 2 (two) times daily.    [provider]  sotalol  (BETAPACE ) 120 MG tablet Take 1 tablet by mouth two times daily. Patient is overdue for an appointment and needs to call and schedule for further refills 2nd attempt 09/07/17   Fernande Elspeth BROCKS, MD    Allergies: Procainamide and Quinidine    Review of Systems  Updated Vital Signs BP 135/69 (BP Location: Right Arm)   Pulse 81   Temp 98.9 F (37.2 C)   Resp (!) 23   Ht 5' 8 (1.727 m)   Wt 74.8 kg   SpO2 97%   BMI 25.07 kg/m  Physical Exam Constitutional:      General: He is not in acute distress.    Appearance: Normal appearance.  HENT:     Head: Normocephalic.     Right Ear: External ear normal.     Left Ear: External ear normal.     Nose: Nose normal.     Mouth/Throat:     Mouth: Mucous membranes are moist.  Eyes:     Extraocular Movements: Extraocular movements intact.     Pupils: Pupils are equal, round, and reactive to light.  Cardiovascular:     Rate and Rhythm: Normal rate.     Pulses: Normal pulses.  Pulmonary:     Effort: Pulmonary effort is normal.  Abdominal:     General: Abdomen is flat.  Musculoskeletal:         General: Normal range of motion.     Cervical back: Normal range of motion.  Skin:    General: Skin is warm.     Capillary Refill: Capillary refill takes less than 2 seconds.  Neurological:     General: No focal deficit present.     Mental Status: He is alert.  Psychiatric:        Mood and Affect: Mood normal.     (all labs ordered are listed, but only abnormal results are displayed) Labs Reviewed  BASIC METABOLIC PANEL WITH GFR - Abnormal; Notable for the following components:      Result Value   Calcium  8.8 (*)    All other components within normal limits  CBC - Abnormal; Notable for the following components:   RBC 4.04 (*)    All other components within normal limits  TROPONIN T, HIGH SENSITIVITY - Abnormal; Notable for the following components:   Troponin T High Sensitivity 22 (*)    All other components within normal limits  TROPONIN T, HIGH SENSITIVITY    EKG: None  Radiology: DG Chest 2 View Result Date: 01/26/2024 CLINICAL DATA:  Mid chest pain. EXAM: CHEST - 2 VIEW COMPARISON:  August 17, 2023 FINDINGS: A multi lead right-sided AICD is seen with multiple old left-sided AICD lead wires in place. There are multiple sternal wires present. The heart size and mediastinal contours are within normal limits. Low lung volumes are noted with mild elevation of the left hemidiaphragm. Both lungs are clear. Radiopaque surgical clips are seen within the right upper quadrant. The visualized skeletal structures are unremarkable. IMPRESSION: 1. Evidence of prior median sternotomy/CABG. 2. Low lung volumes without active cardiopulmonary disease. Electronically Signed   By: Suzen Dials M.D.   On: 01/26/2024 18:39   Procedures   Medications Ordered in the ED - No data to display                                Medical Decision Making Amount and/or Complexity of Data Reviewed Labs: ordered. Radiology: ordered.   Patient is a 74 year old male who presents today for evaluation  of chest pain with significant cardiac history.  On initial assessment patient was noted to be hemodynamically stable and afebrile.  On my bedside assessment patient was noted to be resting comfortably no acute distress.  Physical examination without any acute abnormalities.  Unremarkable CBC, metabolic panel, troponin 22 and flat.  Chest x-ray without any acute findings.  EKG with atrial sensed and ventricular paced rhythm.  No evidence of ischemic changes.  During patient's time in the emergency department he did not  have any recurrence of his chest pain.  We did interrogate patient's device that did not show any acute events or discharges.  I did speak with cardiology given his elevated troponin and cardiac history and given atypical nature of his symptoms and no ongoing pain, felt that he was stable for outpatient follow-up.  Patient is agreeable with the plan moving forward.  No evidence of ACS, acute aortic pathology, pulmonary embolism, pneumonia at this point time.   Final diagnoses:  Chest pain, unspecified type    ED Discharge Orders     None          Laurita Sieving, MD 01/27/24 [id number]    Doretha Folks, MD 01/27/24 1330  "

## 2024-01-26 NOTE — ED Provider Notes (Incomplete)
 Patient is a pleasant 74 year old male with heart history status post CABG and pacemaker placement who is presenting today with intermittent sharp pain in his left chest.  No associated symptoms reports otherwise feels well.  No dyspnea on exertion.  Symptoms are not caused by exertion, eating, deep breathing or other activity.  He is otherwise well-appearing.  Patient's labs are reassuring.  Troponin is 22 x 2.  EKG with a paced rhythm.  No evidence of dysrhythmia at this time.

## 2024-01-27 NOTE — Discharge Instructions (Signed)
 Thank you letting us  take care of you today.  He came in today for evaluation of chest pain.  We did lab work that was overall reassuring.  You did have a slightly elevated heart enzyme that we did talk to the cardiologist for.  We also interrogated your device that did not show any abnormalities.  Please follow-up with your cardiologist within the near future.  Please come back if you have persistent or worsening symptoms.

## 2024-02-07 ENCOUNTER — Encounter (HOSPITAL_COMMUNITY): Payer: Self-pay | Admitting: Gastroenterology

## 2024-02-07 NOTE — Progress Notes (Signed)
 Attempted to obtain medical history via telephone, unable to reach at this time. Unable to leave voicemail to return pre surgical testing department's phone call,due to mailbox not available.

## 2024-02-19 ENCOUNTER — Ambulatory Visit: Admitting: Student

## 2024-02-20 ENCOUNTER — Encounter (HOSPITAL_COMMUNITY): Payer: Self-pay

## 2024-02-20 ENCOUNTER — Ambulatory Visit (HOSPITAL_COMMUNITY): Admit: 2024-02-20 | Admitting: Gastroenterology

## 2024-02-20 SURGERY — COLONOSCOPY
Anesthesia: Monitor Anesthesia Care
